# Patient Record
Sex: Male | Born: 1955 | Race: Black or African American | Hispanic: No | Smoking: Never smoker
Health system: Southern US, Community
[De-identification: ages and names within clinical notes are randomized; demographics above are authoritative.]

## PROBLEM LIST (undated history)

## (undated) DIAGNOSIS — E119 Type 2 diabetes mellitus without complications: Secondary | ICD-10-CM

## (undated) DIAGNOSIS — G4730 Sleep apnea, unspecified: Secondary | ICD-10-CM

## (undated) DIAGNOSIS — M109 Gout, unspecified: Secondary | ICD-10-CM

## (undated) DIAGNOSIS — I2510 Atherosclerotic heart disease of native coronary artery without angina pectoris: Secondary | ICD-10-CM

## (undated) DIAGNOSIS — I10 Essential (primary) hypertension: Secondary | ICD-10-CM

## (undated) DIAGNOSIS — E785 Hyperlipidemia, unspecified: Secondary | ICD-10-CM

## (undated) DIAGNOSIS — E079 Disorder of thyroid, unspecified: Secondary | ICD-10-CM

## (undated) DIAGNOSIS — M069 Rheumatoid arthritis, unspecified: Secondary | ICD-10-CM

## (undated) DIAGNOSIS — I639 Cerebral infarction, unspecified: Secondary | ICD-10-CM

## (undated) DIAGNOSIS — I219 Acute myocardial infarction, unspecified: Secondary | ICD-10-CM

## (undated) DIAGNOSIS — I509 Heart failure, unspecified: Secondary | ICD-10-CM

## (undated) HISTORY — PX: CORONARY ANGIOPLASTY WITH STENT PLACEMENT: SHX49

## (undated) HISTORY — DX: Sleep apnea, unspecified: G47.30

## (undated) HISTORY — DX: Hyperlipidemia, unspecified: E78.5

## (undated) HISTORY — PX: CIRCUMCISION: SUR203

## (undated) HISTORY — PX: TONSILLECTOMY: SUR1361

## (undated) HISTORY — DX: Atherosclerotic heart disease of native coronary artery without angina pectoris: I25.10

## (undated) HISTORY — DX: Heart failure, unspecified: I50.9

## (undated) HISTORY — DX: Disorder of thyroid, unspecified: E07.9

---

## 2003-12-12 ENCOUNTER — Other Ambulatory Visit: Payer: Self-pay

## 2004-07-28 ENCOUNTER — Emergency Department: Payer: Self-pay | Admitting: Emergency Medicine

## 2004-10-23 ENCOUNTER — Ambulatory Visit: Payer: Self-pay | Admitting: Family Medicine

## 2004-12-16 ENCOUNTER — Emergency Department: Payer: Self-pay | Admitting: General Practice

## 2005-03-15 ENCOUNTER — Emergency Department: Payer: Self-pay | Admitting: Emergency Medicine

## 2005-05-26 ENCOUNTER — Emergency Department: Payer: Self-pay | Admitting: General Practice

## 2005-07-05 ENCOUNTER — Emergency Department: Payer: Self-pay | Admitting: Emergency Medicine

## 2005-09-09 ENCOUNTER — Emergency Department: Payer: Self-pay | Admitting: Emergency Medicine

## 2005-09-28 ENCOUNTER — Emergency Department: Payer: Self-pay | Admitting: Emergency Medicine

## 2005-10-12 ENCOUNTER — Emergency Department: Payer: Self-pay | Admitting: General Practice

## 2005-10-14 ENCOUNTER — Emergency Department: Payer: Self-pay | Admitting: General Practice

## 2006-01-14 ENCOUNTER — Emergency Department: Payer: Self-pay | Admitting: Emergency Medicine

## 2006-01-14 ENCOUNTER — Other Ambulatory Visit: Payer: Self-pay

## 2006-02-13 ENCOUNTER — Ambulatory Visit: Payer: Self-pay | Admitting: Family Medicine

## 2006-03-05 ENCOUNTER — Emergency Department: Payer: Self-pay | Admitting: Emergency Medicine

## 2006-03-21 ENCOUNTER — Other Ambulatory Visit: Payer: Self-pay

## 2006-03-21 ENCOUNTER — Inpatient Hospital Stay: Payer: Self-pay | Admitting: Internal Medicine

## 2006-08-03 ENCOUNTER — Emergency Department: Payer: Self-pay | Admitting: Emergency Medicine

## 2007-03-01 ENCOUNTER — Inpatient Hospital Stay: Payer: Self-pay | Admitting: Unknown Physician Specialty

## 2007-03-24 ENCOUNTER — Ambulatory Visit: Payer: Self-pay | Admitting: Internal Medicine

## 2007-04-09 ENCOUNTER — Ambulatory Visit: Payer: Self-pay | Admitting: Internal Medicine

## 2007-07-20 ENCOUNTER — Emergency Department: Payer: Self-pay | Admitting: Emergency Medicine

## 2007-08-30 ENCOUNTER — Other Ambulatory Visit: Payer: Self-pay

## 2007-08-30 ENCOUNTER — Emergency Department: Payer: Self-pay | Admitting: Emergency Medicine

## 2007-09-30 ENCOUNTER — Emergency Department: Payer: Self-pay | Admitting: Emergency Medicine

## 2007-11-06 ENCOUNTER — Emergency Department: Payer: Self-pay | Admitting: Emergency Medicine

## 2008-03-16 ENCOUNTER — Other Ambulatory Visit: Payer: Self-pay

## 2008-03-16 ENCOUNTER — Emergency Department: Payer: Self-pay | Admitting: Emergency Medicine

## 2008-09-13 ENCOUNTER — Emergency Department: Payer: Self-pay | Admitting: Emergency Medicine

## 2009-08-05 ENCOUNTER — Emergency Department: Payer: Self-pay | Admitting: Emergency Medicine

## 2009-11-12 ENCOUNTER — Emergency Department: Payer: Self-pay | Admitting: Emergency Medicine

## 2010-03-28 ENCOUNTER — Emergency Department: Payer: Self-pay | Admitting: Emergency Medicine

## 2010-06-05 ENCOUNTER — Emergency Department: Payer: Self-pay | Admitting: Emergency Medicine

## 2010-07-31 ENCOUNTER — Emergency Department: Payer: Self-pay | Admitting: Emergency Medicine

## 2010-10-01 ENCOUNTER — Inpatient Hospital Stay: Payer: Self-pay | Admitting: Specialist

## 2011-01-28 ENCOUNTER — Emergency Department: Payer: Self-pay | Admitting: Emergency Medicine

## 2011-05-21 ENCOUNTER — Observation Stay: Payer: Self-pay | Admitting: Internal Medicine

## 2011-05-24 ENCOUNTER — Ambulatory Visit: Payer: Self-pay | Admitting: Internal Medicine

## 2011-07-01 ENCOUNTER — Observation Stay: Payer: Self-pay | Admitting: Internal Medicine

## 2011-08-28 ENCOUNTER — Observation Stay: Payer: Self-pay | Admitting: Specialist

## 2011-08-28 LAB — TROPONIN I
Troponin-I: <0.02 ng/mL
Troponin-I: <0.02 ng/mL

## 2011-08-28 LAB — COMPREHENSIVE METABOLIC PANEL
Albumin: 3.3 g/dL — ABNORMAL LOW (ref 3.4–5.0)
Alkaline Phosphatase: 106 U/L (ref 50–136)
Anion Gap: 7 (ref 7–16)
Co2: 28 mmol/L (ref 21–32)
Creatinine: 1.12 mg/dL (ref 0.60–1.30)
EGFR (Non-African Amer.): >60
Potassium: 4 mmol/L (ref 3.5–5.1)
Sodium: 138 mmol/L (ref 136–145)

## 2011-08-28 LAB — PROTIME-INR
INR: 0.9
Prothrombin Time: 12.6 secs (ref 11.5–14.7)

## 2011-08-28 LAB — MAGNESIUM: Magnesium: 1.6 mg/dL — ABNORMAL LOW

## 2011-08-28 LAB — CBC
HCT: 38.5 % — ABNORMAL LOW (ref 40.0–52.0)
MCV: 87 fL (ref 80–100)

## 2011-08-28 LAB — CK TOTAL AND CKMB (NOT AT ARMC): CK-MB: 2.4 ng/mL (ref 0.5–3.6)

## 2011-08-28 LAB — APTT: Activated PTT: 26.9 secs (ref 23.6–35.9)

## 2011-08-29 LAB — CBC WITH DIFFERENTIAL/PLATELET
Basophil %: 0.4 %
Eosinophil %: 3.3 %
Lymphocyte %: 25 %
MCH: 28.9 pg (ref 26.0–34.0)
Monocyte #: 0.6 10*3/uL (ref 0.0–0.7)
Neutrophil %: 61.5 %
Platelet: 213 10*3/uL (ref 150–440)

## 2011-08-29 LAB — BASIC METABOLIC PANEL
Calcium, Total: 8.4 mg/dL — ABNORMAL LOW (ref 8.5–10.1)
Co2: 28 mmol/L (ref 21–32)
EGFR (African American): >60
EGFR (Non-African Amer.): >60
Glucose: 85 mg/dL (ref 65–99)
Osmolality: 288 (ref 275–301)
Potassium: 3.7 mmol/L (ref 3.5–5.1)

## 2011-08-29 LAB — HEMOGLOBIN A1C: Hemoglobin A1C: 10 % — ABNORMAL HIGH (ref 4.2–6.3)

## 2011-08-29 LAB — CK TOTAL AND CKMB (NOT AT ARMC): CK, Total: 194 U/L (ref 35–232)

## 2011-08-29 LAB — TROPONIN I: Troponin-I: <0.02 ng/mL

## 2011-09-05 ENCOUNTER — Encounter: Payer: Self-pay | Admitting: Internal Medicine

## 2011-09-07 ENCOUNTER — Encounter: Payer: Self-pay | Admitting: Internal Medicine

## 2011-10-08 ENCOUNTER — Encounter: Payer: Self-pay | Admitting: Internal Medicine

## 2011-10-22 ENCOUNTER — Emergency Department: Payer: Self-pay | Admitting: Emergency Medicine

## 2011-10-23 ENCOUNTER — Emergency Department: Payer: Self-pay | Admitting: Emergency Medicine

## 2011-11-07 ENCOUNTER — Encounter: Payer: Self-pay | Admitting: Internal Medicine

## 2011-12-08 ENCOUNTER — Encounter: Payer: Self-pay | Admitting: Internal Medicine

## 2012-01-07 ENCOUNTER — Encounter: Payer: Self-pay | Admitting: Internal Medicine

## 2012-02-15 ENCOUNTER — Emergency Department: Payer: Self-pay | Admitting: Emergency Medicine

## 2012-06-24 ENCOUNTER — Encounter (HOSPITAL_COMMUNITY): Payer: Self-pay | Admitting: Physical Medicine and Rehabilitation

## 2012-06-24 ENCOUNTER — Emergency Department (HOSPITAL_COMMUNITY)
Admission: EM | Admit: 2012-06-24 | Discharge: 2012-06-24 | Disposition: A | Discharge status: Home / Self Care | Payer: No Typology Code available for payment source | Attending: Emergency Medicine | Admitting: Emergency Medicine

## 2012-06-24 ENCOUNTER — Emergency Department (HOSPITAL_COMMUNITY): Payer: No Typology Code available for payment source

## 2012-06-24 DIAGNOSIS — I252 Old myocardial infarction: Secondary | ICD-10-CM | POA: Insufficient documentation

## 2012-06-24 DIAGNOSIS — E119 Type 2 diabetes mellitus without complications: Secondary | ICD-10-CM | POA: Insufficient documentation

## 2012-06-24 DIAGNOSIS — Y92410 Unspecified street and highway as the place of occurrence of the external cause: Secondary | ICD-10-CM | POA: Insufficient documentation

## 2012-06-24 DIAGNOSIS — Z8639 Personal history of other endocrine, nutritional and metabolic disease: Secondary | ICD-10-CM | POA: Insufficient documentation

## 2012-06-24 DIAGNOSIS — M069 Rheumatoid arthritis, unspecified: Secondary | ICD-10-CM | POA: Insufficient documentation

## 2012-06-24 DIAGNOSIS — S39012A Strain of muscle, fascia and tendon of lower back, initial encounter: Secondary | ICD-10-CM

## 2012-06-24 DIAGNOSIS — Z862 Personal history of diseases of the blood and blood-forming organs and certain disorders involving the immune mechanism: Secondary | ICD-10-CM | POA: Insufficient documentation

## 2012-06-24 DIAGNOSIS — Z794 Long term (current) use of insulin: Secondary | ICD-10-CM | POA: Insufficient documentation

## 2012-06-24 DIAGNOSIS — Y9389 Activity, other specified: Secondary | ICD-10-CM | POA: Insufficient documentation

## 2012-06-24 DIAGNOSIS — I10 Essential (primary) hypertension: Secondary | ICD-10-CM | POA: Insufficient documentation

## 2012-06-24 DIAGNOSIS — S335XXA Sprain of ligaments of lumbar spine, initial encounter: Secondary | ICD-10-CM | POA: Insufficient documentation

## 2012-06-24 DIAGNOSIS — Z79899 Other long term (current) drug therapy: Secondary | ICD-10-CM | POA: Insufficient documentation

## 2012-06-24 HISTORY — DX: Essential (primary) hypertension: I10

## 2012-06-24 HISTORY — DX: Type 2 diabetes mellitus without complications: E11.9

## 2012-06-24 HISTORY — DX: Rheumatoid arthritis, unspecified: M06.9

## 2012-06-24 HISTORY — DX: Acute myocardial infarction, unspecified: I21.9

## 2012-06-24 HISTORY — DX: Gout, unspecified: M10.9

## 2012-06-24 MED ORDER — HYDROCODONE-ACETAMINOPHEN 5-325 MG PO TABS: 1.0000 | ORAL_TABLET | Freq: Four times a day (QID) | ORAL | Status: DC | PRN
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
## 2012-09-23 ENCOUNTER — Emergency Department: Payer: Self-pay | Admitting: Unknown Physician Specialty

## 2012-09-23 LAB — URINALYSIS, COMPLETE
Bacteria: NONE SEEN
Bilirubin,UR: NEGATIVE
Blood: NEGATIVE
Ketone: NEGATIVE
Leukocyte Esterase: NEGATIVE
RBC,UR: <1 /HPF (ref 0–5)
Specific Gravity: 1.015 (ref 1.003–1.030)
WBC UR: <1 /HPF (ref 0–5)

## 2012-09-23 LAB — COMPREHENSIVE METABOLIC PANEL
BUN: 12 mg/dL (ref 7–18)
Calcium, Total: 8.4 mg/dL — ABNORMAL LOW (ref 8.5–10.1)
Co2: 28 mmol/L (ref 21–32)
Creatinine: 1.03 mg/dL (ref 0.60–1.30)
EGFR (African American): >60
EGFR (Non-African Amer.): >60
Glucose: 235 mg/dL — ABNORMAL HIGH (ref 65–99)
Osmolality: 285 (ref 275–301)
SGOT(AST): 21 U/L (ref 15–37)
SGPT (ALT): 28 U/L (ref 12–78)
Sodium: 139 mmol/L (ref 136–145)
Total Protein: 7.1 g/dL (ref 6.4–8.2)

## 2012-09-23 LAB — CBC
HCT: 40 % (ref 40.0–52.0)
HGB: 13.2 g/dL (ref 13.0–18.0)
MCH: 27.6 pg (ref 26.0–34.0)
RBC: 4.78 10*6/uL (ref 4.40–5.90)
RDW: 14.6 % — ABNORMAL HIGH (ref 11.5–14.5)
WBC: 6.8 10*3/uL (ref 3.8–10.6)

## 2012-09-23 LAB — CK TOTAL AND CKMB (NOT AT ARMC): CK, Total: 309 U/L — ABNORMAL HIGH (ref 35–232)

## 2014-03-05 ENCOUNTER — Emergency Department: Payer: Self-pay | Admitting: Emergency Medicine

## 2014-05-03 ENCOUNTER — Emergency Department: Payer: Self-pay | Admitting: Emergency Medicine

## 2014-05-03 LAB — CBC
HCT: 41.9 % (ref 40.0–52.0)
HGB: 13.6 g/dL (ref 13.0–18.0)
MCH: 28.3 pg (ref 26.0–34.0)
MCHC: 32.5 g/dL (ref 32.0–36.0)
MCV: 87 fL (ref 80–100)
Platelet: 263 10*3/uL (ref 150–440)
RBC: 4.82 10*6/uL (ref 4.40–5.90)
RDW: 13.7 % (ref 11.5–14.5)
WBC: 7 10*3/uL (ref 3.8–10.6)

## 2014-05-03 LAB — COMPREHENSIVE METABOLIC PANEL
ALBUMIN: 3.4 g/dL (ref 3.4–5.0)
ALK PHOS: 93 U/L
Anion Gap: 6 — ABNORMAL LOW (ref 7–16)
BUN: 13 mg/dL (ref 7–18)
Bilirubin,Total: 0.2 mg/dL (ref 0.2–1.0)
CALCIUM: 8.4 mg/dL — AB (ref 8.5–10.1)
CREATININE: 1.15 mg/dL (ref 0.60–1.30)
Chloride: 107 mmol/L (ref 98–107)
Co2: 27 mmol/L (ref 21–32)
EGFR (African American): >60
EGFR (Non-African Amer.): >60
GLUCOSE: 155 mg/dL — AB (ref 65–99)
Osmolality: 283 (ref 275–301)
POTASSIUM: 4.2 mmol/L (ref 3.5–5.1)
SGOT(AST): 27 U/L (ref 15–37)
SGPT (ALT): 33 U/L
SODIUM: 140 mmol/L (ref 136–145)
Total Protein: 7.3 g/dL (ref 6.4–8.2)

## 2014-05-03 LAB — SALICYLATE LEVEL

## 2014-05-03 LAB — ETHANOL: Ethanol: <3 mg/dL

## 2014-05-03 LAB — ACETAMINOPHEN LEVEL

## 2014-10-30 NOTE — Consult Note (Signed)
PATIENT NAME:  Derrick Barajas, Derrick Barajas MR#:  [id number] DATE OF BIRTH:  1956/03/07  DATE OF CONSULTATION:  05/03/2014  CONSULTING PHYSICIAN:  Audery Amel, MD   IDENTIFYING INFORMATION AND REASON FOR CONSULT:  A 59 year old man came to the Emergency Room with a complaint of being frustrated and depressed.   HISTORY OF PRESENT ILLNESS: Information obtained from the patient and the chart. The patient reports that his mood feels down, nervous and anxious much of the time. He has been feeling "unappreciated" by everyone around him, especially for the last week and the last couple of days in particular.  Two major stresses he reports are that his extended family keeps pestering him to give them free cab rides and that his girlfriend and he have a frustrating relationship around finances in the house. He feels like she does not appreciate him either. He denies that he has had any suicidal ideation. Denies that he has had any sleep problems. Denies that he has had any appetite problems. He is not reporting any specific physical symptoms. He has still been able to do his work regularly. No new health problems. Not currently taking any medicine for depression.   PAST PSYCHIATRIC HISTORY: Many years ago, by his estimate more than 10, he had a suicide attempt by [place] the railroad tracks.  He was picked up and taken to [facility] and prescribed antidepressants, but cannot remember what they were. Has not seen an outpatient psychiatrist in years. No further suicide attempts or hospitalizations since then.   SUBSTANCE ABUSE HISTORY: Denies that he drinks or abuses any drugs or has had a past history of drinking or abusing any drugs.   FAMILY HISTORY: His mother had depression which was bad enough to be hospitalized.   PAST MEDICAL HISTORY: Several medical problems including diabetes requiring insulin, high blood pressure, gout, congestive heart failure. He has had a heart attack in the past as well.    SOCIAL HISTORY: The patient lives with his girlfriend. They have been together for about 10 years.  They still do not share all their finances and he complains that she takes his groceries but will not let him touch any of hers. He does not have any children of his own.  He works as a Scientist, physiological and feels like his family do not understand that he cannot be driving them around for free when he is at work.   LABORATORY RESULTS: Glucose elevated at 155, calcium low at 8.4. Chemistry is otherwise unremarkable. CBC all normal. Acetaminophen and salicylates negative. We do not have a urinalysis or drug screen back.    VITAL SIGNS: Blood pressure 156/100, respirations 18, pulse 88, temperature 97.5.   REVIEW OF SYSTEMS:  Depressed mood. Frustration. No suicidal ideation. Occasional thoughts of hitting someone without any plan or intent. Physically, no complaints. Full 9-point review of systems negative.   MEDICATIONS: Aspirin 81 mg twice a day, Accupril 20 mg once a day, nitroglycerin sublingual tablets as needed for chest pain, Humalog on a sliding scale 3 times a day, Lantus insulin 110 units at night, metformin 1000 mg twice a day, loratadine 10 mg once a day, Effient 10 mg once a day, metoprolol 50 mg twice a day, amlodipine 10 mg once a day, Lasix 20 mg a day, docusate 100 mg once a day, Patanol eye drops as needed.   MENTAL STATUS EXAMINATION:  Reasonably well-groomed gentleman who looks his stated age, cooperative with the interview. Eye contact good. Psychomotor  activity normal. Speech normal rate, tone and volume perhaps a little bit quiet and nervous.  Affect, a little bit tearful, but not near full.  He shows appropriate reactivity.  Thoughts are lucid.  No evidence of loosening of associations or delusions. Denies auditory or visual hallucinations. Denies suicidal or homicidal ideation. Can recall 3/3 objects immediately and 2/3 at 3 minutes. Judgment and insight intact. Normal intelligence.    ASSESSMENT: A 59 year old man who has depressive symptoms, possibly major depression versus adjustment disorder. The fact that he is bothered enough to come into the hospital and that he had a past history of significant depression makes me prefer the diagnosis of major depression. The patient at this point is not acutely dangerous, does not require hospital level treatment.   TREATMENT PLAN: I proposed to him starting Celexa 20 mg once a day. Side effects and purposes of medicine discussed.   He is agreeable to the plan.  He will be given a referral to go to [facility] to follow up with them.   DIAGNOSIS, PRINCIPAL AND PRIMARY:  AXIS I: Major depression, moderate, recurrent.   SECONDARY DIAGNOSES:  AXIS I: No further.  AXIS II: No diagnosis.  AXIS III: High blood pressure, diabetes, gout, congestive heart failure, status post myocardial infarction, coronary artery disease.  AXIS V: Global Assessment of Functioning at time of evaluation 55.      ____________________________ Audery Amel, MD jtc:DT D: 05/03/2014 13:16:43 ET T: 05/03/2014 14:13:29 ET JOB#: [id number]  cc: Audery Amel, MD, <Dictator> Audery Amel MD ELECTRONICALLY SIGNED 05/15/2014 14:51

## 2014-10-31 NOTE — Consult Note (Signed)
PATIENT NAME:  Derrick Barajas, Derrick Barajas MR#:  [id number] DATE OF BIRTH:  10-Apr-1956  DATE OF CONSULTATION:  08/29/2011  REFERRING PHYSICIAN:  [facility] / Malachy Moan, MD CONSULTING PHYSICIAN:  Jakyia Gaccione D. Iisha Soyars, MD  REASON FOR CONSULTATION: Symptoms of left-sided chest pain, unstable angina, and shortness of breath.   HISTORY OF PRESENT ILLNESS: The patient is a 59 year old black male with history of coronary artery disease status post angioplasty and stent to the LAD in November 2012. He has hypertension, hyperlipidemia, diabetes, and obesity who complained of left-sided chest pain which started earlier. He reports symptoms lasted about 10 minutes and has had several nitroglycerins which seemed like they did not help. He had radiation of pain to the left arm with numbness. Symptoms have improved. He describes pressure sensation with throbbing. He reports that since his cardiac procedure, he has had intermittent symptoms. He states while at dinner he started having chest pain and may have had a syncopal episode and was brought to the Emergency Room for evaluation.  REVIEW OF SYSTEMS: Possible blackout spells. Possible syncope. No nausea or vomiting. No fever, no chills, and no sweats. No weight loss and no weight gain. No hemoptysis or hematemesis. No bright red blood per rectum.   PAST MEDICAL HISTORY:  1. Hypertension. 2. Hyperlipidemia. 3. Coronary artery disease.  4. Diabetes.   FAMILY HISTORY: Diabetes and coronary artery disease.   SOCIAL HISTORY: Denies smoking or alcohol consumption. Works as a Media planner.   MEDICATIONS:  1. Accupril 20 mg daily.  2. Aspirin 81 mg a day.  3. Plavix 75 mg a day.  4. Dulcolax 100 mg daily.  5. Lantus 110 units subcutaneous at bedtime.  6. Metoprolol tartrate 50 mg twice a day.  7. Nitroglycerin sublingual p.r.n.  8. NovoLog 18 units three times daily.  ALLERGIES: Solu-Medrol and sulfa.   PHYSICAL EXAMINATION:   VITAL SIGNS: Blood pressure  120/60, pulse 75, respiratory rate 18, afebrile.   HEENT: Normocephalic, atraumatic. Pupils are equal and reactive to light.   NECK: Supple. No significant jugular venous distention, bruits or adenopathy.   LUNGS: Clear to auscultation and percussion. No significant wheeze, rhonchi, or rales.   HEART: Regular rate and rhythm. Positive S4. Systolic ejection murmur at the apex.   ABDOMEN: Benign.   EXTREMITIES: Within normal limits.   NEUROLOGIC: Intact.   SKIN: Normal.   LABS/STUDIES: Glucose 267, BUN 12, creatinine 1.12, sodium 138, potassium 4.0. CK 55, MB 2.4, troponin 0.02. White count 5.9, hemoglobin 12.7, platelet count 226. INR negative.   Chest x-ray negative.   EKG: Normal sinus rhythm, nonspecific ST-T changes.   ASSESSMENT:  1. Unstable angina.  2. Angina.  3. Coronary artery disease.  4. Obesity.  5. Hypertension.  6. Hyperlipidemia.  7. Status post angioplasty and stent.   PLAN: Agree with admit. Rule out for myocardial infarction. Place on telemetry. Consider functional study. Continue blood pressure control. Continue ACE inhibitor. Continue statin therapy. Recommend weight loss and exercise. Continue diabetes management. Base further evaluation on results of functional study. ____________________________ Bobbie Stack Juliann Pares, MD ddc:slb D: 08/30/2011 09:29:00 ET T: 08/30/2011 10:06:14 ET JOB#: [id number]  cc: Derrick Lonigro D. Juliann Pares, MD, <Dictator> Alwyn Pea MD ELECTRONICALLY SIGNED 09/25/2011 10:04

## 2014-10-31 NOTE — Discharge Summary (Signed)
PATIENT NAME:  Derrick Barajas, Derrick Barajas MR#:  [id number] DATE OF BIRTH:  23-Mar-1956  DATE OF ADMISSION:  08/28/2011 DATE OF DISCHARGE:  08/29/2011  For a detailed note, please take a look at the history and physical done on admission by Dr. Auburn Bilberry.    DIAGNOSES AT DISCHARGE:  1. Chest pain, likely related to anxiety/panic attack. 2. History of coronary artery disease, status post stent placement. 3. Hypertension. 4. Diabetes. 5. Obesity.   DIET: Patient was discharged on a low sodium, American Diabetic Association diet.   ACTIVITY: As tolerated.   FOLLOW UP: Follow up with Dr. Juliann Pares next 1 to 2 weeks.   DISCHARGE MEDICATIONS:  1. Metoprolol tartrate 50 mg b.i.d.  2. Accupril 20 mg daily.  3. Aspirin 81 mg daily. 4. NovoLog 18 units t.i.d. with meals.  5. Lantus 110 units at bedtime.  6. Sublingual nitroglycerin as needed.  7. Plavix 75 mg daily.  8. Colace 100 mg daily.   LABORATORY, DIAGNOSTIC AND RADIOLOGICAL DATA: Pertinent studies done during the hospital course are as follows: Chest x-ray done on admission showing no acute cardiopulmonary disease. A stress test on the day after admission showing no evidence of any acute myocardial ischemia with normal LV function.   HOSPITAL COURSE: This is a 59 year old male with medical problems as mentioned above presented to the hospital secondary to chest pain.  1. Chest pain. Given the patient's history of diabetes, obesity, coronary disease with recent stent placement he was observed overnight on telemetry, had three sets of cardiac markers checked which were negative. He also underwent a stress test which did not show any evidence of acute myocardial ischemia. His chest pain was likely related to panic attack and anxiety. He was therefore discharged home on his maintenance medications.  2. Diabetes. Patient is likely noncompliant with his medications. His hemoglobin A1c was as high as 10. This likely needs to be further monitored by his  primary care physician. He is currently on NovoLog 18 units t.i.d. with meals and 110 of Lantus and he will resume that upon discharge.  3. Hypertension. He remained hemodynamically stable on his metoprolol tartrate and Accupril which he will resume upon discharge.  4. History of coronary artery disease status post stent placement. Patient is already on maintenance medications including aspirin, Plavix, beta blockers, and statin and he will resume that upon discharge.  5. CODE STATUS: Patient is a FULL CODE.   TIME SPENT: 35 minutes.  ____________________________ Rolly Pancake. Cherlynn Kaiser, MD vjs:cms D: 08/30/2011 15:18:56 ET T: 08/31/2011 08:50:02 ET JOB#: [id number]  cc: Rolly Pancake. Cherlynn Kaiser, MD, <Dictator> Dwayne D. Juliann Pares, MD Houston Siren MD ELECTRONICALLY SIGNED 08/31/2011 16:11

## 2014-10-31 NOTE — H&P (Signed)
PATIENT NAME:  Derrick Barajas, WURZER MR#:  [id number] DATE OF BIRTH:  [birth year]  DATE OF ADMISSION:  08/28/2011  PRIMARY CARE PHYSICIAN: [facility]  REFERRING PHYSICIAN: Malachy Moan, MD  CHIEF COMPLAINT: Left-sided chest pressure, shortness of breath.   HISTORY OF PRESENT ILLNESS: The patient is a 59 year old African American male with history of coronary artery disease who had a stent to the LAD in November 2012 and has hypertension, hyperlipidemia, and poorly controlled diabetes who presents to the ED with complaint of having left-sided chest pressure which he reports started earlier this morning. He reports the symptoms lasted about 10 minutes. He had to take two sublingual nitroglycerins. He also had some pain radiating to his left arm with numbness. Now his symptoms have resolved. He describes the pressure sensation as a throbbing type of sensation. He reports that since his cardiac catheterization he has had intermittent symptoms with pain but usually last a minute or so. This time it was longer. Therefore, he decided to come to the ED. In the ER, his EKG shows no acute abnormality. His cardiac enzymes are negative. He otherwise denies any orthopnea or nocturnal dyspnea, no lower extremity swelling, no calf pain, and no fevers, chills, or cough.   PAST MEDICAL HISTORY:  1. Hypertension.  2. Hyperlipidemia.  3. History of coronary artery disease with stent to the LAD, placed in November 2012.  4. Diabetes.  DRUG ALLERGIES: Solu-Medrol and sulfa.   CURRENT MEDICATIONS:  1. Accupril 20 mg daily.  2. Aspirin 81 mg one tab p.o. daily.  3. Plavix 75 mg p.o. daily.  4. Docusate 100 mg one tab p.o. daily.  5. Lantus 110 units subcutaneous at bedtime.  6. Metoprolol tartrate 50 mg one tab p.o. twice a day.  7. Nitroglycerin 0.4 mg sublingual p.r.n. chest pain. 8. NovoLog 18 units three times daily before a meal.   SOCIAL HISTORY: The patient does not smoke and does not drink; no drugs.    FAMILY HISTORY: Both mother and father had diabetes and died from a heart problem.  REVIEW OF SYSTEMS: CONSTITUTIONAL: Denies any fevers, fatigue, or weakness. No pain. No weight loss or weight gain. EYES: No blurred or double vision. No pain. No redness. No inflammation. No glaucoma. No cataracts. ENT: No tinnitus. No ear pain or hearing loss. No seasonal or year-round allergies. No epistaxis. No nasal discharge. No snoring. No postnasal drip. No difficulty swallowing. RESPIRATORY: No cough. No wheezing. No hemoptysis. No chronic obstructive pulmonary disease. No tuberculosis. No pneumonia. CARDIOVASCULAR: Chest pressure as above. No orthopnea. No edema. No arrhythmia. No palpitations. No syncope. GASTROINTESTINAL: No nausea, vomiting, or diarrhea. No abdominal pain. No hematemesis. No melena. No ulcer. No gastroesophageal reflux disease. No changes in bowel habits. GENITOURINARY: Denies any dysuria, hematuria, renal calculus, or frequency. ENDOCRINE: Denies any polyuria, nocturia, or thyroid problems. Denies any increased sweating, heat or cold intolerance. HEME/LYMPH: Denies any anemia, easy bruisability, or bleeding. SKIN: No acne. No rash. No changes in mole, hair, or skin. MUSCULOSKELETAL: Denies any pain in the neck, back, or shoulder. NEURO: No numbness. No cerebrovascular accident. No transient ischemic attack. PSYCHIATRIC: No anxiety. No insomnia. No ADD.   PHYSICAL EXAMINATION:   VITAL SIGNS: Temperature 98, pulse 76, respirations 20, blood pressure 118/58, O2 saturation 97%.   GENERAL: The patient appears comfortable, in no acute distress.   HEENT: Pupils are equally round and reactive to light and accommodation. Extraocular movements intact. Oropharynx is clear.  No conjunctival pallor. No scleral icterus.  NECK: No thyromegaly. No carotid bruits. No JVD.  HEART: Regular rate and rhythm. No murmurs, rubs, clicks, or gallops. PMI is not displaced.   LUNGS: Clear to auscultation  bilaterally without any rales, rhonchi, or wheezing.   ABDOMEN: Soft, nontender, and nondistended. Positive bowel sounds x4.   EXTREMITIES: No clubbing, cyanosis, or edema.   NEUROLOGIC: Awake, alert, and oriented x3. No focal deficits.   SKIN: No rash.   LYMPHATICS: No lymph nodes are palpable.   MUSCULOSKELETAL: No erythema or swelling.   VASCULAR: Good DP and PT pulses.   PERTINENT LABS AND EVALUATIONS: Glucose 267, BUN 12, creatinine 1.12, sodium 138, potassium 4.0, chloride 103, CO2 28, calcium 8.4. Magnesium slightly low at 1.6. LFTs showed albumin of 3.3. CPK was elevated to 55. CK-MB 2.4. Troponin less than 0.02. WBC 5.9, hemoglobin 12.7, and platelet count was 229. INR 0.9.  Chest x-ray shows no acute abnormality.   EKG shows normal sinus rhythm without any acute ST-T wave changes.   ASSESSMENT AND PLAN: The patient is a 59 year old African American male with history of coronary artery disease with stent in November 2012. The patient presents with recurrent chest pain. He was admitted in December with similar symptoms.  1. Chest pain, possible cardiac at this time: The patient does have a history of coronary artery disease. EKG and enzymes are unrevealing. We will consult Dr. Juliann Pares for any further recommendations. I will go ahead and schedule him for stress test. Continue his aspirin, beta blocker, and p.r.n. nitroglycerin.  2. Hypertension: We will continue Accupril and metoprolol, follow blood pressure, and adjust his regimen as needed.  3. Diabetes, type 2, which is poorly controlled: We will continue Lantus premeal, NovoLog, and sliding scale insulin.  4. Hyperlipidemia: Currently not on any treatment. We will check a fasting lipid panel for the morning.     5. Miscellaneous:  We will place him on Lovenox for deep vein thrombosis prophylaxis.  TIME SPENT: 35 minutes. ____________________________ Lacie Scotts Allena Katz, MD shp:slb D: 08/28/2011 15:00:35 ET T: 08/28/2011  15:23:25 ET JOB#: [id number]  cc: Ty Buntrock H. Allena Katz, MD, <Dictator> [facility] Charise Carwin MD ELECTRONICALLY SIGNED 09/14/2011 7:49

## 2014-11-11 ENCOUNTER — Encounter (INDEPENDENT_AMBULATORY_CARE_PROVIDER_SITE_OTHER): Payer: Self-pay

## 2015-02-09 ENCOUNTER — Emergency Department
Admission: EM | Admit: 2015-02-09 | Discharge: 2015-02-09 | Disposition: A | Discharge status: Home / Self Care | Payer: Medicaid Other | Attending: Emergency Medicine | Admitting: Emergency Medicine

## 2015-02-09 ENCOUNTER — Emergency Department: Payer: Medicaid Other

## 2015-02-09 ENCOUNTER — Other Ambulatory Visit: Payer: Self-pay

## 2015-02-09 DIAGNOSIS — Z79899 Other long term (current) drug therapy: Secondary | ICD-10-CM | POA: Diagnosis not present

## 2015-02-09 DIAGNOSIS — R609 Edema, unspecified: Secondary | ICD-10-CM | POA: Diagnosis not present

## 2015-02-09 DIAGNOSIS — R079 Chest pain, unspecified: Secondary | ICD-10-CM | POA: Diagnosis present

## 2015-02-09 DIAGNOSIS — Z9861 Coronary angioplasty status: Secondary | ICD-10-CM | POA: Diagnosis not present

## 2015-02-09 DIAGNOSIS — Z794 Long term (current) use of insulin: Secondary | ICD-10-CM | POA: Diagnosis not present

## 2015-02-09 DIAGNOSIS — I10 Essential (primary) hypertension: Secondary | ICD-10-CM | POA: Insufficient documentation

## 2015-02-09 DIAGNOSIS — I252 Old myocardial infarction: Secondary | ICD-10-CM | POA: Diagnosis not present

## 2015-02-09 DIAGNOSIS — E119 Type 2 diabetes mellitus without complications: Secondary | ICD-10-CM | POA: Diagnosis not present

## 2015-02-09 HISTORY — DX: Cerebral infarction, unspecified: I63.9

## 2015-02-09 LAB — COMPREHENSIVE METABOLIC PANEL
ALT: 47 U/L (ref 17–63)
AST: 36 U/L (ref 15–41)
Albumin: 3.7 g/dL (ref 3.5–5.0)
Alkaline Phosphatase: 91 U/L (ref 38–126)
Anion gap: 8 (ref 5–15)
BUN: 14 mg/dL (ref 6–20)
CO2: 25 mmol/L (ref 22–32)
Calcium: 8.8 mg/dL — ABNORMAL LOW (ref 8.9–10.3)
Chloride: 105 mmol/L (ref 101–111)
Creatinine, Ser: 0.96 mg/dL (ref 0.61–1.24)
GFR calc Af Amer: >60 mL/min (ref >60)
GFR calc non Af Amer: >60 mL/min (ref >60)
Glucose, Bld: 178 mg/dL — ABNORMAL HIGH (ref 65–99)
Potassium: 3.9 mmol/L (ref 3.5–5.1)
Sodium: 138 mmol/L (ref 135–145)
Total Bilirubin: 0.4 mg/dL (ref 0.3–1.2)
Total Protein: 6.8 g/dL (ref 6.5–8.1)

## 2015-02-09 LAB — CBC WITH DIFFERENTIAL/PLATELET
Basophils Absolute: 0 10*3/uL (ref 0–0.1)
Basophils Relative: 1 %
Eosinophils Absolute: 0.2 10*3/uL (ref 0–0.7)
Eosinophils Relative: 4 %
HCT: 39.9 % — ABNORMAL LOW (ref 40.0–52.0)
Hemoglobin: 13.4 g/dL (ref 13.0–18.0)
Lymphocytes Relative: 27 %
Lymphs Abs: 1.4 10*3/uL (ref 1.0–3.6)
MCH: 28.6 pg (ref 26.0–34.0)
MCHC: 33.6 g/dL (ref 32.0–36.0)
MCV: 85 fL (ref 80.0–100.0)
Monocytes Absolute: 0.5 10*3/uL (ref 0.2–1.0)
Monocytes Relative: 11 %
Neutro Abs: 2.8 10*3/uL (ref 1.4–6.5)
Neutrophils Relative %: 57 %
Platelets: 213 10*3/uL (ref 150–440)
RBC: 4.7 MIL/uL (ref 4.40–5.90)
RDW: 13.6 % (ref 11.5–14.5)
WBC: 5 10*3/uL (ref 3.8–10.6)

## 2015-02-09 LAB — TROPONIN I
Troponin I: <0.03 ng/mL (ref <0.031)
Troponin I: <0.03 ng/mL (ref <0.031)

## 2015-02-09 LAB — APTT: APTT: 25 s (ref 24–36)

## 2015-02-09 LAB — PROTIME-INR
INR: 1.02
Prothrombin Time: 13.6 seconds (ref 11.4–15.0)

## 2015-02-09 NOTE — ED Provider Notes (Signed)
-----------------------------------------   3:10 PM on 02/09/2015 -----------------------------------------  This patient was primarily seen by nurse practitioner Renford Dills. I was available throughout his stay for consultation and Ms. Hyacinth Meeker and I discussed the case including the plan.  This patient has a cardiac history. He has had chest pain starting at 8:30 this morning.  After his initial troponin level, which was negative, Ms. Hyacinth Meeker spoke with the patient's cardiologist. They advise a second troponin and if negative the patient may be discharged. They will see him tomorrow in their office.   ED ECG REPORT I, Felix Pratt W, the attending physician, personally viewed and interpreted this ECG contemporaneously at 10:10 AM.   Date: 02/09/2015  EKG Time: 9:56 AM  Rate: 85  Rhythm: Normal sinus rhythm  Axis: Normal  Intervals: Normal  ST&T Change: nonspecific T-wave abnormality-some flat T waves in lead 3 and lead V5 through V6   Darien Ramus, MD 02/09/15 1513

## 2015-02-09 NOTE — ED Provider Notes (Signed)
[facility] Emergency Department Provider Note  ____________________________________________  Time seen: Approximately 10:28 AM  I have reviewed the triage vital signs and the nursing notes.   HISTORY  Chief Complaint Chest Pain   HPI Derrick Barajas is a 59 y.o. male with previous history of MI 2 years ago as well as with x 1 cardiac stent, presents to the ER with onset of chest pain at 8:30 this morning. Patient reports that chest pain onset was while sitting still, states chest pain started in left chest as a stabbing chest pain that radiated into his left arm that felt like a tingling sensation. With some nausea and weakness. He reports pain at peak was 7 out of 10 in last for 10 minutes. Patient states that pain "eased up " after EMS administered sublingual nitroglycerin. Patient states that he now has a 2 out of 10 intermittent stabbing left chest pain. Patient reports that this is similar to his previous MI. 324 mg aspirin administered by EMS.   Patient reports that he occasionally experiences chest pain when anxious, but states this was different only as it lasted longer. States that pain normally lasts only 1-2 minutes and quickly goes away. States he has been experiencing large amounts of stress but states that is chronic.   Patient denies recent sickness. Denies fever, fall or injury.    Past Medical History  Diagnosis Date  . MI (myocardial infarction)   . Hypertension   . Diabetes mellitus without complication   . Gout   . RA (rheumatoid arthritis)   . Stroke     left facal droop  Angina   There are no active problems to display for this patient.   No past surgical history on file.  Current Outpatient Rx  Name  Route  Sig  Dispense  Refill  . amLODipine (NORVASC) 10 MG tablet   Oral   Take 10 mg by mouth daily.         . furosemide (LASIX) 20 MG tablet   Oral   Take 20 mg by mouth 2 (two) times daily.         .           .  insulin glargine (LANTUS) 100 UNIT/ML injection   Subcutaneous   Inject 110-200 Units into the skin at bedtime.         . insulin lispro (HUMALOG) 100 UNIT/ML injection   Subcutaneous   Inject 12 Units into the skin 3 (three) times daily before meals.         . metoprolol succinate (TOPROL-XL) 50 MG 24 hr tablet   Oral   Take 50 mg by mouth 2 (two) times daily. Take with or immediately following a meal.         . prasugrel (EFFIENT) 10 MG TABS   Oral   Take by mouth.         . quinapril (ACCUPRIL) 20 MG tablet   Oral   Take 20 mg by mouth at bedtime.         . rosuvastatin (CRESTOR) 40 MG tablet   Oral   Take 40 mg by mouth daily.           Allergies Iodine  No family history on file.  Social History History  Substance Use Topics  . Smoking status: Never Smoker   . Smokeless tobacco: Not on file  . Alcohol Use: No    Review of Systems Constitutional: No fever/chills Eyes: No  visual changes. ENT: No sore throat. Cardiovascular: positive for chest pain. Respiratory: Denies shortness of breath. Gastrointestinal: No abdominal pain.  No nausea, no vomiting.  No diarrhea.  No constipation. Genitourinary: Negative for dysuria. Musculoskeletal: Negative for back pain. Skin: Negative for rash. Neurological: Negative for headaches, focal weakness or numbness.  10-point ROS otherwise negative.  ____________________________________________   PHYSICAL EXAM:  VITAL SIGNS: ED Triage Vitals  Enc Vitals Group     BP 02/09/15 1000 145/91 mmHg     Pulse Rate 02/09/15 1000 83     Resp 02/09/15 1000 14     Temp 02/09/15 1002 97.6 F (36.4 C)     Temp Source 02/09/15 1002 Oral     SpO2 02/09/15 1000 98 %     Weight 02/09/15 1002 286 lb (129.729 kg)     Height 02/09/15 1002 5\' 7"  (1.702 m)     Head Cir --      Peak Flow --      Pain Score 02/09/15 1005 5     Pain Loc --      Pain Edu? --      Excl. in GC? --    Today's Vitals   02/09/15 1330 02/09/15  1345 02/09/15 1400 02/09/15 1415  BP: 135/88  136/90   Pulse: 72 70 79 79  Temp:      TempSrc:      Resp: 13 11 13 15   Height:      Weight:      SpO2: 95% 97% 97% 98%  PainSc:       Constitutional: Alert and oriented. Well appearing and in no acute distress. Eyes: Conjunctivae are normal. PERRL. EOMI. Head: Atraumatic.  Nose: No congestion/rhinnorhea.  Mouth/Throat: Mucous membranes are moist.  Oropharynx non-erythematous. Neck: No stridor.  No cervical spine tenderness to palpation. Hematological/Lymphatic/Immunilogical: No cervical lymphadenopathy. Cardiovascular: Normal rate, regular rhythm. Grossly normal heart sounds.  Good peripheral circulation. Respiratory: Normal respiratory effort.  No retractions. Lungs CTAB. Gastrointestinal: Soft and nontender. Obese abdomen. Normal Bowel sounds.  No abdominal bruits. No CVA tenderness. Musculoskeletal: No lower or upper extremity tenderness.  No joint effusions. Bilateral pedal pulses equal and easily palpated with Mild bilateral lower extremity edema, nonpitting.  Neurologic:  Normal speech and language. No gross focal neurologic deficits are appreciated. No gait instability. Skin:  Skin is warm, dry and intact. No rash noted. Psychiatric: Mood and affect are normal. Speech and behavior are normal.  ____________________________________________   LABS (all labs ordered are listed, but only abnormal results are displayed)  Labs Reviewed  COMPREHENSIVE METABOLIC PANEL - Abnormal; Notable for the following:    Glucose, Bld 178 (*)    Calcium 8.8 (*)    All other components within normal limits  CBC WITH DIFFERENTIAL/PLATELET - Abnormal; Notable for the following:    HCT 39.9 (*)    All other components within normal limits  TROPONIN I  PROTIME-INR  APTT  TROPONIN I   ____________________________________________  EKG  See Dr 04/11/15 dictation ____________________________________________  RADIOLOGY  EXAM: CHEST 2  VIEW  COMPARISON: 09/23/2012  FINDINGS: Cardiomediastinal silhouette is stable. Mild elevation of the right hemidiaphragm again noted. No acute infiltrate or pleural effusion. No pulmonary edema. Minimal degenerative changes mid thoracic spine.  IMPRESSION: No active cardiopulmonary disease.   Electronically Signed By: Leata Mouse M.D. On: 02/09/2015 10:46   INITIAL IMPRESSION / ASSESSMENT AND PLAN / ED COURSE  Pertinent labs & imaging results that were available during my care of the patient were  reviewed by me and considered in my medical decision making (see chart for details).  Well-appearing patient. Presents to the ER for the onset of chest pain at 8:30 this a.m. Patient with previous MI and reports current chest pain feels similar to previous. Patient reports chest pain has eased up post sublingual nitroglycerin given by EMS. Patient states that current pain is 2-3 out of 10 as intermittent stabbing. Will evaluate labs as well as chest x-ray and discuss with patients cardiologist Dr. Juliann Pares. Reports history of chest pain with stress and anxiety.   1100: Patient currently reports that he is pain-free. Patient denies chest pain at this time. Denies other complaints. Resting calmly in bed. Labs reviewed. Initial troponin negative. Discussed patient and plan of care with Dr. Juliann Pares cardiology. Dr. Juliann Pares recommends repeat troponin in 4 hours. Dr. Juliann Pares recommends as long as second troponin is negative and patient continues to remain pain fee,  patient to be discharged and he will follow-up with patient in office tomorrow. Dr. Juliann Pares recommends follow-up tomorrow for further evaluation and will likely do a stress test this week outpatient. awaiting repeat troponin.   1215: Patient resting calmly in bed. Denies current complaints. Patient denies chest pain at this time. Awaiting repeat troponin.  1330: sleeping in bed. Quickly awakes to verbal stimulation. No acute  distress. Denies chest pain or discomfort. Denies shortness of breath or other complaints.   1450: Repeat troponin negative. As previously discussed with patient as well as Dr. Juliann Pares cardiology, patient to follow up outpatient with him tomorrow. Awaiting appointment time.  1510: [place] nurse called back and Dr Juliann Pares will see patient in office at 1130am tomorrow.Patient reports feels well and denies complaints. Patient has remained chest pain free in ER and denies other complaints. Discussed with patient to follow up tomorrow as discussed. Patient reports and verbalized that he will follow-up. Discussed strict follow-up and return parameters. Patient verbalized understanding and agreed to plan. ____________________________________________   FINAL CLINICAL IMPRESSION(S) / ED DIAGNOSES  Final diagnoses:  Chest pain, unspecified chest pain type       Renford Dills, NP 02/09/15 1546  Renford Dills, NP 02/09/15 1547  Darien Ramus, MD 02/09/15 1623

## 2015-02-09 NOTE — Discharge Instructions (Signed)
Take home medications as prescribed. Rest. Avoid strenuous activity or stressful events.  As discussed, you are to follow-up tomorrow with Dr. Juliann Pares in his office. You have an appointment at 11:30 AM. Please keep this appointment. See above. Return to the ER immediately for chest pain, shortness of breath, new or worsening concerns.  Chest Pain (Nonspecific) It is often hard to give a specific diagnosis for the cause of chest pain. There is always a chance that your pain could be related to something serious, such as a heart attack or a blood clot in the lungs. You need to follow up with your health care provider for further evaluation. CAUSES   Heartburn.  Pneumonia or bronchitis.  Anxiety or stress.  Inflammation around your heart (pericarditis) or lung (pleuritis or pleurisy).  A blood clot in the lung.  A collapsed lung (pneumothorax). It can develop suddenly on its own (spontaneous pneumothorax) or from trauma to the chest.  Shingles infection (herpes zoster virus). The chest wall is composed of bones, muscles, and cartilage. Any of these can be the source of the pain.  The bones can be bruised by injury.  The muscles or cartilage can be strained by coughing or overwork.  The cartilage can be affected by inflammation and become sore (costochondritis). DIAGNOSIS  Lab tests or other studies may be needed to find the cause of your pain. Your health care provider may have you take a test called an ambulatory electrocardiogram (ECG). An ECG records your heartbeat patterns over a 24-hour period. You may also have other tests, such as:  Transthoracic echocardiogram (TTE). During echocardiography, sound waves are used to evaluate how blood flows through your heart.  Transesophageal echocardiogram (TEE).  Cardiac monitoring. This allows your health care provider to monitor your heart rate and rhythm in real time.  Holter monitor. This is a portable device that records your  heartbeat and can help diagnose heart arrhythmias. It allows your health care provider to track your heart activity for several days, if needed.  Stress tests by exercise or by giving medicine that makes the heart beat faster. TREATMENT   Treatment depends on what may be causing your chest pain. Treatment may include:  Acid blockers for heartburn.  Anti-inflammatory medicine.  Pain medicine for inflammatory conditions.  Antibiotics if an infection is present.  You may be advised to change lifestyle habits. This includes stopping smoking and avoiding alcohol, caffeine, and chocolate.  You may be advised to keep your head raised (elevated) when sleeping. This reduces the chance of acid going backward from your stomach into your esophagus. Most of the time, nonspecific chest pain will improve within 2-3 days with rest and mild pain medicine.  HOME CARE INSTRUCTIONS   If antibiotics were prescribed, take them as directed. Finish them even if you start to feel better.  For the next few days, avoid physical activities that bring on chest pain. Continue physical activities as directed.  Do not use any tobacco products, including cigarettes, chewing tobacco, or electronic cigarettes.  Avoid drinking alcohol.  Only take medicine as directed by your health care provider.  Follow your health care provider's suggestions for further testing if your chest pain does not go away.  Keep any follow-up appointments you made. If you do not go to an appointment, you could develop lasting (chronic) problems with pain. If there is any problem keeping an appointment, call to reschedule. SEEK MEDICAL CARE IF:   Your chest pain does not go away, even after  treatment.  You have a rash with blisters on your chest.  You have a fever. SEEK IMMEDIATE MEDICAL CARE IF:   You have increased chest pain or pain that spreads to your arm, neck, jaw, back, or abdomen.  You have shortness of breath.  You have  an increasing cough, or you cough up blood.  You have severe back or abdominal pain.  You feel nauseous or vomit.  You have severe weakness.  You faint.  You have chills. This is an emergency. Do not wait to see if the pain will go away. Get medical help at once. Call your local emergency services (911 in [place] Do not drive yourself to the hospital. MAKE SURE YOU:   Understand these instructions.  Will watch your condition.  Will get help right away if you are not doing well or get worse. Document Released: 04/04/2005 Document Revised: 06/30/2013 Document Reviewed: 01/29/2008 [facility] Patient Information 2015 [place], [place]. This information is not intended to replace advice given to you by your health care provider. Make sure you discuss any questions you have with your health care provider.

## 2015-02-09 NOTE — ED Notes (Signed)
Patient stable and stated that he can get a ride home. Gave discharge orders and patient stated understanding.

## 2015-02-09 NOTE — ED Notes (Addendum)
Patient was brought to the ED by [facility]. Patient started experiencing chest pain this morning at 0830 this pain is on his left side of the chest and in the left shoulder. Patient states "this is how my heart attack started feeling in 2014." Patient was given nitro SL x1 and ASA 324mg  by EMS.

## 2015-06-29 ENCOUNTER — Encounter (INDEPENDENT_AMBULATORY_CARE_PROVIDER_SITE_OTHER): Payer: Self-pay

## 2015-07-15 ENCOUNTER — Encounter: Payer: Self-pay | Admitting: Sports Medicine

## 2015-07-15 ENCOUNTER — Ambulatory Visit (INDEPENDENT_AMBULATORY_CARE_PROVIDER_SITE_OTHER): Payer: Medicaid Other | Admitting: Sports Medicine

## 2015-07-15 DIAGNOSIS — M79672 Pain in left foot: Secondary | ICD-10-CM

## 2015-07-15 DIAGNOSIS — B351 Tinea unguium: Secondary | ICD-10-CM

## 2015-07-15 DIAGNOSIS — E119 Type 2 diabetes mellitus without complications: Secondary | ICD-10-CM | POA: Diagnosis not present

## 2015-07-15 DIAGNOSIS — M79671 Pain in right foot: Secondary | ICD-10-CM | POA: Diagnosis not present

## 2015-07-15 DIAGNOSIS — L84 Corns and callosities: Secondary | ICD-10-CM | POA: Diagnosis not present

## 2015-07-15 NOTE — Patient Instructions (Signed)
Diabetes and Foot Care Diabetes may cause you to have problems because of poor blood supply (circulation) to your feet and legs. This may cause the skin on your feet to become thinner, break easier, and heal more slowly. Your skin may become dry, and the skin may peel and crack. You may also have nerve damage in your legs and feet causing decreased feeling in them. You may not notice minor injuries to your feet that could lead to infections or more serious problems. Taking care of your feet is one of the most important things you can do for yourself.  HOME CARE INSTRUCTIONS  Wear shoes at all times, even in the house. Do not go barefoot. Bare feet are easily injured.  Check your feet daily for blisters, cuts, and redness. If you cannot see the bottom of your feet, use a mirror or ask someone for help.  Wash your feet with warm water (do not use hot water) and mild soap. Then pat your feet and the areas between your toes until they are completely dry. Do not soak your feet as this can dry your skin.  Apply a moisturizing lotion or petroleum jelly (that does not contain alcohol and is unscented) to the skin on your feet and to dry, brittle toenails. Do not apply lotion between your toes.  Trim your toenails straight across. Do not dig under them or around the cuticle. File the edges of your nails with an emery board or nail file.  Do not cut corns or calluses or try to remove them with medicine.  Wear clean socks or stockings every day. Make sure they are not too tight. Do not wear knee-high stockings since they may decrease blood flow to your legs.  Wear shoes that fit properly and have enough cushioning. To break in new shoes, wear them for just a few hours a day. This prevents you from injuring your feet. Always look in your shoes before you put them on to be sure there are no objects inside.  Do not cross your legs. This may decrease the blood flow to your feet.  If you find a minor scrape,  cut, or break in the skin on your feet, keep it and the skin around it clean and dry. These areas may be cleansed with mild soap and water. Do not cleanse the area with peroxide, alcohol, or iodine.  When you remove an adhesive bandage, be sure not to damage the skin around it.  If you have a wound, look at it several times a day to make sure it is healing.  Do not use heating pads or hot water bottles. They may burn your skin. If you have lost feeling in your feet or legs, you may not know it is happening until it is too late.  Make sure your health care provider performs a complete foot exam at least annually or more often if you have foot problems. Report any cuts, sores, or bruises to your health care provider immediately. SEEK MEDICAL CARE IF:   You have an injury that is not healing.  You have cuts or breaks in the skin.  You have an ingrown nail.  You notice redness on your legs or feet.  You feel burning or tingling in your legs or feet.  You have pain or cramps in your legs and feet.  Your legs or feet are numb.  Your feet always feel cold. SEEK IMMEDIATE MEDICAL CARE IF:   There is increasing redness,   swelling, or pain in or around a wound.  There is a red line that goes up your leg.  Pus is coming from a wound.  You develop a fever or as directed by your health care provider.  You notice a bad smell coming from an ulcer or wound.   This information is not intended to replace advice given to you by your health care provider. Make sure you discuss any questions you have with your health care provider.   Document Released: 06/22/2000 Document Revised: 02/25/2013 Document Reviewed: 12/02/2012 Elsevier Interactive Patient Education 2016 [employer]  

## 2015-07-15 NOTE — Progress Notes (Signed)
Patient ID: Derrick Barajas, male   DOB: 08/26/1955, 60 y.o.   MRN: [id number] Subjective: Derrick Barajas is a 60 y.o. male patient with history of type 2 diabetes who presents to office today complaining of long, painful nails  while ambulating in shoes; unable to trim. Patient states that the glucose reading this morning was 90 mg/dl. Diagnosed with DM >5 years ago.Patient denies any new changes in medication or new problems. Patient denies any new cramping, numbness, burning or tingling in the legs.  There are no active problems to display for this patient.  Current Outpatient Prescriptions on File Prior to Visit  Medication Sig Dispense Refill  . amLODipine (NORVASC) 10 MG tablet Take 10 mg by mouth daily.    . furosemide (LASIX) 20 MG tablet Take 20 mg by mouth 2 (two) times daily.    Marland Kitchen HYDROcodone-acetaminophen (NORCO/VICODIN) 5-325 MG per tablet Take 1-2 tablets by mouth every 6 (six) hours as needed for pain. 15 tablet 0  . insulin glargine (LANTUS) 100 UNIT/ML injection Inject 110-200 Units into the skin at bedtime.    . insulin lispro (HUMALOG) 100 UNIT/ML injection Inject 12 Units into the skin 3 (three) times daily before meals.    . metoprolol succinate (TOPROL-XL) 50 MG 24 hr tablet Take 50 mg by mouth 2 (two) times daily. Take with or immediately following a meal.    . prasugrel (EFFIENT) 10 MG TABS Take by mouth.    . quinapril (ACCUPRIL) 20 MG tablet Take 20 mg by mouth at bedtime.    . rosuvastatin (CRESTOR) 40 MG tablet Take 40 mg by mouth daily.     No current facility-administered medications on file prior to visit.   Allergies  Allergen Reactions  . Iodine Nausea And Vomiting   Labs: HEMOGLOBIN A1C- No recent lab  Objective: General: Patient is awake, alert, and oriented x 3 and in no acute distress.  Integument: Skin is warm, dry and supple bilateral. Nails are tender, long, thickened and  dystrophic with subungual debris, consistent with onychomycosis, 1-5  bilateral. No signs of infection. No open lesions or preulcerative lesions present bilateral. Remaining integument unremarkable.  Vasculature:  Dorsalis Pedis pulse 2/4 bilateral. Posterior Tibial pulse  2/4 bilateral.  Capillary fill time <3 sec 1-5 bilateral. Positive hair growth to the level of the digits. Temperature gradient within normal limits. No varicosities present bilateral. No edema present bilateral.   Neurology: The patient has intact sensation measured with a 5.07/10g Semmes Weinstein Monofilament at all pedal sites bilateral . Vibratory sensation intact bilateral with tuning fork. No Babinski sign present bilateral.   Musculoskeletal: No gross pedal deformities noted bilateral. Muscular strength 5/5 in all lower extremity muscular groups bilateral without pain or limitation on range of motion . No tenderness with calf compression bilateral.  Assessment and Plan: Problem List Items Addressed This Visit    None    Visit Diagnoses    Dermatophytosis, nail    -  Primary    Foot pain, bilateral        Diabetes mellitus without complication (HCC)          -Examined patient. -Discussed and educated patient on diabetic foot care, especially with  regards to the vascular, neurological and musculoskeletal systems.  -Stressed the importance of good glycemic control and the detriment of not  controlling glucose levels in relation to the foot. -Mechanically debrided all nails 1-5 bilateral using sterile nail nipper and filed with dremel without incident  -Answered all patient  questions -Patient to return in 3 months for at risk foot care -Patient advised to call the office if any problems or questions arise in the meantime.  [name], DPM

## 2015-08-06 ENCOUNTER — Encounter: Payer: Self-pay | Admitting: Emergency Medicine

## 2015-08-06 ENCOUNTER — Emergency Department
Admission: EM | Admit: 2015-08-06 | Discharge: 2015-08-06 | Disposition: A | Discharge status: Home / Self Care | Payer: Medicaid Other | Attending: Emergency Medicine | Admitting: Emergency Medicine

## 2015-08-06 DIAGNOSIS — I10 Essential (primary) hypertension: Secondary | ICD-10-CM | POA: Insufficient documentation

## 2015-08-06 DIAGNOSIS — Z794 Long term (current) use of insulin: Secondary | ICD-10-CM | POA: Insufficient documentation

## 2015-08-06 DIAGNOSIS — Z79899 Other long term (current) drug therapy: Secondary | ICD-10-CM | POA: Diagnosis not present

## 2015-08-06 DIAGNOSIS — R739 Hyperglycemia, unspecified: Secondary | ICD-10-CM

## 2015-08-06 DIAGNOSIS — R197 Diarrhea, unspecified: Secondary | ICD-10-CM | POA: Insufficient documentation

## 2015-08-06 DIAGNOSIS — E1165 Type 2 diabetes mellitus with hyperglycemia: Secondary | ICD-10-CM | POA: Diagnosis not present

## 2015-08-06 LAB — GLUCOSE, CAPILLARY
Glucose-Capillary: 347 mg/dL — ABNORMAL HIGH (ref 65–99)
Glucose-Capillary: 242 mg/dL — ABNORMAL HIGH (ref 65–99)

## 2015-08-06 LAB — COMPREHENSIVE METABOLIC PANEL
ALT: 20 U/L (ref 17–63)
AST: 23 U/L (ref 15–41)
Albumin: 3.8 g/dL (ref 3.5–5.0)
Alkaline Phosphatase: 117 U/L (ref 38–126)
Anion gap: 8 (ref 5–15)
BILIRUBIN TOTAL: 0.7 mg/dL (ref 0.3–1.2)
BUN: 12 mg/dL (ref 6–20)
CALCIUM: 9 mg/dL (ref 8.9–10.3)
CO2: 25 mmol/L (ref 22–32)
CREATININE: 1.15 mg/dL (ref 0.61–1.24)
Chloride: 103 mmol/L (ref 101–111)
Glucose, Bld: 361 mg/dL — ABNORMAL HIGH (ref 65–99)
Potassium: 4.3 mmol/L (ref 3.5–5.1)
Sodium: 136 mmol/L (ref 135–145)
TOTAL PROTEIN: 7.2 g/dL (ref 6.5–8.1)

## 2015-08-06 LAB — CBC
HEMATOCRIT: 39.3 % — AB (ref 40.0–52.0)
Hemoglobin: 13.6 g/dL (ref 13.0–18.0)
MCH: 29.2 pg (ref 26.0–34.0)
MCHC: 34.5 g/dL (ref 32.0–36.0)
MCV: 84.8 fL (ref 80.0–100.0)
Platelets: 249 10*3/uL (ref 150–440)
RBC: 4.64 MIL/uL (ref 4.40–5.90)
RDW: 13.7 % (ref 11.5–14.5)
WBC: 5.9 10*3/uL (ref 3.8–10.6)

## 2015-08-06 MED ORDER — INSULIN ASPART 100 UNIT/ML ~~LOC~~ SOLN
4.0000 [IU] | Freq: Once | SUBCUTANEOUS | Status: AC
  Administered 2015-08-06: 4 [IU] via INTRAVENOUS
  Filled 2015-08-06: qty 4

## 2015-08-06 MED ORDER — SODIUM CHLORIDE 0.9 % IV BOLUS (SEPSIS)
1000.0000 mL | Freq: Once | INTRAVENOUS | Status: AC
  Administered 2015-08-06: 1000 mL via INTRAVENOUS

## 2015-08-06 NOTE — ED Notes (Signed)
Pt states he recently got changed from 2 doses of lantus daily to once a week insulin shot and states his blood sugar has never been below 200 since the change, states his normal blood sugars are in the 100s or below

## 2015-08-06 NOTE — ED Notes (Signed)
Pt changed from insulin (unknown name), januvia and metformin to taking a weekly shot (unknown name), januvia and metformin. Pt worried about his bs running in the 300's since the change 3 weeks ago. Has not called his pcp to see if he should increase the weekly shot or use short acting for coverage.

## 2015-08-06 NOTE — ED Provider Notes (Signed)
[facility] Emergency Department Provider Note  Time seen: 11:16 AM  I have reviewed the triage vital signs and the nursing notes.   HISTORY  Chief Complaint Hyperglycemia    HPI Derrick Barajas is a 60 y.o. male with a past medical history of hypertension, diabetes, CVA, MI who presents the emergency department though bit of blood sugars. According to the patient for the past 3 weeks since beginning his once a week diabetic injection medication he has been having elevated blood glucose. He states he is eating well, drinking plenty of fluids but continues to have elevated blood sugars. He states they range anywhere from 160-400. Denies any abdominal pain, nausea, vomiting, fever. Patient does state he has been having occasional diarrhea and has been urinating more frequently but denies any dysuria.    Past Medical History  Diagnosis Date  . MI (myocardial infarction) (HCC)   . Hypertension   . Diabetes mellitus without complication (HCC)   . Gout   . RA (rheumatoid arthritis) (HCC)   . Stroke [facility])     left facal droop    There are no active problems to display for this patient.   History reviewed. No pertinent past surgical history.  Current Outpatient Rx  Name  Route  Sig  Dispense  Refill  . amLODipine (NORVASC) 10 MG tablet   Oral   Take 10 mg by mouth daily.         . furosemide (LASIX) 20 MG tablet   Oral   Take 20 mg by mouth 2 (two) times daily.         Marland Kitchen HYDROcodone-acetaminophen (NORCO/VICODIN) 5-325 MG per tablet   Oral   Take 1-2 tablets by mouth every 6 (six) hours as needed for pain.   15 tablet   0   . insulin glargine (LANTUS) 100 UNIT/ML injection   Subcutaneous   Inject 110-200 Units into the skin at bedtime.         . insulin lispro (HUMALOG) 100 UNIT/ML injection   Subcutaneous   Inject 12 Units into the skin 3 (three) times daily before meals.         . metoprolol succinate (TOPROL-XL) 50 MG 24 hr tablet    Oral   Take 50 mg by mouth 2 (two) times daily. Take with or immediately following a meal.         . prasugrel (EFFIENT) 10 MG TABS   Oral   Take by mouth.         . quinapril (ACCUPRIL) 20 MG tablet   Oral   Take 20 mg by mouth at bedtime.         . rosuvastatin (CRESTOR) 40 MG tablet   Oral   Take 40 mg by mouth daily.           Allergies Iodine  History reviewed. No pertinent family history.  Social History Social History  Substance Use Topics  . Smoking status: Never Smoker   . Smokeless tobacco: None  . Alcohol Use: No    Review of Systems Constitutional: Negative for fever Cardiovascular: Negative for chest pain. Respiratory: Negative for shortness of breath. Gastrointestinal: Negative for abdominal pain,. Negative for nausea or vomiting. Positive for diarrhea. Genitourinary: Negative for dysuria. Positive for urinary frequency  Musculoskeletal: Negative for back pain. Neurological: Negative for headache 10-point ROS otherwise negative.  ____________________________________________   PHYSICAL EXAM:  VITAL SIGNS: ED Triage Vitals  Enc Vitals Group     BP 08/06/15  1040 152/87 mmHg     Pulse Rate 08/06/15 1040 92     Resp 08/06/15 1040 20     Temp 08/06/15 1040 98 F (36.7 C)     Temp Source 08/06/15 1040 Oral     SpO2 08/06/15 1040 97 %     Weight 08/06/15 1040 285 lb (129.275 kg)     Height 08/06/15 1040 5\' 7"  (1.702 m)     Head Cir --      Peak Flow --      Pain Score 08/06/15 1040 0     Pain Loc --      Pain Edu? --      Excl. in GC? --     Constitutional: Alert and oriented. Well appearing and in no distress. Eyes: Normal exam ENT   Head: Normocephalic and atraumatic.   Mouth/Throat: Mucous membranes are moist. Cardiovascular: Normal rate, regular rhythm. No murmur Respiratory: Normal respiratory effort without tachypnea nor retractions. Breath sounds are clear and equal bilaterally. No  wheezes/rales/rhonchi. Gastrointestinal: Soft and nontender. No distention.   Musculoskeletal: Nontender with normal range of motion in all extremities.  Neurologic:  Normal speech and language. No gross focal neurologic deficits  Skin:  Skin is warm, dry and intact.  Psychiatric: Mood and affect are normal. Speech and behavior are normal.   ____________________________________________    INITIAL IMPRESSION / ASSESSMENT AND PLAN / ED COURSE  Pertinent labs & imaging results that were available during my care of the patient were reviewed by me and considered in my medical decision making (see chart for details).  Patient presents to the emergency department with an elevated blood glucose. Currently cratered and 300.08/08/15 We'll check labs, urinalysis, IV hydrate, and closely monitor in the emergency department.  Labs are largely within normal limits besides an elevated glucose. Anion gap is normal. Patient's fingerstick now down to 240. We'll discharge the patient home he'll follow-up with his primary care doctor this week to discuss his current medication regimen and possible adjustments. Patient agreeable to plan. I discussed with the patient truly monitor his diet closely, avoid sugary foods, as well as carbohydrates and drink plenty of water.  ____________________________________________   FINAL CLINICAL IMPRESSION(S) / ED DIAGNOSES  Hyperglycemia   Marland Kitchen, MD 08/06/15 1316

## 2015-08-06 NOTE — ED Notes (Signed)
Reports taking a new insulin shot omcea week x 3 wks.  States he is worried that its not working so he came to get it checked.  Denies amy new sx.

## 2015-08-06 NOTE — Discharge Instructions (Signed)
Hyperglycemia °Hyperglycemia occurs when the glucose (sugar) in your blood is too high. Hyperglycemia can happen for many reasons, but it most often happens to people who do not know they have diabetes or are not managing their diabetes properly.  °CAUSES  °Whether you have diabetes or not, there are other causes of hyperglycemia. Hyperglycemia can occur when you have diabetes, but it can also occur in other situations that you might not be as aware of, such as: °Diabetes °· If you have diabetes and are having problems controlling your blood glucose, hyperglycemia could occur because of some of the following reasons: °¨ Not following your meal plan. °¨ Not taking your diabetes medications or not taking it properly. °¨ Exercising less or doing less activity than you normally do. °¨ Being sick. °Pre-diabetes °· This cannot be ignored. Before people develop Type 2 diabetes, they almost always have "pre-diabetes." This is when your blood glucose levels are higher than normal, but not yet high enough to be diagnosed as diabetes. Research has shown that some long-term damage to the body, especially the heart and circulatory system, may already be occurring during pre-diabetes. If you take action to manage your blood glucose when you have pre-diabetes, you may delay or prevent Type 2 diabetes from developing. °Stress °· If you have diabetes, you may be "diet" controlled or on oral medications or insulin to control your diabetes. However, you may find that your blood glucose is higher than usual in the hospital whether you have diabetes or not. This is often referred to as "stress hyperglycemia." Stress can elevate your blood glucose. This happens because of hormones put out by the body during times of stress. If stress has been the cause of your high blood glucose, it can be followed regularly by your caregiver. That way he/she can make sure your hyperglycemia does not continue to get worse or progress to  diabetes. °Steroids °· Steroids are medications that act on the infection fighting system (immune system) to block inflammation or infection. One side effect can be a rise in blood glucose. Most people can produce enough extra insulin to allow for this rise, but for those who cannot, steroids make blood glucose levels go even higher. It is not unusual for steroid treatments to "uncover" diabetes that is developing. It is not always possible to determine if the hyperglycemia will go away after the steroids are stopped. A special blood test called an A1c is sometimes done to determine if your blood glucose was elevated before the steroids were started. °SYMPTOMS °· Thirsty. °· Frequent urination. °· Dry mouth. °· Blurred vision. °· Tired or fatigue. °· Weakness. °· Sleepy. °· Tingling in feet or leg. °DIAGNOSIS  °Diagnosis is made by monitoring blood glucose in one or all of the following ways: °· A1c test. This is a chemical found in your blood. °· Fingerstick blood glucose monitoring. °· Laboratory results. °TREATMENT  °First, knowing the cause of the hyperglycemia is important before the hyperglycemia can be treated. Treatment may include, but is not be limited to: °· Education. °· Change or adjustment in medications. °· Change or adjustment in meal plan. °· Treatment for an illness, infection, etc. °· More frequent blood glucose monitoring. °· Change in exercise plan. °· Decreasing or stopping steroids. °· Lifestyle changes. °HOME CARE INSTRUCTIONS  °· Test your blood glucose as directed. °· Exercise regularly. Your caregiver will give you instructions about exercise. Pre-diabetes or diabetes which comes on with stress is helped by exercising. °· Eat wholesome,   balanced meals. Eat often and at regular, fixed times. Your caregiver or nutritionist will give you a meal plan to guide your sugar intake. °· Being at an ideal weight is important. If needed, losing as little as 10 to 15 pounds may help improve blood  glucose levels. °SEEK MEDICAL CARE IF:  °· You have questions about medicine, activity, or diet. °· You continue to have symptoms (problems such as increased thirst, urination, or weight gain). °SEEK IMMEDIATE MEDICAL CARE IF:  °· You are vomiting or have diarrhea. °· Your breath smells fruity. °· You are breathing faster or slower. °· You are very sleepy or incoherent. °· You have numbness, tingling, or pain in your feet or hands. °· You have chest pain. °· Your symptoms get worse even though you have been following your caregiver's orders. °· If you have any other questions or concerns. °  °This information is not intended to replace advice given to you by your health care provider. Make sure you discuss any questions you have with your health care provider. °  °Document Released: 12/19/2000 Document Revised: 09/17/2011 Document Reviewed: 03/01/2015 °Elsevier Interactive Patient Education ©2016 [employer] ° °

## 2015-08-15 ENCOUNTER — Emergency Department
Admission: EM | Admit: 2015-08-15 | Discharge: 2015-08-15 | Disposition: A | Discharge status: Home / Self Care | Payer: Medicaid Other | Attending: Emergency Medicine | Admitting: Emergency Medicine

## 2015-08-15 ENCOUNTER — Encounter: Payer: Self-pay | Admitting: *Deleted

## 2015-08-15 DIAGNOSIS — E1165 Type 2 diabetes mellitus with hyperglycemia: Secondary | ICD-10-CM | POA: Insufficient documentation

## 2015-08-15 DIAGNOSIS — N481 Balanitis: Secondary | ICD-10-CM

## 2015-08-15 DIAGNOSIS — I10 Essential (primary) hypertension: Secondary | ICD-10-CM | POA: Diagnosis not present

## 2015-08-15 DIAGNOSIS — Z79899 Other long term (current) drug therapy: Secondary | ICD-10-CM | POA: Insufficient documentation

## 2015-08-15 DIAGNOSIS — Z794 Long term (current) use of insulin: Secondary | ICD-10-CM | POA: Insufficient documentation

## 2015-08-15 DIAGNOSIS — R197 Diarrhea, unspecified: Secondary | ICD-10-CM | POA: Insufficient documentation

## 2015-08-15 DIAGNOSIS — R739 Hyperglycemia, unspecified: Secondary | ICD-10-CM

## 2015-08-15 DIAGNOSIS — Z7902 Long term (current) use of antithrombotics/antiplatelets: Secondary | ICD-10-CM | POA: Diagnosis not present

## 2015-08-15 LAB — CBC
HCT: 40.4 % (ref 40.0–52.0)
Hemoglobin: 13.5 g/dL (ref 13.0–18.0)
MCH: 28.9 pg (ref 26.0–34.0)
MCHC: 33.4 g/dL (ref 32.0–36.0)
MCV: 86.6 fL (ref 80.0–100.0)
PLATELETS: 223 10*3/uL (ref 150–440)
RBC: 4.66 MIL/uL (ref 4.40–5.90)
RDW: 13.6 % (ref 11.5–14.5)
WBC: 7.2 10*3/uL (ref 3.8–10.6)

## 2015-08-15 LAB — BASIC METABOLIC PANEL
Anion gap: 12 (ref 5–15)
BUN: 22 mg/dL — ABNORMAL HIGH (ref 6–20)
CO2: 22 mmol/L (ref 22–32)
CREATININE: 1.26 mg/dL — AB (ref 0.61–1.24)
Calcium: 9.2 mg/dL (ref 8.9–10.3)
Chloride: 100 mmol/L — ABNORMAL LOW (ref 101–111)
Glucose, Bld: 415 mg/dL — ABNORMAL HIGH (ref 65–99)
Potassium: 4.2 mmol/L (ref 3.5–5.1)
SODIUM: 134 mmol/L — AB (ref 135–145)

## 2015-08-15 LAB — GLUCOSE, CAPILLARY
Glucose-Capillary: 347 mg/dL — ABNORMAL HIGH (ref 65–99)
Glucose-Capillary: 365 mg/dL — ABNORMAL HIGH (ref 65–99)
Glucose-Capillary: 406 mg/dL — ABNORMAL HIGH (ref 65–99)

## 2015-08-15 LAB — URINALYSIS COMPLETE WITH MICROSCOPIC (ARMC ONLY)
BILIRUBIN URINE: NEGATIVE
Bacteria, UA: NONE SEEN
Glucose, UA: >500 mg/dL — AB
HGB URINE DIPSTICK: NEGATIVE
KETONES UR: NEGATIVE mg/dL
LEUKOCYTES UA: NEGATIVE
NITRITE: NEGATIVE
PH: 5 (ref 5.0–8.0)
Protein, ur: NEGATIVE mg/dL
SPECIFIC GRAVITY, URINE: 1.028 (ref 1.005–1.030)
SQUAMOUS EPITHELIAL / LPF: NONE SEEN

## 2015-08-15 MED ORDER — SODIUM CHLORIDE 0.9 % IV BOLUS (SEPSIS)
1000.0000 mL | Freq: Once | INTRAVENOUS | Status: AC
  Administered 2015-08-15: 1000 mL via INTRAVENOUS

## 2015-08-15 MED ORDER — BACITRACIN ZINC 500 UNIT/GM EX OINT
TOPICAL_OINTMENT | Freq: Two times a day (BID) | CUTANEOUS | Status: DC
  Administered 2015-08-15: 1 via TOPICAL
  Filled 2015-08-15: qty 0.9

## 2015-08-15 MED ORDER — INSULIN ASPART 100 UNIT/ML ~~LOC~~ SOLN
8.0000 [IU] | Freq: Once | SUBCUTANEOUS | Status: AC
  Administered 2015-08-15: 8 [IU] via SUBCUTANEOUS
  Filled 2015-08-15: qty 8

## 2015-08-15 NOTE — ED Notes (Signed)
Cleaned pt. Genital area and applied bacitracin

## 2015-08-15 NOTE — Discharge Instructions (Signed)
1. Continue all medicines as directed by your doctor. 2. Keep area around the head of the penis clean and dry. Apply thin layer of antibiotic ointment twice daily 5 days. 3. Return to the ER for worsening symptoms, persistent vomiting, fever, difficulty breathing or other concerns.  Balanitis Balanitis is inflammation of the head of the penis (glans).  CAUSES  Balanitis has multiple causes, both infectious and noninfectious. Often balanitis is the result of poor personal hygiene, especially in uncircumcised males. Without adequate washing, viruses, bacteria, and yeast collect between the foreskin and the glans. This can cause an infection. Lack of air and irritation from a normal secretion called smegma contribute to the cause in uncircumcised males. Other causes include:  Chemical irritation from the use of certain soaps and shower gels (especially soaps with perfumes), condoms, personal lubricants, petroleum jelly, spermicides, and fabric conditioners.  Skin conditions, such as eczema, dermatitis, and psoriasis.  Allergies to drugs, such as tetracycline and sulfa.  Certain medical conditions, including liver cirrhosis, congestive heart failure, and kidney disease.  Morbid obesity. RISK FACTORS  Diabetes mellitus.  A tight foreskin that is difficult to pull back past the glans (phimosis).  Sex without the use of a condom. SIGNS AND SYMPTOMS  Symptoms may include:  Discharge coming from under the foreskin.  Tenderness.  Itching and inability to get an erection (because of the pain).  Redness and a rash.  Sores on the glans and on the foreskin. DIAGNOSIS Diagnosis of balanitis is confirmed through a physical exam. TREATMENT The treatment is based on the cause of the balanitis. Treatment may include:  Frequent cleansing.  Keeping the glans and foreskin dry.  Use of medicines such as creams, pain medicines, antibiotics, or medicines to treat fungal infections.  Sitz  baths. If the irritation has caused a scar on the foreskin that prevents easy retraction, a circumcision may be recommended.  HOME CARE INSTRUCTIONS  Sex should be avoided until the condition has cleared. MAKE SURE YOU:  Understand these instructions.  Will watch your condition.  Will get help right away if you are not doing well or get worse.   This information is not intended to replace advice given to you by your health care provider. Make sure you discuss any questions you have with your health care provider.   Document Released: 11/11/2008 Document Revised: 06/30/2013 Document Reviewed: 12/15/2012 Elsevier Interactive Patient Education 2016 Else[employer] Inc[employer]  Hyperglycemia Hyperglycemia occurs when the glucose (sugar) in your blood is too high. Hyperglycemia can happen for many reasons, but it most often happens to people who do not know they have diabetes or are not managing their diabetes properly.  CAUSES  Whether you have diabetes or not, there are other causes of hyperglycemia. Hyperglycemia can occur when you have diabetes, but it can also occur in other situations that you might not be as aware of, such as: Diabetes  If you have diabetes and are having problems controlling your blood glucose, hyperglycemia could occur because of some of the following reasons:  Not following your meal plan.  Not taking your diabetes medications or not taking it properly.  Exercising less or doing less activity than you normally do.  Being sick. Pre-diabetes  This cannot be ignored. Before people develop Type 2 diabetes, they almost always have "pre-diabetes." This is when your blood glucose levels are higher than normal, but not yet high enough to be diagnosed as diabetes. Research has shown that some long-term damage to the body, especially the  heart and circulatory system, may already be occurring during pre-diabetes. If you take action to manage your blood glucose when you have  pre-diabetes, you may delay or prevent Type 2 diabetes from developing. Stress  If you have diabetes, you may be "diet" controlled or on oral medications or insulin to control your diabetes. However, you may find that your blood glucose is higher than usual in the hospital whether you have diabetes or not. This is often referred to as "stress hyperglycemia." Stress can elevate your blood glucose. This happens because of hormones put out by the body during times of stress. If stress has been the cause of your high blood glucose, it can be followed regularly by your caregiver. That way he/she can make sure your hyperglycemia does not continue to get worse or progress to diabetes. Steroids  Steroids are medications that act on the infection fighting system (immune system) to block inflammation or infection. One side effect can be a rise in blood glucose. Most people can produce enough extra insulin to allow for this rise, but for those who cannot, steroids make blood glucose levels go even higher. It is not unusual for steroid treatments to "uncover" diabetes that is developing. It is not always possible to determine if the hyperglycemia will go away after the steroids are stopped. A special blood test called an A1c is sometimes done to determine if your blood glucose was elevated before the steroids were started. SYMPTOMS  Thirsty.  Frequent urination.  Dry mouth.  Blurred vision.  Tired or fatigue.  Weakness.  Sleepy.  Tingling in feet or leg. DIAGNOSIS  Diagnosis is made by monitoring blood glucose in one or all of the following ways:  A1c test. This is a chemical found in your blood.  Fingerstick blood glucose monitoring.  Laboratory results. TREATMENT  First, knowing the cause of the hyperglycemia is important before the hyperglycemia can be treated. Treatment may include, but is not be limited to:  Education.  Change or adjustment in medications.  Change or adjustment in  meal plan.  Treatment for an illness, infection, etc.  More frequent blood glucose monitoring.  Change in exercise plan.  Decreasing or stopping steroids.  Lifestyle changes. HOME CARE INSTRUCTIONS   Test your blood glucose as directed.  Exercise regularly. Your caregiver will give you instructions about exercise. Pre-diabetes or diabetes which comes on with stress is helped by exercising.  Eat wholesome, balanced meals. Eat often and at regular, fixed times. Your caregiver or nutritionist will give you a meal plan to guide your sugar intake.  Being at an ideal weight is important. If needed, losing as little as 10 to 15 pounds may help improve blood glucose levels. SEEK MEDICAL CARE IF:   You have questions about medicine, activity, or diet.  You continue to have symptoms (problems such as increased thirst, urination, or weight gain). SEEK IMMEDIATE MEDICAL CARE IF:   You are vomiting or have diarrhea.  Your breath smells fruity.  You are breathing faster or slower.  You are very sleepy or incoherent.  You have numbness, tingling, or pain in your feet or hands.  You have chest pain.  Your symptoms get worse even though you have been following your caregiver's orders.  If you have any other questions or concerns.   This information is not intended to replace advice given to you by your health care provider. Make sure you discuss any questions you have with your health care provider.   Document Released:  12/19/2000 Document Revised: 09/17/2011 Document Reviewed: 03/01/2015 Elsevier Interactive Patient Education [employer].

## 2015-08-15 NOTE — ED Notes (Signed)
Pt. States diarrhea 3 times in last 24 hours.

## 2015-08-15 NOTE — ED Notes (Addendum)
Pt presents w/ c/o hyperglycemia over 400 at home. Pt states diarrhea all day, today. Pt denies n/v, and fever. Pt also c/o rash in groin x 1 week.

## 2015-08-15 NOTE — ED Provider Notes (Signed)
[facility] Emergency Department Provider Note  ____________________________________________  Time seen: Approximately 3:25 AM  I have reviewed the triage vital signs and the nursing notes.   HISTORY  Chief Complaint Hyperglycemia    HPI Derrick Barajas is a 60 y.o. male who presents to the ED from home with a chief complaint of hyperglycemia. Patient has a past medical history of hypertension, diabetes, CVA, MI who has frequently elevated blood sugars. Patient states he was placed on a new insulin pen (Tanzeum) last week and states his sugars have been high (in the 200s) since starting the new medicine.Notes diarrhea all day yesterday, not associated with fever, nausea, vomiting or abdominal pain. Also notes rash to his penis times one week. Denies chest pain, shortness of breath, dysuria. Denies recent travel or trauma. Nothing makes his symptoms better or worse.   Past Medical History  Diagnosis Date  . MI (myocardial infarction) (HCC)   . Hypertension   . Diabetes mellitus without complication (HCC)   . Gout   . RA (rheumatoid arthritis) (HCC)   . Stroke [facility])     left facal droop    There are no active problems to display for this patient.   Past Surgical History  Procedure Laterality Date  . Coronary angioplasty with stent placement    . Circumcision      Current Outpatient Rx  Name  Route  Sig  Dispense  Refill  . amLODipine (NORVASC) 10 MG tablet   Oral   Take 10 mg by mouth daily.         . furosemide (LASIX) 20 MG tablet   Oral   Take 20 mg by mouth 2 (two) times daily.         Marland Kitchen HYDROcodone-acetaminophen (NORCO/VICODIN) 5-325 MG per tablet   Oral   Take 1-2 tablets by mouth every 6 (six) hours as needed for pain.   15 tablet   0   . insulin glargine (LANTUS) 100 UNIT/ML injection   Subcutaneous   Inject 110-200 Units into the skin at bedtime.         . insulin lispro (HUMALOG) 100 UNIT/ML injection   Subcutaneous    Inject 12 Units into the skin 3 (three) times daily before meals.         . metoprolol succinate (TOPROL-XL) 50 MG 24 hr tablet   Oral   Take 50 mg by mouth 2 (two) times daily. Take with or immediately following a meal.         . prasugrel (EFFIENT) 10 MG TABS   Oral   Take by mouth.         . quinapril (ACCUPRIL) 20 MG tablet   Oral   Take 20 mg by mouth at bedtime.         . rosuvastatin (CRESTOR) 40 MG tablet   Oral   Take 40 mg by mouth daily.           Allergies Iodine  History reviewed. No pertinent family history.  Social History Social History  Substance Use Topics  . Smoking status: Never Smoker   . Smokeless tobacco: Never Used  . Alcohol Use: No    Review of Systems Constitutional: No fever/chills Eyes: No visual changes. ENT: No sore throat. Cardiovascular: Denies chest pain. Respiratory: Denies shortness of breath. Gastrointestinal: No abdominal pain.  No nausea, no vomiting.  Positive for diarrhea.  No constipation. Genitourinary: Positive for rash in groin. Negative for dysuria. Musculoskeletal: Negative for back  pain. Skin: Negative for rash. Neurological: Negative for headaches, focal weakness or numbness.  10-point ROS otherwise negative.  ____________________________________________   PHYSICAL EXAM:  VITAL SIGNS: ED Triage Vitals  Enc Vitals Group     BP 08/15/15 0044 131/71 mmHg     Pulse Rate 08/15/15 0044 85     Resp 08/15/15 0044 24     Temp 08/15/15 0044 98.1 F (36.7 C)     Temp Source 08/15/15 0044 Oral     SpO2 08/15/15 0044 98 %     Weight 08/15/15 0044 285 lb (129.275 kg)     Height 08/15/15 0044 5\' 7"  (1.702 m)     Head Cir --      Peak Flow --      Pain Score 08/15/15 0045 5     Pain Loc --      Pain Edu? --      Excl. in GC? --     Constitutional: Alert and oriented. Well appearing and in no acute distress. Eyes: Conjunctivae are normal. PERRL. EOMI. Head: Atraumatic. Nose: No  congestion/rhinnorhea. Mouth/Throat: Mucous membranes are moist.  Oropharynx non-erythematous. Neck: No stridor.   Cardiovascular: Normal rate, regular rhythm. Grossly normal heart sounds.  Good peripheral circulation. Respiratory: Normal respiratory effort.  No retractions. Lungs CTAB. Gastrointestinal: Soft and nontender. No distention. No abdominal bruits. No CVA tenderness. Genitourinary: Circumcised male. Yeast-like debris surrounding head of penis. No penile discharge. No rash, vesicles or lesions. No rash in groin. No lymphadenopathy. Bilaterally distended testicles which are nontender and nonswollen. No inguinal palpable masses. Strong cremaster reflexes bilaterally. Musculoskeletal: No lower extremity tenderness nor edema.  No joint effusions. Neurologic:  Normal speech and language. No gross focal neurologic deficits are appreciated. No gait instability. Skin:  Skin is warm, dry and intact. No rash noted. Psychiatric: Mood and affect are normal. Speech and behavior are normal.  ____________________________________________   LABS (all labs ordered are listed, but only abnormal results are displayed)  Labs Reviewed  GLUCOSE, CAPILLARY - Abnormal; Notable for the following:    Glucose-Capillary 406 (*)    All other components within normal limits  BASIC METABOLIC PANEL - Abnormal; Notable for the following:    Sodium 134 (*)    Chloride 100 (*)    Glucose, Bld 415 (*)    BUN 22 (*)    Creatinine, Ser 1.26 (*)    All other components within normal limits  URINALYSIS COMPLETEWITH MICROSCOPIC ([facility] ONLY) - Abnormal; Notable for the following:    Color, Urine STRAW (*)    APPearance CLEAR (*)    Glucose, UA >500 (*)    All other components within normal limits  GLUCOSE, CAPILLARY - Abnormal; Notable for the following:    Glucose-Capillary 365 (*)    All other components within normal limits  CBC  CBG MONITORING, ED  CBG MONITORING, ED    ____________________________________________  EKG  None ____________________________________________  RADIOLOGY  None ____________________________________________   PROCEDURES  Procedure(s) performed: None  Critical Care performed: No  ____________________________________________   INITIAL IMPRESSION / ASSESSMENT AND PLAN / ED COURSE  Pertinent labs & imaging results that were available during my care of the patient were reviewed by me and considered in my medical decision making (see chart for details).  60 year old male with a history of diabetes with frequent visits for hyperglycemia who presents with blood sugar over 400, diarrhea and balanitis. Laboratory and urinalysis results notable for elevated glucose of 415 without elevated anion gap. Will initiate IV  fluid resuscitation, cleanse area around head of penis from debris, apply antibiotic ointment and reassess.  ----------------------------------------- 5:49 AM on 08/15/2015 -----------------------------------------  Blood sugar improving. Patient resting without complaints. Discussed with patient and given strict return precautions. Patient verbalizes understanding and agrees with plan of care. ____________________________________________   FINAL CLINICAL IMPRESSION(S) / ED DIAGNOSES  Final diagnoses:  Hyperglycemia  Balanitis      Irean Hong, MD 08/15/15 [phone]

## 2015-08-15 NOTE — ED Notes (Signed)
Pt. Will be driving home and will follow up with his pcp.

## 2015-08-15 NOTE — ED Notes (Signed)
Pt. States he checked BS at home and it read 476.  Pt. States he just started on tanzeum 50mg  one week ago.

## 2015-10-02 ENCOUNTER — Encounter: Payer: Self-pay | Admitting: Emergency Medicine

## 2015-10-02 ENCOUNTER — Emergency Department
Admission: EM | Admit: 2015-10-02 | Discharge: 2015-10-02 | Disposition: A | Discharge status: Home / Self Care | Payer: Medicaid Other | Attending: Emergency Medicine | Admitting: Emergency Medicine

## 2015-10-02 DIAGNOSIS — R600 Localized edema: Secondary | ICD-10-CM | POA: Diagnosis not present

## 2015-10-02 DIAGNOSIS — Z91119 Patient's noncompliance with dietary regimen due to unspecified reason: Secondary | ICD-10-CM

## 2015-10-02 DIAGNOSIS — Z9111 Patient's noncompliance with dietary regimen: Secondary | ICD-10-CM | POA: Diagnosis not present

## 2015-10-02 DIAGNOSIS — E1165 Type 2 diabetes mellitus with hyperglycemia: Secondary | ICD-10-CM | POA: Diagnosis not present

## 2015-10-02 DIAGNOSIS — Z79899 Other long term (current) drug therapy: Secondary | ICD-10-CM | POA: Diagnosis not present

## 2015-10-02 DIAGNOSIS — B372 Candidiasis of skin and nail: Secondary | ICD-10-CM | POA: Diagnosis not present

## 2015-10-02 DIAGNOSIS — R739 Hyperglycemia, unspecified: Secondary | ICD-10-CM

## 2015-10-02 DIAGNOSIS — R21 Rash and other nonspecific skin eruption: Secondary | ICD-10-CM | POA: Diagnosis present

## 2015-10-02 DIAGNOSIS — IMO0001 Reserved for inherently not codable concepts without codable children: Secondary | ICD-10-CM

## 2015-10-02 DIAGNOSIS — Z792 Long term (current) use of antibiotics: Secondary | ICD-10-CM | POA: Insufficient documentation

## 2015-10-02 DIAGNOSIS — Z794 Long term (current) use of insulin: Secondary | ICD-10-CM | POA: Insufficient documentation

## 2015-10-02 DIAGNOSIS — I10 Essential (primary) hypertension: Secondary | ICD-10-CM | POA: Diagnosis not present

## 2015-10-02 LAB — GLUCOSE, CAPILLARY
Glucose-Capillary: 332 mg/dL — ABNORMAL HIGH (ref 65–99)
Glucose-Capillary: 357 mg/dL — ABNORMAL HIGH (ref 65–99)
Glucose-Capillary: 267 mg/dL — ABNORMAL HIGH (ref 65–99)

## 2015-10-02 MED ORDER — SODIUM CHLORIDE 0.9 % IV BOLUS (SEPSIS)
500.0000 mL | Freq: Once | INTRAVENOUS | Status: AC
  Administered 2015-10-02: 500 mL via INTRAVENOUS

## 2015-10-02 MED ORDER — INSULIN ASPART 100 UNIT/ML ~~LOC~~ SOLN
5.0000 [IU] | Freq: Once | SUBCUTANEOUS | Status: AC
  Administered 2015-10-02: 5 [IU] via INTRAVENOUS
  Filled 2015-10-02: qty 5

## 2015-10-02 MED ORDER — FLUCONAZOLE 150 MG PO TABS: 150.0000 mg | ORAL_TABLET | Freq: Every day | ORAL | Status: AC

## 2015-10-02 NOTE — Discharge Instructions (Signed)
Diabetes Mellitus and Food It is important for you to manage your blood sugar (glucose) level. Your blood glucose level can be greatly affected by what you eat. Eating healthier foods in the appropriate amounts throughout the day at about the same time each day will help you control your blood glucose level. It can also help slow or prevent worsening of your diabetes mellitus. Healthy eating may even help you improve the level of your blood pressure and reach or maintain a healthy weight.  General recommendations for healthful eating and cooking habits include:  Eating meals and snacks regularly. Avoid going long periods of time without eating to lose weight.  Eating a diet that consists mainly of plant-based foods, such as fruits, vegetables, nuts, legumes, and whole grains.  Using low-heat cooking methods, such as baking, instead of high-heat cooking methods, such as deep frying. Work with your dietitian to make sure you understand how to use the Nutrition Facts information on food labels. HOW CAN FOOD AFFECT ME? Carbohydrates Carbohydrates affect your blood glucose level more than any other type of food. Your dietitian will help you determine how many carbohydrates to eat at each meal and teach you how to count carbohydrates. Counting carbohydrates is important to keep your blood glucose at a healthy level, especially if you are using insulin or taking certain medicines for diabetes mellitus. Alcohol Alcohol can cause sudden decreases in blood glucose (hypoglycemia), especially if you use insulin or take certain medicines for diabetes mellitus. Hypoglycemia can be a life-threatening condition. Symptoms of hypoglycemia (sleepiness, dizziness, and disorientation) are similar to symptoms of having too much alcohol.  If your health care provider has given you approval to drink alcohol, do so in moderation and use the following guidelines:  Women should not have more than one drink per day, and men  should not have more than two drinks per day. One drink is equal to:  12 oz of beer.  5 oz of wine.  1 oz of hard liquor.  Do not drink on an empty stomach.  Keep yourself hydrated. Have water, diet soda, or unsweetened iced tea.  Regular soda, juice, and other mixers might contain a lot of carbohydrates and should be counted. WHAT FOODS ARE NOT RECOMMENDED? As you make food choices, it is important to remember that all foods are not the same. Some foods have fewer nutrients per serving than other foods, even though they might have the same number of calories or carbohydrates. It is difficult to get your body what it needs when you eat foods with fewer nutrients. Examples of foods that you should avoid that are high in calories and carbohydrates but low in nutrients include:  Trans fats (most processed foods list trans fats on the Nutrition Facts label).  Regular soda.  Juice.  Candy.  Sweets, such as cake, pie, doughnuts, and cookies.  Fried foods. WHAT FOODS CAN I EAT? Eat nutrient-rich foods, which will nourish your body and keep you healthy. The food you should eat also will depend on several factors, including:  The calories you need.  The medicines you take.  Your weight.  Your blood glucose level.  Your blood pressure level.  Your cholesterol level. You should eat a variety of foods, including:  Protein.  Lean cuts of meat.  Proteins low in saturated fats, such as fish, egg whites, and beans. Avoid processed meats.  Fruits and vegetables.  Fruits and vegetables that may help control blood glucose levels, such as apples, mangoes, and  yams.  Dairy products.  Choose fat-free or low-fat dairy products, such as milk, yogurt, and cheese.  Grains, bread, pasta, and rice.  Choose whole grain products, such as multigrain bread, whole oats, and brown rice. These foods may help control blood pressure.  Fats.  Foods containing healthful fats, such as nuts,  avocado, olive oil, canola oil, and fish. DOES EVERYONE WITH DIABETES MELLITUS HAVE THE SAME MEAL PLAN? Because every person with diabetes mellitus is different, there is not one meal plan that works for everyone. It is very important that you meet with a dietitian who will help you create a meal plan that is just right for you.   This information is not intended to replace advice given to you by your health care provider. Make sure you discuss any questions you have with your health care provider.   Document Released: 03/22/2005 Document Revised: 07/16/2014 Document Reviewed: 05/22/2013 Elsevier Interactive Patient Education 2016 Else[employer] Inc[employer]  Cutaneous Candidiasis Cutaneous candidiasis is a condition in which there is an overgrowth of yeast (candida) on the skin. Yeast normally live on the skin, but in small enough numbers not to cause any symptoms. In certain cases, increased growth of the yeast may cause an actual yeast infection. This kind of infection usually occurs in areas of the skin that are constantly warm and moist, such as the armpits or the groin. Yeast is the most common cause of diaper rash in babies and in people who cannot control their bowel movements (incontinence). CAUSES  The fungus that most often causes cutaneous candidiasis is Candida albicans. Conditions that can increase the risk of getting a yeast infection of the skin include:  Obesity.  Pregnancy.  Diabetes.  Taking antibiotic medicine.  Taking birth control pills.  Taking steroid medicines.  Thyroid disease.  An iron or zinc deficiency.  Problems with the immune system. SYMPTOMS   Red, swollen area of the skin.  Bumps on the skin.  Itchiness. DIAGNOSIS  The diagnosis of cutaneous candidiasis is usually based on its appearance. Light scrapings of the skin may also be taken and viewed under a microscope to identify the presence of yeast. TREATMENT  Antifungal creams may be applied to the  infected skin. In severe cases, oral medicines may be needed.  HOME CARE INSTRUCTIONS   Keep your skin clean and dry.  Maintain a healthy weight.  If you have diabetes, keep your blood sugar under control. SEEK IMMEDIATE MEDICAL CARE IF:  Your rash continues to spread despite treatment.  You have a fever, chills, or abdominal pain.   This information is not intended to replace advice given to you by your health care provider. Make sure you discuss any questions you have with your health care provider.   Document Released: 03/13/2011 Document Revised: 09/17/2011 Document Reviewed: 12/27/2014 Elsevier Interactive Patient Education [employer]

## 2015-10-02 NOTE — ED Notes (Signed)
Patient now states he did take his insulin before eating breakfast this morning.

## 2015-10-02 NOTE — ED Notes (Signed)
Patient presents to the ED with groin redness, itching, and pain since January.  Patient states, "I thought it was a yeast infection but it's not getting better."  Patient is in no obvious distress at this time.

## 2015-10-02 NOTE — ED Notes (Signed)
Pt verbalized understanding of discharge instructions. NAD at this time. 

## 2015-10-02 NOTE — ED Provider Notes (Signed)
[facility] Emergency Department Provider Note  ____________________________________________  Time seen: Approximately 10:29 AM  I have reviewed the triage vital signs and the nursing notes.   HISTORY  Chief Complaint Rash    HPI LUCKY Barajas is a 60 y.o. male , NAD, presents to the emergency department with a two-month history of groin redness, itching. Was seen in this emergency department in early February and was noted to have balanitis. Patient has been using an over-the-counter anti-yeast cream that has not alleviated his symptoms. He has also been seen in this emergency department numerous times for hyperglycemia. He continues to be noncompliant with dietary restrictions. Does note that he took 60 units NovoLog this morning prior to going to [employer] for breakfast. He denies any chest pain, shortness of breath, fatigue, visual changes, headaches, abdominal pain, nausea, vomiting, diarrhea. Has not had any urethral discharge or changes in urinary patterns. Denies dysuria or hematuria.   Past Medical History  Diagnosis Date  . MI (myocardial infarction) (HCC)   . Hypertension   . Diabetes mellitus without complication (HCC)   . Gout   . RA (rheumatoid arthritis) (HCC)   . Stroke [facility])     left facal droop    There are no active problems to display for this patient.   Past Surgical History  Procedure Laterality Date  . Coronary angioplasty with stent placement    . Circumcision      Current Outpatient Rx  Name  Route  Sig  Dispense  Refill  . amLODipine (NORVASC) 10 MG tablet   Oral   Take 10 mg by mouth daily.         . fluconazole (DIFLUCAN) 150 MG tablet   Oral   Take 1 tablet (150 mg total) by mouth daily.   7 tablet   0   . furosemide (LASIX) 20 MG tablet   Oral   Take 20 mg by mouth 2 (two) times daily.         Marland Kitchen HYDROcodone-acetaminophen (NORCO/VICODIN) 5-325 MG per tablet   Oral   Take 1-2 tablets by mouth every 6  (six) hours as needed for pain.   15 tablet   0   . insulin glargine (LANTUS) 100 UNIT/ML injection   Subcutaneous   Inject 110-200 Units into the skin at bedtime.         . insulin lispro (HUMALOG) 100 UNIT/ML injection   Subcutaneous   Inject 12 Units into the skin 3 (three) times daily before meals.         . metoprolol succinate (TOPROL-XL) 50 MG 24 hr tablet   Oral   Take 50 mg by mouth 2 (two) times daily. Take with or immediately following a meal.         . prasugrel (EFFIENT) 10 MG TABS   Oral   Take by mouth.         . quinapril (ACCUPRIL) 20 MG tablet   Oral   Take 20 mg by mouth at bedtime.         . rosuvastatin (CRESTOR) 40 MG tablet   Oral   Take 40 mg by mouth daily.           Allergies Iodine  No family history on file.  Social History Social History  Substance Use Topics  . Smoking status: Never Smoker   . Smokeless tobacco: Never Used  . Alcohol Use: No     Review of Systems  Constitutional: No fever/chills, fatigue  Eyes: No visual changes. Cardiovascular: No chest pain, palpitations, heart racing. Respiratory: No shortness of breath, wheezing.  Gastrointestinal: No abdominal pain.  No nausea, vomiting.  No diarrhea.   Genitourinary: Negative for dysuria, hematuria. No urinary hesitancy, urgency or increased frequency. No urethral discharge Musculoskeletal: Negative for back pain.  Skin: Itching, irritated skin about penis and scrotum. Negative for skin sores, open wounds. Neurological: Negative for headaches, focal weakness or numbness. 10-point ROS otherwise negative.  ____________________________________________   PHYSICAL EXAM:  VITAL SIGNS: ED Triage Vitals  Enc Vitals Group     BP 10/02/15 0950 174/98 mmHg     Pulse Rate 10/02/15 0950 96     Resp 10/02/15 0950 20     Temp 10/02/15 0950 98.2 F (36.8 C)     Temp Source 10/02/15 0950 Oral     SpO2 10/02/15 0950 98 %     Weight 10/02/15 0950 275 lb (124.739 kg)      Height 10/02/15 0950 5\' 7"  (1.702 m)     Head Cir --      Peak Flow --      Pain Score 10/02/15 0951 6     Pain Loc --      Pain Edu? --      Excl. in GC? --    Physical exam completed in the presence of 10/04/15, RN.  Constitutional: Alert and oriented. Well appearing and in no acute distress. Eyes: Conjunctivae are normal. PERRL.  Head: Atraumatic. Neck: Supple with FROM.  Hematological/Lymphatic/Immunilogical: No cervical nor inguinal lymphadenopathy. Cardiovascular: Normal rate, regular rhythm. Normal S1 and S2.  Good peripheral circulation bilaterally. Respiratory: Normal respiratory effort without tachypnea or retractions. Lungs CTAB with breath sounds noted throughout all lung fields. Gastrointestinal: Soft and nontender in all quadrants. No distention, guarding.  Musculoskeletal: Chronic lower extremity edema bilaterally with 1+ pitting.   No joint effusions. Neurologic:  Normal speech and language. No gross focal neurologic deficits are appreciated.  Skin:  Skin about the penis and scrotum with clear/white film causing skin to be shiny. No overt rash or skin lesions. Psychiatric: Mood and affect are normal. Speech and behavior are normal. Patient exhibits appropriate insight and judgement.   ____________________________________________   LABS (all labs ordered are listed, but only abnormal results are displayed)  Labs Reviewed  GLUCOSE, CAPILLARY - Abnormal; Notable for the following:    Glucose-Capillary 332 (*)    All other components within normal limits  GLUCOSE, CAPILLARY - Abnormal; Notable for the following:    Glucose-Capillary 357 (*)    All other components within normal limits  GLUCOSE, CAPILLARY - Abnormal; Notable for the following:    Glucose-Capillary 267 (*)    All other components within normal limits  CBG MONITORING, ED  CBG MONITORING, ED    ____________________________________________  EKG  None ____________________________________________  RADIOLOGY  None ____________________________________________    PROCEDURES  Procedure(s) performed: None    Medications  sodium chloride 0.9 % bolus 500 mL (500 mLs Intravenous New Bag/Given 10/02/15 1217)  insulin aspart (novoLOG) injection 5 Units (5 Units Intravenous Given 10/02/15 1217)   ----------------------------------------- 1:17 PM on 10/02/2015 -----------------------------------------  Patient has done well without any adverse events throughout his ED course today. I spoke with him prior to discharge and he denies any chest pain, shortness breath, dizziness, changes in vision, abdominal pain. He feels well and is ready for discharge.  ____________________________________________   INITIAL IMPRESSION / ASSESSMENT AND PLAN / ED COURSE  Pertinent labs & imaging results that  were available during my care of the patient were reviewed by me and considered in my medical decision making (see chart for details). No evidence of DKA per the patient's vital signs and overall benign physical examination.  Patient's diagnosis is consistent with genitourinary yeast infection due to uncontrolled diabetes and hyperglycemia. Patient is significantly noncompliant with dietary regimens that cause his sugars to continue to be uncontrolled. Patient will be discharged home with prescriptions for fluconazole to take 1 tablet daily 7 days. Patient is to follow with his primary care provider within 48 hours for repeat blood sugar evaluation as well as to ensure patient is tolerant of current medications. Patient verbalizes understanding of these instructions and reports that he will follow-up with his primary care provider this week. Patient was also advised that he take the rest of the day off and should not drive and he verbalizes understanding of such. Reiterated to the patient the  importance of dietary compliance with regulating his blood sugars and keeping his diabetes under control. Patient is given ED precautions to return to the ED for any worsening or new symptoms.    ____________________________________________  FINAL CLINICAL IMPRESSION(S) / ED DIAGNOSES  Final diagnoses:  Skin yeast infection  Hyperglycemia  Uncontrolled diabetes mellitus type 2 without complications, unspecified long term insulin use status (HCC)  Noncompliance of patient with dietary regimen      NEW MEDICATIONS STARTED DURING THIS VISIT:  New Prescriptions   FLUCONAZOLE (DIFLUCAN) 150 MG TABLET    Take 1 tablet (150 mg total) by mouth daily.        Hope Pigeon, PA-C 10/02/15 1320  Rockne Menghini, MD 10/02/15 1450

## 2015-10-02 NOTE — ED Notes (Signed)
Patient states he did not take his insulin this morning and ate breakfast at [employer] right before coming to the ED which consisted of pancakes, syrup, eggs and bacon.  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

## 2015-10-05 ENCOUNTER — Encounter: Payer: Medicaid Other | Attending: Internal Medicine | Admitting: *Deleted

## 2015-10-05 ENCOUNTER — Encounter: Payer: Self-pay | Admitting: *Deleted

## 2015-10-05 VITALS — BP 126/80 | Ht 67.0 in | Wt 274.5 lb

## 2015-10-05 DIAGNOSIS — E119 Type 2 diabetes mellitus without complications: Secondary | ICD-10-CM | POA: Insufficient documentation

## 2015-10-05 DIAGNOSIS — E1165 Type 2 diabetes mellitus with hyperglycemia: Secondary | ICD-10-CM

## 2015-10-05 DIAGNOSIS — Z794 Long term (current) use of insulin: Secondary | ICD-10-CM

## 2015-10-05 NOTE — Patient Instructions (Addendum)
Check blood sugars 2 x day before breakfast and 2 hrs after supper every day Exercise: Continue biking for 60 minutes 2-3 days a week Eat 3 meals day,  1-2  snacks a day Space meals 4-6 hours apart Don't skip meals Limit foods high in fat Complete 3 Day Food Record and bring to next appt Bring blood sugar records to the next appointment Carry fast acting glucose and a snack at all times Rotate injection sites Hold pen in place for 5-10 seconds after injection Return for appointment on: Wednesday October 26, 2015 at 10:30 am with [facility] (dietitian)

## 2015-10-05 NOTE — Progress Notes (Signed)
Diabetes Self-Management Education  Visit Type: First/Initial  Appt. Start Time: 1335 Appt. End Time: 1510  10/05/2015  [name], identified by name and date of birth, is a 60 y.o. male with a diagnosis of Diabetes: Type 2.   ASSESSMENT  Blood pressure 126/80, height 5\' 7"  (1.702 m), weight 274 lb 8 oz (124.512 kg). Body mass index is 42.98 kg/(m^2).      Diabetes Self-Management Education - 10/05/15 1533    Visit Information   Visit Type First/Initial   Initial Visit   Diabetes Type Type 2   Are you currently following a meal plan? Yes   What type of meal plan do you follow? "healthy breakfast, lunch , dinner, snacks   Are you taking your medications as prescribed? Yes   Date Diagnosed pt unsure   Health Coping   How would you rate your overall health? Fair   Psychosocial Assessment   Patient Belief/Attitude about Diabetes Motivated to manage diabetes  "I don't like it"   Self-care barriers None   Self-management support Doctor's office   Patient Concerns Nutrition/Meal planning;Glycemic Control;Healthy Lifestyle;Weight Control   Special Needs None   Preferred Learning Style Auditory;Visual;Hands on   Learning Readiness Ready   How often do you need to have someone help you when you read instructions, pamphlets, or other written materials from your doctor or pharmacy? 1 - Never   What is the last grade level you completed in school? 10th    Complications   Last HgB A1C per patient/outside source 11.2 %  09/20/15   How often do you check your blood sugar? 3-4 times / week  Pt checked FBG today but hasn't checked BG in last 3 days. FBG today - 446 mg/dL.    Fasting Blood glucose range (mg/dL) 09/22/15   Have you had a dilated eye exam in the past 12 months? Yes   Have you had a dental exam in the past 12 months? No  has few teeth   Are you checking your feet? Yes   How many days per week are you checking your feet? 4   Dietary Intake   Breakfast skips or eats out -  >191 bacon, egg whites, 2 slices wheat toast   Snack (morning) chips, popcorn, fruit   Lunch eats out - [place], [employer], [employer]   M and vegetables   Snack (evening) sherbert   Beverage(s) water, coffee, unsweetened tea   Exercise   Exercise Type Light (walking / raking leaves)  walking/riding bike   How many days per week to you exercise? 3   How many minutes per day do you exercise? 60   Total minutes per week of exercise 180   Patient Education   Previous Diabetes Education Yes (please comment)  Here at [facility] - doesn't remember when   Disease state  Explored patient's options for treatment of their diabetes   Nutrition management  Role of diet in the treatment of diabetes and the relationship between the three main macronutrients and blood glucose level;Food label reading, portion sizes and measuring food.;Information on hints to eating out and maintain blood glucose control.   Physical activity and exercise  Role of exercise on diabetes management, blood pressure control and cardiac health.   Medications Taught/reviewed insulin injection, site rotation, insulin storage and needle disposal.;Reviewed patients medication for diabetes, action, purpose, timing of dose and side effects.   Monitoring Purpose and frequency of SMBG.;Identified appropriate SMBG and/or A1C goals.  Acute complications Taught treatment of hypoglycemia - the 15 rule.   Chronic complications Relationship between chronic complications and blood glucose control   Psychosocial adjustment Identified and addressed patients feelings and concerns about diabetes   Individualized Goals (developed by patient)   Reducing Risk Improve blood sugars Lose weight Lead a healthier lifestyle Become more fit   Outcomes   Expected Outcomes Demonstrated interest in learning. Expect positive outcomes   Future DMSE 2 wks      Individualized Plan for Diabetes Self-Management Training:   Learning Objective:   Patient will have a greater understanding of diabetes self-management. Patient education plan is to attend individual and/or group sessions per assessed needs and concerns.   Plan:   Patient Instructions  Check blood sugars 2 x day before breakfast and 2 hrs after supper every day Exercise: Continue biking for 60 minutes 2-3 days a week Eat 3 meals day,  1-2  snacks a day Space meals 4-6 hours apart Don't skip meals Limit foods high in fat Complete 3 Day Food Record and bring to next appt Bring blood sugar records to the next appointment Carry fast acting glucose and a snack at all times Rotate injection sites Hold pen in place for 5-10 seconds after injection Return for appointment on: Wednesday October 26, 2015 at 10:30 am with [facility] (dietitian)   Expected Outcomes:  Demonstrated interest in learning. Expect positive outcomes  Education material provided:  General Meal Planning Guidelines Simple Meal Plan 3 Day Food Record Glucose tablets Symptoms, causes and treatments of Hypoglycemia   If problems or questions, patient to contact team via:   Sharion Settler, RN, CCM, CDE [phone]  Future DSME appointment:  October 26, 2015 with dietitian

## 2015-10-11 ENCOUNTER — Ambulatory Visit (INDEPENDENT_AMBULATORY_CARE_PROVIDER_SITE_OTHER): Payer: Medicaid Other | Admitting: Sports Medicine

## 2015-10-11 DIAGNOSIS — E119 Type 2 diabetes mellitus without complications: Secondary | ICD-10-CM | POA: Diagnosis not present

## 2015-10-11 DIAGNOSIS — B351 Tinea unguium: Secondary | ICD-10-CM | POA: Diagnosis not present

## 2015-10-11 DIAGNOSIS — M79672 Pain in left foot: Secondary | ICD-10-CM

## 2015-10-11 DIAGNOSIS — M79671 Pain in right foot: Secondary | ICD-10-CM

## 2015-10-11 NOTE — Progress Notes (Signed)
Patient ID: Derrick Barajas, male   DOB: Nov 16, 1955, 60 y.o.   MRN: [id number]   Subjective: Derrick Barajas is a 60 y.o. male patient with history of type 2 diabetes who presents to office today complaining of long, painful nails  while ambulating in shoes; unable to trim. Patient states that the glucose reading has been and high and low. Patient denies any new changes in medication or new problems. Patient denies any new cramping, numbness, burning or tingling in the legs.  There are no active problems to display for this patient.  Current Outpatient Prescriptions on File Prior to Visit  Medication Sig Dispense Refill  . amLODipine (NORVASC) 10 MG tablet Take 10 mg by mouth daily.    Marland Kitchen aspirin 81 MG tablet Take 81 mg by mouth daily.    . empagliflozin (JARDIANCE) 25 MG TABS tablet Take 25 mg by mouth daily.    . furosemide (LASIX) 20 MG tablet Take 20 mg by mouth daily.    . isosorbide mononitrate (IMDUR) 30 MG 24 hr tablet Take 30 mg by mouth 2 (two) times daily.  5  . JANUVIA 100 MG tablet Take 100 mg by mouth daily.  2  . LEVEMIR FLEXTOUCH 100 UNIT/ML Pen Inject 60 Units into the skin 2 (two) times daily.  2  . levothyroxine (SYNTHROID, LEVOTHROID) 125 MCG tablet Take 125 mcg by mouth daily.  2  . metFORMIN (GLUCOPHAGE) 1000 MG tablet Take 1,000 mg by mouth 2 (two) times daily.  2  . metoprolol succinate (TOPROL-XL) 100 MG 24 hr tablet Take 100 mg by mouth daily.  5  . nitroGLYCERIN (NITROSTAT) 0.4 MG SL tablet Place 0.4 mg under the tongue every 5 (five) minutes as needed. Reported on 10/05/2015    . prasugrel (EFFIENT) 10 MG TABS Take 10 mg by mouth daily.     . ramipril (ALTACE) 10 MG capsule Take 10 mg by mouth daily.  2  . RANEXA 500 MG 12 hr tablet Take 500 mg by mouth 2 (two) times daily.  5  . rosuvastatin (CRESTOR) 20 MG tablet Take 20 mg by mouth daily.  5  . TANZEUM 50 MG PEN Inject 50 mg into the skin once a week.  2   No current facility-administered medications on file prior to  visit.   Allergies  Allergen Reactions  . Iodine Nausea And Vomiting  . Shrimp [Shellfish Allergy] Nausea And Vomiting   Labs: HEMOGLOBIN A1C- No recent lab  Objective: General: Patient is awake, alert, and oriented x 3 and in no acute distress.  Integument: Skin is warm, dry and supple bilateral. Nails are tender, long, thickened and  dystrophic with subungual debris, consistent with onychomycosis, 1-5 bilateral. No signs of infection. No open lesions or preulcerative lesions present bilateral. Remaining integument unremarkable.  Vasculature:  Dorsalis Pedis pulse 2/4 bilateral. Posterior Tibial pulse  2/4 bilateral.  Capillary fill time <3 sec 1-5 bilateral. Positive hair growth to the level of the digits. Temperature gradient within normal limits. No varicosities present bilateral. No edema present bilateral.   Neurology: The patient has intact sensation measured with a 5.07/10g Semmes Weinstein Monofilament at all pedal sites bilateral . Vibratory sensation intact bilateral with tuning fork. No Babinski sign present bilateral.   Musculoskeletal: No gross pedal deformities noted bilateral. Muscular strength 5/5 in all lower extremity muscular groups bilateral without pain or limitation on range of motion . No tenderness with calf compression bilateral.  Assessment and Plan: Problem List Items Addressed This  Visit    None    Visit Diagnoses    Dermatophytosis, nail    -  Primary    Foot pain, bilateral        Diabetes mellitus without complication (HCC)          -Examined patient. -Discussed and educated patient on diabetic foot care, especially with  regards to the vascular, neurological and musculoskeletal systems.  -Stressed the importance of good glycemic control and the detriment of not  controlling glucose levels in relation to the foot. -Mechanically debrided all nails 1-5 bilateral using sterile nail nipper and filed with dremel without incident  -Cont with daily  skin emollients -Answered all patient questions -Patient to return in 3 months for at risk foot care -Patient advised to call the office if any problems or questions arise in the meantime.  [name], DPM

## 2015-10-26 ENCOUNTER — Encounter: Payer: Medicaid Other | Attending: Internal Medicine | Admitting: Dietician

## 2015-10-26 ENCOUNTER — Encounter: Payer: Self-pay | Admitting: Dietician

## 2015-10-26 VITALS — BP 130/80 | Ht 67.0 in | Wt 275.4 lb

## 2015-10-26 DIAGNOSIS — I219 Acute myocardial infarction, unspecified: Secondary | ICD-10-CM | POA: Insufficient documentation

## 2015-10-26 DIAGNOSIS — Z794 Long term (current) use of insulin: Secondary | ICD-10-CM

## 2015-10-26 DIAGNOSIS — E119 Type 2 diabetes mellitus without complications: Secondary | ICD-10-CM | POA: Diagnosis not present

## 2015-10-26 DIAGNOSIS — E1165 Type 2 diabetes mellitus with hyperglycemia: Secondary | ICD-10-CM

## 2015-10-26 DIAGNOSIS — I10 Essential (primary) hypertension: Secondary | ICD-10-CM | POA: Insufficient documentation

## 2015-10-26 NOTE — Progress Notes (Signed)
Diabetes Self-Management Education  Visit Type:  Follow-up  Appt. Start Time: 1030 Appt. End Time: 1130  10/26/2015  Derrick Barajas, identified by name and date of birth, is a 60 y.o. male with a diagnosis of Diabetes:  .   ASSESSMENT  Blood pressure 130/80, height 5\' 7"  (1.702 m), weight 275 lb 6.4 oz (124.921 kg). Body mass index is 43.12 kg/(m^2).       Diabetes Self-Management Education - 10/26/15 1040    Complications   How often do you check your blood sugar? 1-2 times/day   Fasting Blood glucose range (mg/dL) 10/28/15   Postprandial Blood glucose range (mg/dL) 02-725;366-440   Number of hypoglycemic episodes per month --  Patient reports feeling low BG symptoms when fasting BG was 79 on 10/14/15   Have you had a dilated eye exam in the past 12 months? Yes   Have you had a dental exam in the past 12 months? No   Are you checking your feet? Yes   How many days per week are you checking your feet? 7   Dietary Intake   Breakfast 3 meals and 1-2 snacks daily   Exercise   Exercise Type Light (walking / raking leaves)   How many days per week to you exercise? 3   How many minutes per day do you exercise? 60   Total minutes per week of exercise 180   Patient Education   Nutrition management  Role of diet in the treatment of diabetes and the relationship between the three main macronutrients and blood glucose level;Food label reading, portion sizes and measuring food.;Information on hints to eating out and maintain blood glucose control.;Meal options for control of blood glucose level and chronic complications.   Medications Reviewed patients medication for diabetes, action, purpose, timing of dose and side effects.  Patient reports taking extra units of Levemir insulin when blood sugar was high, then had hypoglycemia symptoms the next morning with a reading of 79.   Acute complications Taught treatment of hypoglycemia - the 15 rule.  reviewed hypoglycemia treatment   Outcomes   Program Status Completed      Learning Objective:  Patient will have a greater understanding of diabetes self-management. Patient education plan is to attend individual and/or group sessions per assessed needs and concerns.   Plan:   Patient Instructions   Keep making healthy, lowfat choices such as 12/14/15 versions of meats, egg whites, baked or grilled chicken; limit or avoid fried foods.  Eat only small portions of sweets -- try eating 1/2 of a portion and save the rest for the next day. Eat fruits in place of dessert foods.   Choose lowfat, "light", or "slow-churned" ice cream, and keep to 1 scoop. Add fresh, frozen, or low-sugar canned fruit instead of more ice cream. Try frozen fruits with small portion of ice cream or light vanilla yogurt.    Expected Outcomes:  Demonstrated interest in learning. Expect positive outcomes  Education material provided: basic meal planner page        Quick and Healthy Meal Ideas        Goals-instructions  If problems or questions, patient to contact team via:  Phone  Future DSME appointment: -  none scheduled

## 2015-10-26 NOTE — Patient Instructions (Signed)
   Keep making healthy, lowfat choices such as [place] versions of meats, egg whites, baked or grilled chicken; limit or avoid fried foods.  Eat only small portions of sweets -- try eating 1/2 of a portion and save the rest for the next day. Eat fruits in place of dessert foods.   Choose lowfat, "light", or "slow-churned" ice cream, and keep to 1 scoop. Add fresh, frozen, or low-sugar canned fruit instead of more ice cream. Try frozen fruits with small portion of ice cream or light vanilla yogurt.

## 2015-11-20 ENCOUNTER — Emergency Department: Payer: Medicaid Other

## 2015-11-20 ENCOUNTER — Emergency Department
Admission: EM | Admit: 2015-11-20 | Discharge: 2015-11-20 | Disposition: A | Discharge status: Home / Self Care | Payer: Medicaid Other | Attending: Emergency Medicine | Admitting: Emergency Medicine

## 2015-11-20 ENCOUNTER — Encounter: Payer: Self-pay | Admitting: Urgent Care

## 2015-11-20 DIAGNOSIS — Z7982 Long term (current) use of aspirin: Secondary | ICD-10-CM | POA: Diagnosis not present

## 2015-11-20 DIAGNOSIS — Z79899 Other long term (current) drug therapy: Secondary | ICD-10-CM | POA: Insufficient documentation

## 2015-11-20 DIAGNOSIS — M109 Gout, unspecified: Secondary | ICD-10-CM | POA: Diagnosis not present

## 2015-11-20 DIAGNOSIS — Z7984 Long term (current) use of oral hypoglycemic drugs: Secondary | ICD-10-CM | POA: Diagnosis not present

## 2015-11-20 DIAGNOSIS — M25571 Pain in right ankle and joints of right foot: Secondary | ICD-10-CM | POA: Diagnosis present

## 2015-11-20 DIAGNOSIS — I2510 Atherosclerotic heart disease of native coronary artery without angina pectoris: Secondary | ICD-10-CM | POA: Insufficient documentation

## 2015-11-20 DIAGNOSIS — I110 Hypertensive heart disease with heart failure: Secondary | ICD-10-CM | POA: Diagnosis not present

## 2015-11-20 DIAGNOSIS — I509 Heart failure, unspecified: Secondary | ICD-10-CM | POA: Insufficient documentation

## 2015-11-20 DIAGNOSIS — Z8673 Personal history of transient ischemic attack (TIA), and cerebral infarction without residual deficits: Secondary | ICD-10-CM | POA: Diagnosis not present

## 2015-11-20 DIAGNOSIS — I252 Old myocardial infarction: Secondary | ICD-10-CM | POA: Insufficient documentation

## 2015-11-20 DIAGNOSIS — E785 Hyperlipidemia, unspecified: Secondary | ICD-10-CM | POA: Insufficient documentation

## 2015-11-20 DIAGNOSIS — E1159 Type 2 diabetes mellitus with other circulatory complications: Secondary | ICD-10-CM | POA: Diagnosis not present

## 2015-11-20 MED ORDER — COLCHICINE 0.6 MG PO TABS: 0.6000 mg | ORAL_TABLET | Freq: Every day | ORAL | Status: DC

## 2015-11-20 MED ORDER — COLCHICINE 0.6 MG PO TABS
0.6000 mg | ORAL_TABLET | Freq: Once | ORAL | Status: AC
  Administered 2015-11-20: 0.6 mg via ORAL
  Filled 2015-11-20: qty 1

## 2015-11-20 MED ORDER — OXYCODONE-ACETAMINOPHEN 5-325 MG PO TABS
1.0000 | ORAL_TABLET | Freq: Once | ORAL | Status: AC
  Administered 2015-11-20: 1 via ORAL
  Filled 2015-11-20: qty 1

## 2015-11-20 MED ORDER — OXYCODONE-ACETAMINOPHEN 5-325 MG PO TABS: 1.0000 | ORAL_TABLET | Freq: Four times a day (QID) | ORAL | Status: DC | PRN

## 2015-11-20 NOTE — ED Provider Notes (Addendum)
[facility] Emergency Department Provider Note  ____________________________________________   I have reviewed the triage vital signs and the nursing notes.   HISTORY  Chief Complaint Leg Pain    HPI Derrick Barajas is a 60 y.o. male resents today complaining of pain to his right ankle. Patient states he had this multiple times before. Does have a history of gout he states. He states always in that ankle. This pain started last night. Same pain as he always has with his gout. He denies any trauma or fever, he states that usually gets better with "whenever meds they give me for gout". Pain radiates "everywhere" but really is isolated to the right ankle. Has not had any systemic symptoms. Does state that he is not compliant with a gout diet. Has chronic mild lower extremity edema which is unchanged. Denies any chest pain or shortness of breath. Pain is not in his calf it is in his ankle.     Past Medical History  Diagnosis Date  . MI (myocardial infarction) (HCC)   . Hypertension   . Diabetes mellitus without complication (HCC)   . Gout   . RA (rheumatoid arthritis) (HCC)   . Stroke [facility])     left facal droop  . Hyperlipidemia   . CHF (congestive heart failure) (HCC)   . Sleep apnea   . Thyroid disease   . CAD (coronary artery disease)     Patient Active Problem List   Diagnosis Date Noted  . Controlled type 2 diabetes mellitus without complication (HCC) 10/26/2015  . BP (high blood pressure) 10/26/2015  . Myocardial infarction [facility]) 10/26/2015    Past Surgical History  Procedure Laterality Date  . Coronary angioplasty with stent placement    . Circumcision    . Tonsillectomy      Current Outpatient Rx  Name  Route  Sig  Dispense  Refill  . ACCU-CHEK AVIVA PLUS test strip      USE UTD TID      11     Dispense as written.   Marland Kitchen amLODipine (NORVASC) 10 MG tablet   Oral   Take 10 mg by mouth daily.         Marland Kitchen aspirin 81 MG tablet    Oral   Take 81 mg by mouth daily.         . B-D ULTRAFINE III SHORT PEN 31G X 8 MM MISC      USE UTD TO BID      11     Dispense as written.   . furosemide (LASIX) 20 MG tablet   Oral   Take 20 mg by mouth daily.         . INVOKANA 300 MG TABS tablet   Oral   Take 1 tablet by mouth daily.      0     Dispense as written.   . isosorbide mononitrate (IMDUR) 30 MG 24 hr tablet   Oral   Take 30 mg by mouth 2 (two) times daily.      5   . JANUVIA 100 MG tablet   Oral   Take 100 mg by mouth daily.      2     Dispense as written.   Marland Kitchen LEVEMIR FLEXTOUCH 100 UNIT/ML Pen   Subcutaneous   Inject 60 Units into the skin 2 (two) times daily.      2     Dispense as written.   Marland Kitchen levothyroxine (SYNTHROID, LEVOTHROID) 125  MCG tablet   Oral   Take 125 mcg by mouth daily.      2   . metFORMIN (GLUCOPHAGE) 1000 MG tablet   Oral   Take 1,000 mg by mouth 2 (two) times daily.      2   . metoprolol succinate (TOPROL-XL) 100 MG 24 hr tablet   Oral   Take 100 mg by mouth daily.      5   . nitroGLYCERIN (NITROSTAT) 0.4 MG SL tablet   Sublingual   Place 0.4 mg under the tongue every 5 (five) minutes as needed. Reported on 10/05/2015         . prasugrel (EFFIENT) 10 MG TABS   Oral   Take 10 mg by mouth daily.          . ramipril (ALTACE) 10 MG capsule   Oral   Take 10 mg by mouth daily.      2   . RANEXA 500 MG 12 hr tablet   Oral   Take 500 mg by mouth 2 (two) times daily.      5     Dispense as written.   . rosuvastatin (CRESTOR) 20 MG tablet   Oral   Take 20 mg by mouth daily.      5   . TANZEUM 50 MG PEN   Subcutaneous   Inject 50 mg into the skin once a week.      2     Dispense as written.     Allergies Iodine and Shrimp  Family History  Problem Relation Age of Onset  . Diabetes Mother   . Diabetes Brother     Social History Social History  Substance Use Topics  . Smoking status: Never Smoker   . Smokeless tobacco: Never  Used  . Alcohol Use: 0.0 oz/week    0 Standard drinks or equivalent per week     Comment: rarely    Review of Systems Constitutional: No fever/chills Eyes: No visual changes. ENT: No sore throat. No stiff neck no neck pain Cardiovascular: Denies chest pain. Respiratory: Denies shortness of breath. Gastrointestinal:   no vomiting.  No diarrhea.  No constipation. Genitourinary: Negative for dysuria. Musculoskeletal: Negative lower extremity swellingOver baseline Skin: Negative for rash. Neurological: Negative for headaches, focal weakness or numbness. 10-point ROS otherwise negative.  ____________________________________________   PHYSICAL EXAM:  VITAL SIGNS: ED Triage Vitals  Enc Vitals Group     BP 11/20/15 0636 145/84 mmHg     Pulse Rate 11/20/15 0636 88     Resp 11/20/15 0636 18     Temp 11/20/15 0636 98.6 F (37 C)     Temp Source 11/20/15 0636 Oral     SpO2 11/20/15 0636 98 %     Weight 11/20/15 0636 275 lb (124.739 kg)     Height 11/20/15 0636 5\' 7"  (1.702 m)     Head Cir --      Peak Flow --      Pain Score 11/20/15 0636 10     Pain Loc --      Pain Edu? --      Excl. in GC? --     Constitutional: Alert and oriented. Well appearing and in no acute distress. Eyes: Conjunctivae are normal. PERRL. EOMI. Mouth/Throat: Mucous membranes are moist.  Oropharynx non-erythematous. Neck: No stridor.   Nontender with no meningismus Cardiovascular: Normal rate, regular rhythm. Grossly normal heart sounds.  Good peripheral circulation. Respiratory: Normal respiratory effort.  No retractions. Lungs  CTAB. Abdominal: Soft and nontender. No distention. No guarding no rebound Back:  There is no focal tenderness or step off there is no midline tenderness there are no lesions noted. there is no CVA tenderness Musculoskeletal: There is tenderness palpation to the right ankle, pain with ranging of the right ankle, there is very slight erythema it is not hot to touch, there is no  calf pain or swelling nothing to suggest DVT negative Homans sign and no knee pain or swelling or range of motion of the knee, he can range the ankle with some discomfort.. No joint effusions, no DVT signs strong distal pulses mild symmetric chronic bilateral pitting edema all compartments are soft,  Neurologic:  Baseline speech deficit from prior stroke noted. Skin:  Skin is warm, dry and intact. No rash noted. Psychiatric: Mood and affect are normal. Speech and behavior are normal.  ____________________________________________   LABS (all labs ordered are listed, but only abnormal results are displayed)  Labs Reviewed - No data to display ____________________________________________  EKG  I personally interpreted any EKGs ordered by me or triage  ____________________________________________  RADIOLOGY  I reviewed any imaging ordered by me or triage that were performed during my shift and, if possible, patient and/or family made aware of any abnormal findings. ____________________________________________   PROCEDURES  Procedure(s) performed: None  Critical Care performed: None  ____________________________________________   INITIAL IMPRESSION / ASSESSMENT AND PLAN / ED COURSE  Pertinent labs & imaging results that were available during my care of the patient were reviewed by me and considered in my medical decision making (see chart for details).  Patient with a history of gout presents with what he describes as a gout flare. He has tenderness and discomfort to the right ankle which is where he states his gout always attacks him. Obviously in no case is it ever possible to rule out infection and these flares without a arthrocentesis, and there is no blood testing can give a definitive answer as to the difference between an infection and a gout flare. Patient would prefer not to have a arthrocentesis and I think this is not unreasonable request given the history of gout in this  joint, the exact similar to symptoms, the pain without any inciting event, he is not compliant with a gouty diet, and the clinical correlation between this and his prior gouty flares and gouty flares in general. He does understand the limitations A workup for this particular pathology without arthrocentesis presents. Patient does understand that there is a slight chance of it could be an infection there and he is made to understand that if he feels in any way worse including fever or other complaints including pain going up the leg difficulty walking etc. he needs to return to the emergency room. He is also made aware of any not to drive on narcotic pain medication. I have asked him to follow up with his doctor tomorrow for recheck without fail and we'll treat him as a gouty flare which she believes strongly this is an which I believe also is most likely. Nothing to suggest DVT compartment syndrome cellulitis or acute vascular insufficiency at this time.  ----------------------------------------- 9:04 AM on 11/20/2015 -----------------------------------------  Patient has much less pain after colchicine and Percocet. X-ray findings are noted. There is bilateral chronic edema which appears symmetric. Patient requesting discharge  FINAL CLINICAL IMPRESSION(S) / ED DIAGNOSES  Final diagnoses:  None      This chart was dictated using voice recognition software.  Despite best efforts to proofread,  errors can occur which can change meaning.     Jeanmarie Plant, MD 11/20/15 5625  Jeanmarie Plant, MD 11/20/15 [phone]

## 2015-11-20 NOTE — ED Notes (Signed)
Patient presents to the ED with c/o RLE pain since yesterday. Pain extends from knee down into ankle area. Patient states, "I think I have the gout".

## 2015-11-20 NOTE — Discharge Instructions (Signed)
At this time it appears that you have gout. As we discussed without sticking a needle into the joint is impossible to know for sure that that is the most reasonable expiration for your pain. If you have increased pain, fever, swelling the changes over time or you feel worse in any way please return immediately to the emergency department. Do not fail to follow-up with your doctor tomorrow for recheck. Take the medications as prescribed and do not drive while taking them.  Gout Gout is an inflammatory arthritis caused by a buildup of uric acid crystals in the joints. Uric acid is a chemical that is normally present in the blood. When the level of uric acid in the blood is too high it can form crystals that deposit in your joints and tissues. This causes joint redness, soreness, and swelling (inflammation). Repeat attacks are common. Over time, uric acid crystals can form into masses (tophi) near a joint, destroying bone and causing disfigurement. Gout is treatable and often preventable. CAUSES  The disease begins with elevated levels of uric acid in the blood. Uric acid is produced by your body when it breaks down a naturally found substance called purines. Certain foods you eat, such as meats and fish, contain high amounts of purines. Causes of an elevated uric acid level include:  Being passed down from parent to child (heredity).  Diseases that cause increased uric acid production (such as obesity, psoriasis, and certain cancers).  Excessive alcohol use.  Diet, especially diets rich in meat and seafood.  Medicines, including certain cancer-fighting medicines (chemotherapy), water pills (diuretics), and aspirin.  Chronic kidney disease. The kidneys are no longer able to remove uric acid well.  Problems with metabolism. Conditions strongly associated with gout include:  Obesity.  High blood pressure.  High cholesterol.  Diabetes. Not everyone with elevated uric acid levels gets gout. It  is not understood why some people get gout and others do not. Surgery, joint injury, and eating too much of certain foods are some of the factors that can lead to gout attacks. SYMPTOMS   An attack of gout comes on quickly. It causes intense pain with redness, swelling, and warmth in a joint.  Fever can occur.  Often, only one joint is involved. Certain joints are more commonly involved:  Base of the big toe.  Knee.  Ankle.  Wrist.  Finger. Without treatment, an attack usually goes away in a few days to weeks. Between attacks, you usually will not have symptoms, which is different from many other forms of arthritis. DIAGNOSIS  Your caregiver will suspect gout based on your symptoms and exam. In some cases, tests may be recommended. The tests may include:  Blood tests.  Urine tests.  X-rays.  Joint fluid exam. This exam requires a needle to remove fluid from the joint (arthrocentesis). Using a microscope, gout is confirmed when uric acid crystals are seen in the joint fluid. TREATMENT  There are two phases to gout treatment: treating the sudden onset (acute) attack and preventing attacks (prophylaxis).  Treatment of an Acute Attack.  Medicines are used. These include anti-inflammatory medicines or steroid medicines.  An injection of steroid medicine into the affected joint is sometimes necessary.  The painful joint is rested. Movement can worsen the arthritis.  You may use warm or cold treatments on painful joints, depending which works best for you.  Treatment to Prevent Attacks.  If you suffer from frequent gout attacks, your caregiver may advise preventive medicine. These medicines are  started after the acute attack subsides. These medicines either help your kidneys eliminate uric acid from your body or decrease your uric acid production. You may need to stay on these medicines for a very long time.  The early phase of treatment with preventive medicine can be  associated with an increase in acute gout attacks. For this reason, during the first few months of treatment, your caregiver may also advise you to take medicines usually used for acute gout treatment. Be sure you understand your caregiver's directions. Your caregiver may make several adjustments to your medicine dose before these medicines are effective.  Discuss dietary treatment with your caregiver or dietitian. Alcohol and drinks high in sugar and fructose and foods such as meat, poultry, and seafood can increase uric acid levels. Your caregiver or dietitian can advise you on drinks and foods that should be limited. HOME CARE INSTRUCTIONS   Do not take aspirin to relieve pain. This raises uric acid levels.  Only take over-the-counter or prescription medicines for pain, discomfort, or fever as directed by your caregiver.  Rest the joint as much as possible. When in bed, keep sheets and blankets off painful areas.  Keep the affected joint raised (elevated).  Apply warm or cold treatments to painful joints. Use of warm or cold treatments depends on which works best for you.  Use crutches if the painful joint is in your leg.  Drink enough fluids to keep your urine clear or pale yellow. This helps your body get rid of uric acid. Limit alcohol, sugary drinks, and fructose drinks.  Follow your dietary instructions. Pay careful attention to the amount of protein you eat. Your daily diet should emphasize fruits, vegetables, whole grains, and fat-free or low-fat milk products. Discuss the use of coffee, vitamin C, and cherries with your caregiver or dietitian. These may be helpful in lowering uric acid levels.  Maintain a healthy body weight. SEEK MEDICAL CARE IF:   You develop diarrhea, vomiting, or any side effects from medicines.  You do not feel better in 24 hours, or you are getting worse. SEEK IMMEDIATE MEDICAL CARE IF:   Your joint becomes suddenly more tender, and you have chills or a  fever. MAKE SURE YOU:   Understand these instructions.  Will watch your condition.  Will get help right away if you are not doing well or get worse.   This information is not intended to replace advice given to you by your health care provider. Make sure you discuss any questions you have with your health care provider.   Document Released: 06/22/2000 Document Revised: 07/16/2014 Document Reviewed: 02/06/2012 Elsevier Interactive Patient Education 2016 [employer]  Low-Purine Diet Purines are compounds that affect the level of uric acid in your body. A low-purine diet is a diet that is low in purines. Eating a low-purine diet can prevent the level of uric acid in your body from getting too high and causing gout or kidney stones or both. WHAT DO I NEED TO KNOW ABOUT THIS DIET?  Choose low-purine foods. Examples of low-purine foods are listed in the next section.  Drink plenty of fluids, especially water. Fluids can help remove uric acid from your body. Try to drink 8-16 cups (1.9-3.8 L) a day.  Limit foods high in fat, especially saturated fat, as fat makes it harder for the body to get rid of uric acid. Foods high in saturated fat include pizza, cheese, ice cream, whole milk, fried foods, and gravies. Choose foods that are  lower in fat and lean sources of protein. Use olive oil when cooking as it contains healthy fats that are not high in saturated fat.  Limit alcohol. Alcohol interferes with the elimination of uric acid from your body. If you are having a gout attack, avoid all alcohol.  Keep in mind that different people's bodies react differently to different foods. You will probably learn over time which foods do or do not affect you. If you discover that a food tends to cause your gout to flare up, avoid eating that food. You can more freely enjoy foods that do not cause problems. If you have any questions about a food item, talk to your dietitian or health care provider. WHICH  FOODS ARE LOW, MODERATE, AND HIGH IN PURINES? The following is a list of foods that are low, moderate, and high in purines. You can eat any amount of the foods that are low in purines. You may be able to have small amounts of foods that are moderate in purines. Ask your health care provider how much of a food moderate in purines you can have. Avoid foods high in purines. Grains  Foods low in purines: Enriched white bread, pasta, rice, cake, cornbread, popcorn.  Foods moderate in purines: Whole-grain breads and cereals, wheat germ, bran, oatmeal. Uncooked oatmeal. Dry wheat bran or wheat germ.  Foods high in purines: Pancakes, [place] toast, biscuits, muffins. Vegetables  Foods low in purines: All vegetables, except those that are moderate in purines.  Foods moderate in purines: Asparagus, cauliflower, spinach, mushrooms, green peas. Fruits  All fruits are low in purines. Meats and other Protein Foods  Foods low in purines: Eggs, nuts, peanut butter.  Foods moderate in purines: 80-90% lean beef, lamb, veal, pork, poultry, fish, eggs, peanut butter, nuts. Crab, lobster, oysters, and shrimp. Cooked dried beans, peas, and lentils.  Foods high in purines: Anchovies, sardines, herring, mussels, tuna, codfish, scallops, trout, and haddock. Derrick Barajas. Organ meats (such as liver or kidney). Tripe. Game meat. Goose. Sweetbreads. Dairy  All dairy foods are low in purines. Low-fat and fat-free dairy products are best because they are low in saturated fat. Beverages  Drinks low in purines: Water, carbonated beverages, tea, coffee, cocoa.  Drinks moderate in purines: Soft drinks and other drinks sweetened with high-fructose corn syrup. Juices. To find whether a food or drink is sweetened with high-fructose corn syrup, look at the ingredients list.  Drinks high in purines: Alcoholic beverages (such as beer). Condiments  Foods low in purines: Salt, herbs, olives, pickles, relishes, vinegar.  Foods  moderate in purines: Butter, margarine, oils, mayonnaise. Fats and Oils  Foods low in purines: All types, except gravies and sauces made with meat.  Foods high in purines: Gravies and sauces made with meat. Other Foods  Foods low in purines: Sugars, sweets, gelatin. Cake. Soups made without meat.  Foods moderate in purines: Meat-based or fish-based soups, broths, or bouillons. Foods and drinks sweetened with high-fructose corn syrup.  Foods high in purines: High-fat desserts (such as ice cream, cookies, cakes, pies, doughnuts, and chocolate). Contact your dietitian for more information on foods that are not listed here.   This information is not intended to replace advice given to you by your health care provider. Make sure you discuss any questions you have with your health care provider.   Document Released: 10/20/2010 Document Revised: 06/30/2013 Document Reviewed: 06/01/2013 Elsevier Interactive Patient Education [employer]

## 2015-11-20 NOTE — ED Notes (Signed)
Discussed discharge instructions, prescriptions, and follow-up care with patient. No questions or concerns at this time. Pt stable at discharge. Pt leaving by [employer] service.

## 2015-12-01 ENCOUNTER — Emergency Department: Payer: Medicaid Other

## 2015-12-01 ENCOUNTER — Emergency Department
Admission: EM | Admit: 2015-12-01 | Discharge: 2015-12-01 | Disposition: A | Discharge status: Home / Self Care | Payer: Medicaid Other | Attending: Student | Admitting: Student

## 2015-12-01 ENCOUNTER — Encounter: Payer: Self-pay | Admitting: Medical Oncology

## 2015-12-01 DIAGNOSIS — E119 Type 2 diabetes mellitus without complications: Secondary | ICD-10-CM | POA: Insufficient documentation

## 2015-12-01 DIAGNOSIS — Z7982 Long term (current) use of aspirin: Secondary | ICD-10-CM | POA: Insufficient documentation

## 2015-12-01 DIAGNOSIS — I252 Old myocardial infarction: Secondary | ICD-10-CM | POA: Diagnosis not present

## 2015-12-01 DIAGNOSIS — Y999 Unspecified external cause status: Secondary | ICD-10-CM | POA: Diagnosis not present

## 2015-12-01 DIAGNOSIS — M069 Rheumatoid arthritis, unspecified: Secondary | ICD-10-CM | POA: Insufficient documentation

## 2015-12-01 DIAGNOSIS — E785 Hyperlipidemia, unspecified: Secondary | ICD-10-CM | POA: Insufficient documentation

## 2015-12-01 DIAGNOSIS — S161XXA Strain of muscle, fascia and tendon at neck level, initial encounter: Secondary | ICD-10-CM | POA: Insufficient documentation

## 2015-12-01 DIAGNOSIS — Y9389 Activity, other specified: Secondary | ICD-10-CM | POA: Diagnosis not present

## 2015-12-01 DIAGNOSIS — S199XXA Unspecified injury of neck, initial encounter: Secondary | ICD-10-CM | POA: Diagnosis present

## 2015-12-01 DIAGNOSIS — I2510 Atherosclerotic heart disease of native coronary artery without angina pectoris: Secondary | ICD-10-CM | POA: Insufficient documentation

## 2015-12-01 DIAGNOSIS — Z7984 Long term (current) use of oral hypoglycemic drugs: Secondary | ICD-10-CM | POA: Diagnosis not present

## 2015-12-01 DIAGNOSIS — Y92410 Unspecified street and highway as the place of occurrence of the external cause: Secondary | ICD-10-CM | POA: Insufficient documentation

## 2015-12-01 DIAGNOSIS — Z79899 Other long term (current) drug therapy: Secondary | ICD-10-CM | POA: Insufficient documentation

## 2015-12-01 DIAGNOSIS — S46911A Strain of unspecified muscle, fascia and tendon at shoulder and upper arm level, right arm, initial encounter: Secondary | ICD-10-CM | POA: Diagnosis not present

## 2015-12-01 DIAGNOSIS — I110 Hypertensive heart disease with heart failure: Secondary | ICD-10-CM | POA: Insufficient documentation

## 2015-12-01 DIAGNOSIS — Z8673 Personal history of transient ischemic attack (TIA), and cerebral infarction without residual deficits: Secondary | ICD-10-CM | POA: Diagnosis not present

## 2015-12-01 DIAGNOSIS — I509 Heart failure, unspecified: Secondary | ICD-10-CM | POA: Diagnosis not present

## 2015-12-01 MED ORDER — OXYCODONE HCL 5 MG PO TABS
5.0000 mg | ORAL_TABLET | Freq: Once | ORAL | Status: AC
  Administered 2015-12-01: 5 mg via ORAL
  Filled 2015-12-01: qty 1

## 2015-12-01 MED ORDER — TRAMADOL HCL 50 MG PO TABS: 50.0000 mg | ORAL_TABLET | Freq: Three times a day (TID) | ORAL | Status: DC | PRN
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
## 2015-12-01 NOTE — ED Notes (Signed)
Pt ambulated by himself to the bed and then walked down the hall to the bathroom with no assistance
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
## 2015-12-01 NOTE — ED Notes (Signed)
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

## 2015-12-29 ENCOUNTER — Encounter: Payer: Medicaid Other | Admitting: Pharmacist

## 2016-01-03 ENCOUNTER — Ambulatory Visit (INDEPENDENT_AMBULATORY_CARE_PROVIDER_SITE_OTHER): Payer: Medicaid Other | Admitting: Sports Medicine

## 2016-01-03 ENCOUNTER — Encounter: Payer: Self-pay | Admitting: Sports Medicine

## 2016-01-03 DIAGNOSIS — B351 Tinea unguium: Secondary | ICD-10-CM

## 2016-01-03 DIAGNOSIS — M79671 Pain in right foot: Secondary | ICD-10-CM

## 2016-01-03 DIAGNOSIS — M79676 Pain in unspecified toe(s): Secondary | ICD-10-CM

## 2016-01-03 DIAGNOSIS — E119 Type 2 diabetes mellitus without complications: Secondary | ICD-10-CM | POA: Diagnosis not present

## 2016-01-03 DIAGNOSIS — M79672 Pain in left foot: Secondary | ICD-10-CM

## 2016-01-03 NOTE — Progress Notes (Signed)
Patient ID: Derrick Barajas, male   DOB: 25-Jan-1956, 60 y.o.   MRN: [id number]  Subjective: Derrick Barajas is a 60 y.o. male patient with history of type 2 diabetes who presents to office today complaining of long, painful nails  while ambulating in shoes; unable to trim. Patient states that the glucose reading has been and high and low. Patient denies any new changes in medication or new problems. Patient denies any new cramping, numbness, burning or tingling in the legs.  Patient Active Problem List   Diagnosis Date Noted  . Controlled type 2 diabetes mellitus without complication (HCC) 10/26/2015  . BP (high blood pressure) 10/26/2015  . Myocardial infarction (HCC) 10/26/2015   Current Outpatient Prescriptions on File Prior to Visit  Medication Sig Dispense Refill  . ACCU-CHEK AVIVA PLUS test strip USE UTD TID  11  . amLODipine (NORVASC) 10 MG tablet Take 10 mg by mouth daily.    Marland Kitchen aspirin 81 MG tablet Take 81 mg by mouth daily.    . B-D ULTRAFINE III SHORT PEN 31G X 8 MM MISC USE UTD TO BID  11  . colchicine 0.6 MG tablet Take 1 tablet (0.6 mg total) by mouth daily. 10 tablet 2  . furosemide (LASIX) 20 MG tablet Take 20 mg by mouth daily.    . INVOKANA 300 MG TABS tablet Take 1 tablet by mouth daily.  0  . isosorbide mononitrate (IMDUR) 30 MG 24 hr tablet Take 30 mg by mouth 2 (two) times daily.  5  . JANUVIA 100 MG tablet Take 100 mg by mouth daily.  2  . LEVEMIR FLEXTOUCH 100 UNIT/ML Pen Inject 60 Units into the skin 2 (two) times daily.  2  . levothyroxine (SYNTHROID, LEVOTHROID) 125 MCG tablet Take 125 mcg by mouth daily.  2  . metFORMIN (GLUCOPHAGE) 1000 MG tablet Take 1,000 mg by mouth 2 (two) times daily.  2  . metoprolol succinate (TOPROL-XL) 100 MG 24 hr tablet Take 100 mg by mouth daily.  5  . nitroGLYCERIN (NITROSTAT) 0.4 MG SL tablet Place 0.4 mg under the tongue every 5 (five) minutes as needed. Reported on 10/05/2015    . oxyCODONE-acetaminophen (ROXICET) 5-325 MG tablet Take  1 tablet by mouth every 6 (six) hours as needed. 12 tablet 0  . prasugrel (EFFIENT) 10 MG TABS Take 10 mg by mouth daily.     . ramipril (ALTACE) 10 MG capsule Take 10 mg by mouth daily.  2  . RANEXA 500 MG 12 hr tablet Take 500 mg by mouth 2 (two) times daily.  5  . rosuvastatin (CRESTOR) 20 MG tablet Take 20 mg by mouth daily.  5  . TANZEUM 50 MG PEN Inject 50 mg into the skin once a week.  2  . traMADol (ULTRAM) 50 MG tablet Take 1 tablet (50 mg total) by mouth every 8 (eight) hours as needed. Do not drive while taking this medication. 12 tablet 0   No current facility-administered medications on file prior to visit.   Allergies  Allergen Reactions  . Iodine Nausea And Vomiting  . Shrimp [Shellfish Allergy] Nausea And Vomiting   Labs: HEMOGLOBIN A1C- No recent lab  Objective: General: Patient is awake, alert, and oriented x 3 and in no acute distress.  Integument: Skin is warm, dry and supple bilateral. Nails are tender, long, thickened and  dystrophic with subungual debris, consistent with onychomycosis, 1-5 bilateral. No signs of infection. No open lesions or preulcerative lesions present bilateral. Remaining  integument unremarkable.  Vasculature:  Dorsalis Pedis pulse 2/4 bilateral. Posterior Tibial pulse  2/4 bilateral.  Capillary fill time <3 sec 1-5 bilateral. Positive hair growth to the level of the digits. Temperature gradient within normal limits. No varicosities present bilateral. Trace edema present bilateral.   Neurology: The patient has intact sensation measured with a 5.07/10g Semmes Weinstein Monofilament at all pedal sites bilateral . Vibratory sensation intact bilateral with tuning fork. No Babinski sign present bilateral.   Musculoskeletal: No gross pedal deformities noted bilateral. Muscular strength 5/5 in all lower extremity muscular groups bilateral without pain or limitation on range of motion . No tenderness with calf compression bilateral.  Assessment and  Plan: Problem List Items Addressed This Visit    None    Visit Diagnoses    Dermatophytosis, nail    -  Primary    Foot pain, bilateral        Diabetes mellitus without complication (HCC)          -Examined patient. -Discussed and educated patient on diabetic foot care, especially with  regards to the vascular, neurological and musculoskeletal systems.  -Stressed the importance of good glycemic control and the detriment of not  controlling glucose levels in relation to the foot. -Mechanically debrided all nails 1-5 bilateral using sterile nail nipper and filed with dremel without incident  -Encouraged elevation of legs to assist with edema control -Cont with daily skin emollients -Answered all patient questions -Patient to return in 3 months for at risk foot care -Patient advised to call the office if any problems or questions arise in the meantime.  Derrick Barajas, DPM

## 2016-01-21 ENCOUNTER — Emergency Department: Payer: Medicaid Other

## 2016-01-21 DIAGNOSIS — I509 Heart failure, unspecified: Secondary | ICD-10-CM | POA: Diagnosis not present

## 2016-01-21 DIAGNOSIS — E785 Hyperlipidemia, unspecified: Secondary | ICD-10-CM | POA: Insufficient documentation

## 2016-01-21 DIAGNOSIS — Z7984 Long term (current) use of oral hypoglycemic drugs: Secondary | ICD-10-CM | POA: Diagnosis not present

## 2016-01-21 DIAGNOSIS — I110 Hypertensive heart disease with heart failure: Secondary | ICD-10-CM | POA: Diagnosis not present

## 2016-01-21 DIAGNOSIS — Z79899 Other long term (current) drug therapy: Secondary | ICD-10-CM | POA: Diagnosis not present

## 2016-01-21 DIAGNOSIS — M25511 Pain in right shoulder: Secondary | ICD-10-CM | POA: Diagnosis present

## 2016-01-21 DIAGNOSIS — Y999 Unspecified external cause status: Secondary | ICD-10-CM | POA: Diagnosis not present

## 2016-01-21 DIAGNOSIS — Y9355 Activity, bike riding: Secondary | ICD-10-CM | POA: Insufficient documentation

## 2016-01-21 DIAGNOSIS — I2510 Atherosclerotic heart disease of native coronary artery without angina pectoris: Secondary | ICD-10-CM | POA: Diagnosis not present

## 2016-01-21 DIAGNOSIS — S4991XA Unspecified injury of right shoulder and upper arm, initial encounter: Secondary | ICD-10-CM | POA: Insufficient documentation

## 2016-01-21 DIAGNOSIS — Z8673 Personal history of transient ischemic attack (TIA), and cerebral infarction without residual deficits: Secondary | ICD-10-CM | POA: Insufficient documentation

## 2016-01-21 DIAGNOSIS — Z7982 Long term (current) use of aspirin: Secondary | ICD-10-CM | POA: Insufficient documentation

## 2016-01-21 DIAGNOSIS — E119 Type 2 diabetes mellitus without complications: Secondary | ICD-10-CM | POA: Diagnosis not present

## 2016-01-21 DIAGNOSIS — Y929 Unspecified place or not applicable: Secondary | ICD-10-CM | POA: Insufficient documentation

## 2016-01-21 DIAGNOSIS — I252 Old myocardial infarction: Secondary | ICD-10-CM | POA: Insufficient documentation
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
## 2016-01-22 ENCOUNTER — Encounter: Payer: Self-pay | Admitting: Emergency Medicine

## 2016-01-22 ENCOUNTER — Emergency Department: Payer: Medicaid Other

## 2016-01-22 ENCOUNTER — Emergency Department
Admission: EM | Admit: 2016-01-22 | Discharge: 2016-01-22 | Disposition: A | Discharge status: Home / Self Care | Payer: Medicaid Other | Attending: Emergency Medicine | Admitting: Emergency Medicine

## 2016-01-22 DIAGNOSIS — S4991XA Unspecified injury of right shoulder and upper arm, initial encounter: Secondary | ICD-10-CM

## 2016-01-22 MED ORDER — KETOROLAC TROMETHAMINE 10 MG PO TABS
10.0000 mg | ORAL_TABLET | Freq: Once | ORAL | Status: AC
  Administered 2016-01-22: 10 mg via ORAL
  Filled 2016-01-22: qty 1

## 2016-01-22 MED ORDER — OXYCODONE-ACETAMINOPHEN 5-325 MG PO TABS
1.0000 | ORAL_TABLET | Freq: Once | ORAL | Status: AC
  Administered 2016-01-22: 1 via ORAL
  Filled 2016-01-22: qty 1

## 2016-01-22 MED ORDER — OXYCODONE-ACETAMINOPHEN 5-325 MG PO TABS: 1.0000 | ORAL_TABLET | ORAL | Status: DC | PRN
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
## 2016-02-14 ENCOUNTER — Other Ambulatory Visit: Payer: Self-pay | Admitting: Student

## 2016-02-14 DIAGNOSIS — M7581 Other shoulder lesions, right shoulder: Secondary | ICD-10-CM

## 2016-02-28 ENCOUNTER — Ambulatory Visit
Admission: RE | Admit: 2016-02-28 | Discharge: 2016-02-28 | Disposition: A | Discharge status: Home / Self Care | Payer: Medicaid Other | Source: Ambulatory Visit | Attending: Student | Admitting: Student

## 2016-02-28 DIAGNOSIS — M7581 Other shoulder lesions, right shoulder: Secondary | ICD-10-CM

## 2016-02-28 DIAGNOSIS — M75101 Unspecified rotator cuff tear or rupture of right shoulder, not specified as traumatic: Secondary | ICD-10-CM | POA: Diagnosis not present

## 2016-03-27 DIAGNOSIS — M75121 Complete rotator cuff tear or rupture of right shoulder, not specified as traumatic: Secondary | ICD-10-CM | POA: Insufficient documentation

## 2016-05-10 ENCOUNTER — Emergency Department (HOSPITAL_COMMUNITY)
Admission: EM | Admit: 2016-05-10 | Discharge: 2016-05-10 | Disposition: A | Discharge status: Home / Self Care | Payer: Medicaid Other | Attending: Emergency Medicine | Admitting: Emergency Medicine

## 2016-05-10 ENCOUNTER — Encounter (HOSPITAL_COMMUNITY): Payer: Self-pay

## 2016-05-10 DIAGNOSIS — R531 Weakness: Secondary | ICD-10-CM

## 2016-05-10 DIAGNOSIS — Z8673 Personal history of transient ischemic attack (TIA), and cerebral infarction without residual deficits: Secondary | ICD-10-CM | POA: Diagnosis not present

## 2016-05-10 DIAGNOSIS — I110 Hypertensive heart disease with heart failure: Secondary | ICD-10-CM | POA: Diagnosis not present

## 2016-05-10 DIAGNOSIS — I252 Old myocardial infarction: Secondary | ICD-10-CM | POA: Diagnosis not present

## 2016-05-10 DIAGNOSIS — I509 Heart failure, unspecified: Secondary | ICD-10-CM | POA: Insufficient documentation

## 2016-05-10 DIAGNOSIS — Z7982 Long term (current) use of aspirin: Secondary | ICD-10-CM | POA: Insufficient documentation

## 2016-05-10 DIAGNOSIS — Z79899 Other long term (current) drug therapy: Secondary | ICD-10-CM | POA: Insufficient documentation

## 2016-05-10 DIAGNOSIS — I2510 Atherosclerotic heart disease of native coronary artery without angina pectoris: Secondary | ICD-10-CM | POA: Diagnosis not present

## 2016-05-10 DIAGNOSIS — E119 Type 2 diabetes mellitus without complications: Secondary | ICD-10-CM | POA: Diagnosis not present

## 2016-05-10 DIAGNOSIS — R5383 Other fatigue: Secondary | ICD-10-CM | POA: Diagnosis present

## 2016-05-10 DIAGNOSIS — Z7984 Long term (current) use of oral hypoglycemic drugs: Secondary | ICD-10-CM | POA: Diagnosis not present

## 2016-05-10 LAB — CBG MONITORING, ED: GLUCOSE-CAPILLARY: 158 mg/dL — AB (ref 65–99)

## 2016-05-10 MED ORDER — SODIUM CHLORIDE 0.9 % IV BOLUS (SEPSIS): 1000.0000 mL | Freq: Once | INTRAVENOUS | Status: DC

## 2016-05-10 NOTE — ED Triage Notes (Addendum)
Pt presents to the ED via EMS c/o weakness and dizziness x 3 hours. Per EMS, pt has been walking around all day after missing the bus and reports he has not eaten since 0830 this AM. Pt has hx of DM type 2 and HTN. CBG 150. BP 140/100, 110 bpm. Facial symmetry noted, sensation and mobility intact bilaterally. PERRLA. Pt eating a sandwich on arrival with EMS. NAD distress noted. Pt denies dizziness or weakness at this time. Pt states "he's just tired and fatigued."

## 2016-05-10 NOTE — Discharge Instructions (Signed)
Follow-up with your primary Dr. if you experience any new or additional symptoms.

## 2016-05-10 NOTE — ED Provider Notes (Signed)
[facility]-EMERGENCY DEPT Provider Note   CSN: [id number] Arrival date & time: 05/10/16  1801     History   Chief Complaint Chief Complaint  Patient presents with  . Fatigue    HPI Derrick Barajas is a 60 y.o. male.  Patient is a 60 year old male with past medical history of coronary artery disease, CHF, diabetes, hypertension. He was brought by EMS for evaluation of weakness and dizziness. He states he is here from [place], [place] to fill out paperwork at [employer]. He reports missing the bus back home. He was unable to have anything to eat all day and became lightheaded. EMS was called and he was transported here. He was given a sandwich and a bottle of water and he is now feeling better. He has no complaints other than that he has no ride home.   The history is provided by the patient.    Past Medical History:  Diagnosis Date  . CAD (coronary artery disease)   . CHF (congestive heart failure) (HCC)   . Diabetes mellitus without complication (HCC)   . Gout   . Hyperlipidemia   . Hypertension   . MI (myocardial infarction)   . RA (rheumatoid arthritis) (HCC)   . Sleep apnea   . Stroke [facility])    left facal droop  . Thyroid disease     Patient Active Problem List   Diagnosis Date Noted  . Controlled type 2 diabetes mellitus without complication (HCC) 10/26/2015  . BP (high blood pressure) 10/26/2015  . Myocardial infarction 10/26/2015    Past Surgical History:  Procedure Laterality Date  . CIRCUMCISION    . CORONARY ANGIOPLASTY WITH STENT PLACEMENT    . TONSILLECTOMY         Home Medications    Prior to Admission medications   Medication Sig Start Date End Date Taking? Authorizing Provider  ACCU-CHEK AVIVA PLUS test strip USE UTD TID 09/12/15   Historical Provider, MD  amLODipine (NORVASC) 10 MG tablet Take 10 mg by mouth daily.    Historical Provider, MD  aspirin 81 MG tablet Take 81 mg by mouth daily.    Historical Provider, MD  B-D ULTRAFINE  III SHORT PEN 31G X 8 MM MISC USE UTD TO BID 09/12/15   Historical Provider, MD  colchicine 0.6 MG tablet Take 1 tablet (0.6 mg total) by mouth daily. 11/20/15 11/19/16  Jeanmarie Plant, MD  furosemide (LASIX) 20 MG tablet Take 20 mg by mouth daily.    Historical Provider, MD  INVOKANA 300 MG TABS tablet Take 1 tablet by mouth daily. 10/07/15   Historical Provider, MD  isosorbide mononitrate (IMDUR) 30 MG 24 hr tablet Take 30 mg by mouth 2 (two) times daily. 08/28/15   Historical Provider, MD  JANUVIA 100 MG tablet Take 100 mg by mouth daily. 09/26/15   Historical Provider, MD  LEVEMIR FLEXTOUCH 100 UNIT/ML Pen Inject 60 Units into the skin 2 (two) times daily. 09/26/15   Historical Provider, MD  levothyroxine (SYNTHROID, LEVOTHROID) 125 MCG tablet Take 125 mcg by mouth daily. 09/26/15   Historical Provider, MD  metFORMIN (GLUCOPHAGE) 1000 MG tablet Take 1,000 mg by mouth 2 (two) times daily. 09/14/15   Historical Provider, MD  metoprolol succinate (TOPROL-XL) 100 MG 24 hr tablet Take 100 mg by mouth daily. 09/14/15   Historical Provider, MD  nitroGLYCERIN (NITROSTAT) 0.4 MG SL tablet Place 0.4 mg under the tongue every 5 (five) minutes as needed. Reported on 10/05/2015  Historical Provider, MD  oxyCODONE-acetaminophen (ROXICET) 5-325 MG tablet Take 1 tablet by mouth every 6 (six) hours as needed. 11/20/15   Jeanmarie Plant, MD  oxyCODONE-acetaminophen (ROXICET) 5-325 MG tablet Take 1 tablet by mouth every 4 (four) hours as needed for severe pain. 01/22/16   Darci Current, MD  prasugrel (EFFIENT) 10 MG TABS Take 10 mg by mouth daily.     Historical Provider, MD  ramipril (ALTACE) 10 MG capsule Take 10 mg by mouth daily. 09/14/15   Historical Provider, MD  RANEXA 500 MG 12 hr tablet Take 500 mg by mouth 2 (two) times daily. 07/19/15   Historical Provider, MD  rosuvastatin (CRESTOR) 20 MG tablet Take 20 mg by mouth daily. 09/01/15   Historical Provider, MD  TANZEUM 50 MG PEN Inject 50 mg into the skin once a week.  09/30/15   Historical Provider, MD  traMADol (ULTRAM) 50 MG tablet Take 1 tablet (50 mg total) by mouth every 8 (eight) hours as needed. Do not drive while taking this medication. 12/01/15 11/30/16  Gayla Doss, MD    Family History Family History  Problem Relation Age of Onset  . Diabetes Mother   . Diabetes Brother     Social History Social History  Substance Use Topics  . Smoking status: Never Smoker  . Smokeless tobacco: Never Used  . Alcohol use 0.0 oz/week     Comment: rarely     Allergies   Iodine and Shrimp [shellfish allergy]   Review of Systems Review of Systems  All other systems reviewed and are negative.    Physical Exam Updated Vital Signs BP 141/76   Pulse 94   Temp 97.8 F (36.6 C) (Oral)   Resp 16   Ht 5\' 7"  (1.702 m)   Wt 260 lb (117.9 kg)   SpO2 98%   BMI 40.72 kg/m   Physical Exam  Constitutional: He is oriented to person, place, and time. He appears well-developed and well-nourished. No distress.  HENT:  Head: Normocephalic and atraumatic.  Mouth/Throat: Oropharynx is clear and moist.  Neck: Normal range of motion. Neck supple.  Cardiovascular: Normal rate and regular rhythm.  Exam reveals no friction rub.   No murmur heard. Pulmonary/Chest: Effort normal and breath sounds normal. No respiratory distress. He has no wheezes. He has no rales.  Abdominal: Soft. Bowel sounds are normal. He exhibits no distension. There is no tenderness.  Musculoskeletal: Normal range of motion. He exhibits no edema.  Neurological: He is alert and oriented to person, place, and time. Coordination normal.  Skin: Skin is warm and dry. He is not diaphoretic.  Nursing note and vitals reviewed.    ED Treatments / Results  Labs (all labs ordered are listed, but only abnormal results are displayed) Labs Reviewed  CBG MONITORING, ED - Abnormal; Notable for the following:       Result Value   Glucose-Capillary 158 (*)    All other components within normal  limits    EKG  EKG Interpretation None       Radiology No results found.  Procedures Procedures (including critical care time)  Medications Ordered in ED Medications - No data to display   Initial Impression / Assessment and Plan / ED Course  I have reviewed the triage vital signs and the nursing notes.  Pertinent labs & imaging results that were available during my care of the patient were reviewed by me and considered in my medical decision making (see chart for details).  Clinical Course    Patient seems to be here mainly because he couldn't find a ride back to the town where he resides. He has since found a ride and is now feeling better. His blood sugar is okay and I believe is appropriate for discharge.  Final Clinical Impressions(s) / ED Diagnoses   Final diagnoses:  None    New Prescriptions New Prescriptions   No medications on file     Geoffery Lyons, MD 05/10/16 2009

## 2016-05-10 NOTE — ED Notes (Signed)
EDP at bedside  

## 2016-06-04 ENCOUNTER — Encounter: Payer: Self-pay | Admitting: Podiatry

## 2016-06-04 ENCOUNTER — Ambulatory Visit (INDEPENDENT_AMBULATORY_CARE_PROVIDER_SITE_OTHER): Payer: Medicaid Other | Admitting: Podiatry

## 2016-06-04 DIAGNOSIS — M79676 Pain in unspecified toe(s): Secondary | ICD-10-CM | POA: Diagnosis not present

## 2016-06-04 DIAGNOSIS — B351 Tinea unguium: Secondary | ICD-10-CM | POA: Diagnosis not present

## 2016-06-04 NOTE — Progress Notes (Signed)
Complaint:  Visit Type: Patient returns to my office for continued preventative foot care services. Complaint: Patient states" my nails have grown long and thick and become painful to walk and wear shoes" Patient has been diagnosed with DM with no foot complications. The patient presents for preventative foot care services. No changes to ROS  Podiatric Exam: Vascular: dorsalis pedis and posterior tibial pulses are palpable bilateral. Capillary return is immediate. Temperature gradient is WNL. Skin turgor WNL  Sensorium: Normal Semmes Weinstein monofilament test. Normal tactile sensation bilaterally. Nail Exam: Pt has thick disfigured discolored nails with subungual debris noted bilateral entire nail hallux through fifth toenails Ulcer Exam: There is no evidence of ulcer or pre-ulcerative changes or infection. Orthopedic Exam: Muscle tone and strength are WNL. No limitations in general ROM. No crepitus or effusions noted. Foot type and digits show no abnormalities. Bony prominences are unremarkable. Skin: No Porokeratosis. No infection or ulcers  Diagnosis:  Onychomycosis, , Pain in right toe, pain in left toes  Treatment & Plan Procedures and Treatment: Consent by patient was obtained for treatment procedures. The patient understood the discussion of treatment and procedures well. All questions were answered thoroughly reviewed. Debridement of mycotic and hypertrophic toenails, 1 through 5 bilateral and clearing of subungual debris. No ulceration, no infection noted.  Return Visit-Office Procedure: Patient instructed to return to the office for a follow up visit 3 months for continued evaluation and treatment.    Obadiah Dennard DPM 

## 2016-06-14 ENCOUNTER — Encounter: Payer: Self-pay | Admitting: Emergency Medicine

## 2016-06-14 ENCOUNTER — Emergency Department
Admission: EM | Admit: 2016-06-14 | Discharge: 2016-06-14 | Disposition: A | Discharge status: Home / Self Care | Payer: Medicaid Other | Attending: Student in an Organized Health Care Education/Training Program | Admitting: Student in an Organized Health Care Education/Training Program

## 2016-06-14 DIAGNOSIS — Z79899 Other long term (current) drug therapy: Secondary | ICD-10-CM | POA: Diagnosis not present

## 2016-06-14 DIAGNOSIS — Z7984 Long term (current) use of oral hypoglycemic drugs: Secondary | ICD-10-CM | POA: Insufficient documentation

## 2016-06-14 DIAGNOSIS — I509 Heart failure, unspecified: Secondary | ICD-10-CM | POA: Insufficient documentation

## 2016-06-14 DIAGNOSIS — R234 Changes in skin texture: Secondary | ICD-10-CM | POA: Insufficient documentation

## 2016-06-14 DIAGNOSIS — I2510 Atherosclerotic heart disease of native coronary artery without angina pectoris: Secondary | ICD-10-CM | POA: Diagnosis not present

## 2016-06-14 DIAGNOSIS — E119 Type 2 diabetes mellitus without complications: Secondary | ICD-10-CM | POA: Insufficient documentation

## 2016-06-14 DIAGNOSIS — M79672 Pain in left foot: Secondary | ICD-10-CM | POA: Diagnosis present

## 2016-06-14 DIAGNOSIS — I110 Hypertensive heart disease with heart failure: Secondary | ICD-10-CM | POA: Insufficient documentation

## 2016-06-14 MED ORDER — TRIAMCINOLONE ACETONIDE 0.5 % EX OINT: 1.0000 "application " | TOPICAL_OINTMENT | Freq: Two times a day (BID) | CUTANEOUS | 0 refills | Status: DC

## 2016-06-14 NOTE — ED Provider Notes (Signed)
[facility] Emergency Department Provider Note  ____________________________________________  Time seen: Approximately 8:42 AM  I have reviewed the triage vital signs and the nursing notes.   HISTORY  Chief Complaint Foot Pain   HPI Derrick Barajas is a 60 y.o. male who presents to the emergency department for evaluation of left heel pain. He states that the skin on the left heel has "cracked." He has had an increase in pain over the last 4 days. He has a history of diabetes and is typically well controlled. He has not taken anything or applied any lotions or creams to the cracked skin. He does see podiatry, but states that they only trim his nails.  Past Medical History:  Diagnosis Date  . CAD (coronary artery disease)   . CHF (congestive heart failure) (HCC)   . Diabetes mellitus without complication (HCC)   . Gout   . Hyperlipidemia   . Hypertension   . MI (myocardial infarction)   . RA (rheumatoid arthritis) (HCC)   . Sleep apnea   . 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 Thyroid disease     Patient Active Problem List   Diagnosis Date Noted  . Controlled type 2 diabetes mellitus without complication (HCC) 10/26/2015  . BP (high blood pressure) 10/26/2015  . Myocardial infarction 10/26/2015    Past Surgical History:  Procedure Laterality Date  . CIRCUMCISION    . CORONARY ANGIOPLASTY WITH STENT PLACEMENT    . TONSILLECTOMY      Prior to Admission medications   Medication Sig Start Date End Date Taking? Authorizing Provider  ACCU-CHEK AVIVA PLUS test strip USE UTD TID 09/12/15   Historical Provider, MD  amLODipine (NORVASC) 10 MG tablet Take 10 mg by mouth daily.    Historical Provider, MD  aspirin 81 MG tablet Take 81 mg by mouth daily.    Historical Provider, MD  B-D ULTRAFINE III SHORT PEN 31G X 8 MM MISC USE UTD TO BID 09/12/15   Historical Provider, MD  colchicine 0.6 MG tablet Take 1 tablet (0.6 mg total) by mouth daily. 11/20/15 11/19/16   Jeanmarie Plant, MD  furosemide (LASIX) 20 MG tablet Take 20 mg by mouth daily.    Historical Provider, MD  INVOKANA 300 MG TABS tablet Take 1 tablet by mouth daily. 10/07/15   Historical Provider, MD  isosorbide mononitrate (IMDUR) 30 MG 24 hr tablet Take 30 mg by mouth 2 (two) times daily. 08/28/15   Historical Provider, MD  JANUVIA 100 MG tablet Take 100 mg by mouth daily. 09/26/15   Historical Provider, MD  LEVEMIR FLEXTOUCH 100 UNIT/ML Pen Inject 60 Units into the skin 2 (two) times daily. 09/26/15   Historical Provider, MD  levothyroxine (SYNTHROID, LEVOTHROID) 125 MCG tablet Take 125 mcg by mouth daily. 09/26/15   Historical Provider, MD  metFORMIN (GLUCOPHAGE) 1000 MG tablet Take 1,000 mg by mouth 2 (two) times daily. 09/14/15   Historical Provider, MD  metoprolol succinate (TOPROL-XL) 100 MG 24 hr tablet Take 100 mg by mouth daily. 09/14/15   Historical Provider, MD  nitroGLYCERIN (NITROSTAT) 0.4 MG SL tablet Place 0.4 mg under the tongue every 5 (five) minutes as needed. Reported on 10/05/2015    Historical Provider, MD  oxyCODONE-acetaminophen (ROXICET) 5-325 MG tablet Take 1 tablet by mouth every 6 (six) hours as needed. 11/20/15   Jeanmarie Plant, MD  oxyCODONE-acetaminophen (ROXICET) 5-325 MG tablet Take 1 tablet by mouth every 4 (four) hours as needed for  severe pain. 01/22/16   Darci Current, MD  prasugrel (EFFIENT) 10 MG TABS Take 10 mg by mouth daily.     Historical Provider, MD  ramipril (ALTACE) 10 MG capsule Take 10 mg by mouth daily. 09/14/15   Historical Provider, MD  RANEXA 500 MG 12 hr tablet Take 500 mg by mouth 2 (two) times daily. 07/19/15   Historical Provider, MD  rosuvastatin (CRESTOR) 20 MG tablet Take 20 mg by mouth daily. 09/01/15   Historical Provider, MD  TANZEUM 50 MG PEN Inject 50 mg into the skin once a week. 09/30/15   Historical Provider, MD  traMADol (ULTRAM) 50 MG tablet Take 1 tablet (50 mg total) by mouth every 8 (eight) hours as needed. Do not drive while taking this  medication. 12/01/15 11/30/16  Gayla Doss, MD  triamcinolone ointment (KENALOG) 0.5 % Apply 1 application topically 2 (two) times daily. 06/14/16   Chinita Pester, FNP    Allergies Iodine and Shrimp [shellfish allergy]  Family History  Problem Relation Age of Onset  . Diabetes Mother   . Diabetes Brother     Social History Social History  Substance Use Topics  . Smoking status: Never Smoker  . Smokeless tobacco: Never Used  . Alcohol use 0.0 oz/week     Comment: rarely    Review of Systems  Constitutional: Negative for fever/chills Respiratory: Negative for shortness of breath. Musculoskeletal: Negative for pain. Skin: Positive for skin fissure and hyperkeratotic areas on bilateral feet and heels Neurological: Negative for headaches, focal weakness or numbness. ____________________________________________   PHYSICAL EXAM:  VITAL SIGNS: ED Triage Vitals  Enc Vitals Group     BP 06/14/16 0840 127/82     Pulse Rate 06/14/16 0840 75     Resp 06/14/16 0840 18     Temp 06/14/16 0840 98.1 F (36.7 C)     Temp Source 06/14/16 0840 Oral     SpO2 06/14/16 0840 95 %     Weight 06/14/16 0835 265 lb (120.2 kg)     Height 06/14/16 0835 5\' 7"  (1.702 m)     Head Circumference --      Peak Flow --      Pain Score 06/14/16 0835 6     Pain Loc --      Pain Edu? --      Excl. in GC? --      Constitutional: Alert and oriented. Well appearing and in no acute distress. Eyes: Conjunctivae are normal. EOMI. Nose: No congestion/rhinnorhea. Mouth/Throat: Mucous membranes are moist.   Neck: No stridor. Cardiovascular: Good peripheral circulation. Respiratory: Normal respiratory effort.  No retractions. Musculoskeletal: FROM throughout. Neurologic:  Normal speech and language. No gross focal neurologic deficits are appreciated. Skin:  Left heel with a very minor skin fissure through the callus of the left heel. No erythema or induration. No drainage from the site. Otherwise, the  soles of the feet are hyperkeratotic without evidence of fungal infection or cellulitis.  ____________________________________________   LABS (all labs ordered are listed, but only abnormal results are displayed)  Labs Reviewed - No data to display ____________________________________________  EKG   ____________________________________________  RADIOLOGY   ____________________________________________   PROCEDURES  Procedure(s) performed: None ____________________________________________   INITIAL IMPRESSION / ASSESSMENT AND PLAN / ED COURSE  Clinical Course     Pertinent labs & imaging results that were available during my care of the patient were reviewed by me and considered in my medical decision making (see chart for details).  Patient will be given a prescription for triamcinolone and advised to call and schedule a follow-up appointment with podiatry specifically for his current complaint. He was instructed to return to the emergency department for symptoms that change or worsen if he is unable schedule an appointment with his primary care provider or the podiatrist. ____________________________________________   FINAL CLINICAL IMPRESSION(S) / ED DIAGNOSES  Final diagnoses:  Fissure in skin of foot    Discharge Medication List as of 06/14/2016  8:54 AM    START taking these medications   Details  triamcinolone ointment (KENALOG) 0.5 % Apply 1 application topically 2 (two) times daily., Starting Thu 06/14/2016, Print        Note:  This document was prepared using Dragon voice recognition software and may include unintentional dictation errors.    Chinita Pester, FNP 06/15/16 1532    Willy Eddy, MD 06/15/16 [phone]

## 2016-06-14 NOTE — ED Notes (Signed)
Calluses and dry, flaky skin noted to heel of both feet. Small superficial crack noted to left heel with no heat, redness or drainage. Patient reports pain with ambulation.

## 2016-06-14 NOTE — Discharge Instructions (Signed)
Follow up with the podiatrist.

## 2016-06-14 NOTE — ED Triage Notes (Signed)
Patient presents to the ED stating, "My feet are cracked and one of them is hurting."  Patient reports that he first noticed any foot problems on Sunday.  Patient has history of diabetes.  Patient is in no obvious distress at this time.

## 2016-06-25 ENCOUNTER — Encounter: Payer: Self-pay | Admitting: Pharmacist

## 2016-06-25 ENCOUNTER — Encounter (INDEPENDENT_AMBULATORY_CARE_PROVIDER_SITE_OTHER): Payer: Self-pay

## 2016-06-25 ENCOUNTER — Ambulatory Visit: Payer: Medicaid Other | Admitting: Pharmacist

## 2016-06-25 VITALS — BP 122/70 | Ht 67.0 in | Wt 262.0 lb

## 2016-06-25 DIAGNOSIS — Z79899 Other long term (current) drug therapy: Secondary | ICD-10-CM

## 2016-06-25 NOTE — Progress Notes (Signed)
Medication Management Clinic Visit Note  Patient: Derrick Barajas MRN: [id number] Date of Birth: [birth year] PCP: Margaretann Loveless, MD   Derrick Barajas 60 y.o. male presents for an annual medication review visit today with the pharmacist. He is in good spirits as he is now retired from driving a taxi cab. He continues to have pain in his right shoulder due to a torn rotator cuff.   BP 122/70 (BP Location: Right Arm, Patient Position: Sitting, Cuff Size: Large)   Ht 5\' 7"  (1.702 m)   Wt 262 lb (118.8 kg)   BMI 41.04 kg/m   Patient Information   Past Medical History:  Diagnosis Date  . CAD (coronary artery disease)   . CHF (congestive heart failure) (HCC)   . Diabetes mellitus without complication (HCC)   . Gout   . Hyperlipidemia   . Hypertension   . MI (myocardial infarction)   . RA (rheumatoid arthritis) (HCC)   . Sleep apnea   . 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 Thyroid disease       Past Surgical History:  Procedure Laterality Date  . CIRCUMCISION    . CORONARY ANGIOPLASTY WITH STENT PLACEMENT    . TONSILLECTOMY       Family History  Problem Relation Age of Onset  . Diabetes Mother   . Diabetes Brother     New Diagnoses (since last visit):   Family Support: Poor  Lifestyle Diet: Breakfast: Oatmeal or grits or bacon eggs Lunch: Varies Dinner: Meat and Vegetable and tossed salad Drinks: Decaf coffee, water, diet soda, Kool-Aid (sugar free)   Current Exercise Habits: Home exercise routine, Type of exercise: Other - see comments (rides bike for transportation), Frequency (Times/Week): 7       History  Alcohol Use No      History  Smoking Status  . Never Smoker  Smokeless Tobacco  . Never Used      Health Maintenance  Topic Date Due  . Hepatitis C Screening  1955/12/14  . PNEUMOCOCCAL POLYSACCHARIDE VACCINE (1) 01/12/1958  . FOOT EXAM  01/12/1966  . OPHTHALMOLOGY EXAM  01/12/1966  . HIV Screening  01/13/1971  . TETANUS/TDAP  01/13/1975  .  COLONOSCOPY  01/12/2006  . HEMOGLOBIN A1C  02/26/2012  . ZOSTAVAX  01/13/2016  . INFLUENZA VACCINE  02/07/2016     Assessment: Compliance: Takes medications as prescribed. Uses a pill box to organize medications. Has prescription coverage, but struggling to afford co-pays at certain times of the month. Diabetes: Currently taking metformin, Invokana, Januvia, Victoza and Levemir.Checks blood glucose up to 3 times a day. Fasting BG usually less than 100 mg/dl, today's reading was 90 mg/dl. History of low BG in the am around 50 mg/dl, about 2 weeks ago. Education provided on how to treat low blood sugar. Patient could not recall A1c value. Patient is also on aspirin, ACE inhibitor and a statin. Patient sees Dr. 04/08/2016, DPM. Cardiovascular: currently on ramipril, metoprolol, isosorbide, furosemide and amlodipine. Past medical history includes CVA, MI and CHF. Blood pressure within goal of less than 140/90 mmHg. Today's blood pressure was 122/70 mmHg. Right Shoulder Pain: Currently on celecoxib for a torn rotator cuff. Cortisone shot in right shoulder at [facility]. Cholesterol: Currently taking rosuvastatin. No labs available at this visit. Gout: Using colchicine as needed for gout flares. No recent flares. Thyroid: Currently on levothyroxine. No issues noted today. Mood: Currently on duloxetine and being followed by [facility]. Patient has been set up with a  peer support team member.   Plan: Follow up:  [facility] January 2018. Medication Management Clinic in 3 months for medication review. Encouraged patient to follow up with [facility] or peer support team member, regarding refills on his duloxetine. Encouraged patient to follow up with his primary care provider in January regarding his right rotator cuff pain.  Pharmacist to contact primary care provider about using Victoza and Januvia. Patient is struggling to afford co-pays.   Derrick Barajas, PharmD Medication Management  Clinic Clinic-Pharmacy Operations Coordinator [phone]

## 2016-06-25 NOTE — Patient Instructions (Signed)
Follow up with peer support services to make an appointment with [facility].  Ask provider at [facility] if you need to see the [facility] provider again for your shoulder.

## 2016-07-05 ENCOUNTER — Encounter: Payer: Medicaid Other | Admitting: Pharmacist

## 2016-09-03 ENCOUNTER — Ambulatory Visit (INDEPENDENT_AMBULATORY_CARE_PROVIDER_SITE_OTHER): Payer: Medicaid Other | Admitting: Podiatry

## 2016-09-03 ENCOUNTER — Encounter: Payer: Self-pay | Admitting: Podiatry

## 2016-09-03 DIAGNOSIS — B351 Tinea unguium: Secondary | ICD-10-CM

## 2016-09-03 DIAGNOSIS — M79676 Pain in unspecified toe(s): Secondary | ICD-10-CM | POA: Diagnosis not present

## 2016-09-03 DIAGNOSIS — E119 Type 2 diabetes mellitus without complications: Secondary | ICD-10-CM

## 2016-09-03 NOTE — Progress Notes (Signed)
Complaint:  Visit Type: Patient returns to my office for continued preventative foot care services. Complaint: Patient states" my nails have grown long and thick and become painful to walk and wear shoes" Patient has been diagnosed with DM with no foot complications. The patient presents for preventative foot care services. No changes to ROS  Podiatric Exam: Vascular: dorsalis pedis and posterior tibial pulses are palpable bilateral. Capillary return is immediate. Temperature gradient is WNL. Skin turgor WNL  Sensorium: Normal Semmes Weinstein monofilament test. Normal tactile sensation bilaterally. Nail Exam: Pt has thick disfigured discolored nails with subungual debris noted bilateral entire nail hallux through fifth toenails Ulcer Exam: There is no evidence of ulcer or pre-ulcerative changes or infection. Orthopedic Exam: Muscle tone and strength are WNL. No limitations in general ROM. No crepitus or effusions noted. Foot type and digits show no abnormalities. Bony prominences are unremarkable. Skin: No Porokeratosis. No infection or ulcers  Diagnosis:  Onychomycosis, , Pain in right toe, pain in left toes  Treatment & Plan Procedures and Treatment: Consent by patient was obtained for treatment procedures. The patient understood the discussion of treatment and procedures well. All questions were answered thoroughly reviewed. Debridement of mycotic and hypertrophic toenails, 1 through 5 bilateral and clearing of subungual debris. No ulceration, no infection noted.  Return Visit-Office Procedure: Patient instructed to return to the office for a follow up visit 3 months for continued evaluation and treatment.    Algernon Mundie DPM 

## 2016-09-24 ENCOUNTER — Ambulatory Visit: Payer: Medicaid Other | Admitting: Pharmacist

## 2016-09-24 ENCOUNTER — Encounter: Payer: Self-pay | Admitting: Pharmacist

## 2016-09-24 VITALS — BP 138/84 | Ht 67.0 in | Wt 269.0 lb

## 2016-09-24 DIAGNOSIS — Z79899 Other long term (current) drug therapy: Secondary | ICD-10-CM

## 2016-09-24 NOTE — Patient Instructions (Addendum)
Ask Dr. Welton Flakes to check your calcium level: do I need to take a supplement? Pharmacist to check with Dr. Welton Flakes about taking Januvia with Victoza   Bone Health Bones protect organs, store calcium, and anchor muscles. Good health habits, such as eating nutritious foods and exercising regularly, are important for maintaining healthy bones. They can also help to prevent a condition that causes bones to lose density and become weak and brittle (osteoporosis). Why is bone mass important? Bone mass refers to the amount of bone tissue that you have. The higher your bone mass, the stronger your bones. An important step toward having healthy bones throughout life is to have strong and dense bones during childhood. A young adult who has a high bone mass is more likely to have a high bone mass later in life. Bone mass at its greatest it is called peak bone mass. A large decline in bone mass occurs in older adults. In women, it occurs about the time of menopause. During this time, it is important to practice good health habits, because if more bone is lost than what is replaced, the bones will become less healthy and more likely to break (fracture). If you find that you have a low bone mass, you may be able to prevent osteoporosis or further bone loss by changing your diet and lifestyle. How can I find out if my bone mass is low? Bone mass can be measured with an X-ray test that is called a bone mineral density (BMD) test. This test is recommended for all women who are age 59 or older. It may also be recommended for men who are age 20 or older, or for people who are more likely to develop osteoporosis due to:  Having bones that break easily.  Having a long-term disease that weakens bones, such as kidney disease or rheumatoid arthritis.  Having menopause earlier than normal.  Taking medicine that weakens bones, such as steroids, thyroid hormones, or hormone treatment for breast cancer or prostate  cancer.  Smoking.  Drinking three or more alcoholic drinks each day. What are the nutritional recommendations for healthy bones? To have healthy bones, you need to get enough of the right minerals and vitamins. Most nutrition experts recommend getting these nutrients from the foods that you eat. Nutritional recommendations vary from person to person. Ask your health care provider what is healthy for you. Here are some general guidelines. Calcium Recommendations  Calcium is the most important (essential) mineral for bone health. Most people can get enough calcium from their diet, but supplements may be recommended for people who are at risk for osteoporosis. Good sources of calcium include:  Dairy products, such as low-fat or nonfat milk, cheese, and yogurt.  Dark green leafy vegetables, such as bok choy and broccoli.  Calcium-fortified foods, such as orange juice, cereal, bread, soy beverages, and tofu products.  Nuts, such as almonds. Follow these recommended amounts for daily calcium intake:  Children, age 257?3: 700 mg.  Children, age 25?8: 1,000 mg.  Children, age 26?13: 1,300 mg.  Teens, age 61?18: 1,300 mg.  Adults, age 61?50: 1,000 mg.  Adults, age 83?70:  Men: 1,000 mg.  Women: 1,200 mg.  Adults, age 60 or older: 1,200 mg.  Pregnant and breastfeeding females:  Teens: 1,300 mg.  Adults: 1,000 mg. Vitamin D Recommendations  Vitamin D is the most essential vitamin for bone health. It helps the body to absorb calcium. Sunlight stimulates the skin to make vitamin D, so be sure  to get enough sunlight. If you live in a cold climate or you do not get outside often, your health care provider may recommend that you take vitamin D supplements. Good sources of vitamin D in your diet include:  Egg yolks.  Saltwater fish.  Milk and cereal fortified with vitamin D. Follow these recommended amounts for daily vitamin D intake:  Children and teens, age 33?18: 600 international  units.  Adults, age 21 or younger: 400-800 international units.  Adults, age 7 or older: 800-1,000 international units. Other Nutrients  Other nutrients for bone health include:  Phosphorus. This mineral is found in meat, poultry, dairy foods, nuts, and legumes. The recommended daily intake for adult men and adult women is 700 mg.  Magnesium. This mineral is found in seeds, nuts, dark green vegetables, and legumes. The recommended daily intake for adult men is 400?420 mg. For adult women, it is 310?320 mg.  Vitamin K. This vitamin is found in green leafy vegetables. The recommended daily intake is 120 mg for adult men and 90 mg for adult women. What type of physical activity is best for building and maintaining healthy bones? Weight-bearing and strength-building activities are important for building and maintaining peak bone mass. Weight-bearing activities cause muscles and bones to work against gravity. Strength-building activities increases muscle strength that supports bones. Weight-bearing and muscle-building activities include:  Walking and hiking.  Jogging and running.  Dancing.  Gym exercises.  Lifting weights.  Tennis and racquetball.  Climbing stairs.  Aerobics. Adults should get at least 30 minutes of moderate physical activity on most days. Children should get at least 60 minutes of moderate physical activity on most days. Ask your health care provide what type of exercise is best for you. Where can I find more information? For more information, check out the following websites:  National Osteoporosis Foundation: http://burton-owens.org/  [employer]: http://www.niams.http://www.johnson-fowler.biz/.asp This information is not intended to replace advice given to you by your health care provider. Make sure you discuss any questions you have with your health care provider. Document Released: 09/15/2003 Document Revised:  01/13/2016 Document Reviewed: 06/30/2014 Elsevier Interactive Patient Education  2017 [employer].

## 2016-09-24 NOTE — Progress Notes (Signed)
Medication Management Clinic Visit Note  Patient: Derrick Barajas MRN: [id number] Date of Birth: 11/09/1955 PCP: Margaretann Loveless, MD   [name] 61 y.o. male presents for a 3 month follow up medication review with the pharmacist today.  BP 138/84   Ht 5\' 7"  (1.702 m)   Wt 269 lb (122 kg)   BMI 42.13 kg/m   Patient Information   Past Medical History:  Diagnosis Date  . CAD (coronary artery disease)   . CHF (congestive heart failure) (HCC)   . Diabetes mellitus without complication (HCC)   . Gout   . Hyperlipidemia   . Hypertension   . MI (myocardial infarction)   . RA (rheumatoid arthritis) (HCC)   . Sleep apnea   . 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 Thyroid disease       Past Surgical History:  Procedure Laterality Date  . CIRCUMCISION    . CORONARY ANGIOPLASTY WITH STENT PLACEMENT    . TONSILLECTOMY       Family History  Problem Relation Age of Onset  . Diabetes Mother   . Diabetes Brother     New Diagnoses (since last visit): n/a  Family Support: None  Lifestyle Diet: Breakfast: sausage or bacon and eggs, oatmeal, cereal Lunch: varies (salad) Dinner: varies Drinks: water, decaf tea, Kool-Aid, lemonade      Health Maintenance/Date Completed  Last Visit to PCP: 09/2016 Next Visit to PCP: 11/2016 Specialist Visit:  Dental Exam: none recent Eye Exam: 03/2016 Prostate Exam: ? Pelvic/PAP Exam: n/a Mammogram: n/a DEXA: no Colonoscopy: 11/2016 (due) Flu Vaccine: 2017 Pneumonia Vaccine: yes       History  Alcohol Use No      History  Smoking Status  . Never Smoker  Smokeless Tobacco  . Never Used      Health Maintenance  Topic Date Due  . Hepatitis C Screening  12/30/1955  . PNEUMOCOCCAL POLYSACCHARIDE VACCINE (1) 01/12/1958  . FOOT EXAM  01/12/1966  . OPHTHALMOLOGY EXAM  01/12/1966  . HIV Screening  01/13/1971  . TETANUS/TDAP  01/13/1975  . COLONOSCOPY  01/12/2006  . HEMOGLOBIN A1C  02/26/2012  . INFLUENZA VACCINE  02/07/2016     Prior to Admission medications   Medication Sig Start Date End Date Taking? Authorizing Provider  ACCU-CHEK AVIVA PLUS test strip USE UTD TID 09/12/15  Yes Historical Provider, MD  amLODipine (NORVASC) 10 MG tablet Take 10 mg by mouth every morning.    Yes Historical Provider, MD  aspirin 81 MG tablet Take 81 mg by mouth daily.   Yes Historical Provider, MD  B-D ULTRAFINE III SHORT PEN 31G X 8 MM MISC USE UTD TO BID 09/12/15  Yes Historical Provider, MD  celecoxib (CELEBREX) 200 MG capsule Take 200 mg by mouth daily.   Yes Historical Provider, MD  colchicine 0.6 MG tablet Take 1 tablet (0.6 mg total) by mouth daily. Patient taking differently: Take 0.6 mg by mouth daily. Taking only as needed for gout flares 11/20/15 11/19/16 Yes 11/21/16, MD  docusate calcium (SURFAK) 240 MG capsule Take 240 mg by mouth at bedtime.   Yes Historical Provider, MD  DULoxetine (CYMBALTA) 60 MG capsule Take 60 mg by mouth every morning.   Yes Historical Provider, MD  furosemide (LASIX) 20 MG tablet Take 20 mg by mouth daily.   Yes Historical Provider, MD  INVOKANA 300 MG TABS tablet Take 1 tablet by mouth at bedtime.  10/07/15  Yes Historical Provider, MD  isosorbide mononitrate (IMDUR) 30 MG 24 hr tablet Take 30 mg by mouth 2 (two) times daily. 08/28/15  Yes Historical Provider, MD  JANUVIA 100 MG tablet Take 100 mg by mouth every morning.  09/26/15  Yes Historical Provider, MD  LEVEMIR FLEXTOUCH 100 UNIT/ML Pen Inject 80 Units into the skin 2 (two) times daily.  09/26/15  Yes Historical Provider, MD  levothyroxine (SYNTHROID, LEVOTHROID) 125 MCG tablet Take 125 mcg by mouth daily before breakfast.  09/26/15  Yes Historical Provider, MD  liraglutide (VICTOZA) 18 MG/3ML SOPN Inject 1.8 mg into the skin every morning.   Yes Historical Provider, MD  metFORMIN (GLUCOPHAGE) 1000 MG tablet Take 1,000 mg by mouth 2 (two) times daily. 09/14/15  Yes Historical Provider, MD  metoprolol (TOPROL-XL) 200 MG 24 hr tablet Take 200  mg by mouth every morning.   Yes Historical Provider, MD  prasugrel (EFFIENT) 10 MG TABS Take 10 mg by mouth daily.    Yes Historical Provider, MD  ramipril (ALTACE) 10 MG capsule Take 10 mg by mouth every morning.  09/14/15  Yes Historical Provider, MD  rosuvastatin (CRESTOR) 20 MG tablet Take 20 mg by mouth daily. 09/01/15  Yes Historical Provider, MD    Assessment:  Compliance: Takes medications as prescribed. Uses a pill box to organize medications. Has prescription coverage, but struggling to afford co-pays at certain times of the month. Diabetes: Currently taking metformin, Invokana, Januvia, Victoza and Levemir. Checks blood glucose up to 3 times a day. Fasting BG today was 134 mg/dl. Patient is also on aspirin, ACE inhibitor and a statin. Patient sees Dr. Stacie Acres, DPM. Currently in need of dental care. Cardiovascular: Currently on ramipril, metoprolol, isosorbide, furosemide, amlodipine and prasugrel. Past medical history includes CVA, MI and CHF. Blood pressure within goal of less than 140/90 mmHg. Today's blood pressure was 138/84 mmHg. Right Shoulder Pain: Currently on celecoxib for a torn rotator cuff. States should pain has improved, but qualified pain as 6/10 today. Cholesterol: Currently taking rosuvastatin. No labs available at this visit. Gout: Using colchicine as needed for gout flares. No recent flares. Thyroid: Currently on levothyroxine. No issues noted today. Mood: Currently on duloxetine and being followed by [facility]. Patient has been set up with a peer support team member. States mood is stable, he admits to loneliness. Health Maintenance: Patient inquired about calcium supplementation. Provided information through [facility] and encouraged patient to discuss with his provider at his next visit. Also discussed foods rich in calcium.   Plan: Pharmacist to clarify if patient should remain on both Januvia and Victoza Pharmacist to research dental care options for his insurance Patient  had not refilled his prasugrel; explained the importance of the medication Provided information on the [facility] for activities   Loucile Posner K. Joelene Millin, PharmD Medication Management [facility] Clinic-Pharmacy Operations Coordinator [phone]
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
## 2016-12-04 ENCOUNTER — Ambulatory Visit: Admission: RE | Admit: 2016-12-04 | Payer: Medicaid Other | Source: Ambulatory Visit | Admitting: Gastroenterology

## 2016-12-04 ENCOUNTER — Encounter: Admission: RE | Payer: Self-pay | Source: Ambulatory Visit

## 2016-12-04 SURGERY — COLONOSCOPY WITH PROPOFOL
Anesthesia: General

## 2016-12-04 MED ORDER — PROPOFOL 500 MG/50ML IV EMUL
INTRAVENOUS | Status: AC
  Filled 2016-12-04: qty 50

## 2016-12-09 ENCOUNTER — Encounter: Payer: Self-pay | Admitting: Emergency Medicine

## 2016-12-09 ENCOUNTER — Emergency Department
Admission: EM | Admit: 2016-12-09 | Discharge: 2016-12-09 | Disposition: A | Discharge status: Home / Self Care | Payer: Medicaid Other | Attending: Emergency Medicine | Admitting: Emergency Medicine

## 2016-12-09 ENCOUNTER — Emergency Department: Payer: Medicaid Other

## 2016-12-09 DIAGNOSIS — I252 Old myocardial infarction: Secondary | ICD-10-CM | POA: Insufficient documentation

## 2016-12-09 DIAGNOSIS — E119 Type 2 diabetes mellitus without complications: Secondary | ICD-10-CM | POA: Insufficient documentation

## 2016-12-09 DIAGNOSIS — Z794 Long term (current) use of insulin: Secondary | ICD-10-CM | POA: Insufficient documentation

## 2016-12-09 DIAGNOSIS — Z955 Presence of coronary angioplasty implant and graft: Secondary | ICD-10-CM | POA: Diagnosis not present

## 2016-12-09 DIAGNOSIS — I509 Heart failure, unspecified: Secondary | ICD-10-CM | POA: Diagnosis not present

## 2016-12-09 DIAGNOSIS — R600 Localized edema: Secondary | ICD-10-CM | POA: Insufficient documentation

## 2016-12-09 DIAGNOSIS — Z7982 Long term (current) use of aspirin: Secondary | ICD-10-CM | POA: Diagnosis not present

## 2016-12-09 DIAGNOSIS — Z8673 Personal history of transient ischemic attack (TIA), and cerebral infarction without residual deficits: Secondary | ICD-10-CM | POA: Diagnosis not present

## 2016-12-09 DIAGNOSIS — Z79899 Other long term (current) drug therapy: Secondary | ICD-10-CM | POA: Diagnosis not present

## 2016-12-09 DIAGNOSIS — M79604 Pain in right leg: Secondary | ICD-10-CM | POA: Insufficient documentation

## 2016-12-09 DIAGNOSIS — R609 Edema, unspecified: Secondary | ICD-10-CM

## 2016-12-09 DIAGNOSIS — Z7984 Long term (current) use of oral hypoglycemic drugs: Secondary | ICD-10-CM | POA: Insufficient documentation

## 2016-12-09 DIAGNOSIS — I2510 Atherosclerotic heart disease of native coronary artery without angina pectoris: Secondary | ICD-10-CM | POA: Insufficient documentation

## 2016-12-09 DIAGNOSIS — I110 Hypertensive heart disease with heart failure: Secondary | ICD-10-CM | POA: Diagnosis not present

## 2016-12-09 LAB — URINALYSIS, COMPLETE (UACMP) WITH MICROSCOPIC
BACTERIA UA: NONE SEEN
BILIRUBIN URINE: NEGATIVE
Glucose, UA: >=500 mg/dL — AB
HGB URINE DIPSTICK: NEGATIVE
KETONES UR: NEGATIVE mg/dL
LEUKOCYTES UA: NEGATIVE
NITRITE: NEGATIVE
Protein, ur: NEGATIVE mg/dL
RBC / HPF: NONE SEEN RBC/hpf (ref 0–5)
Specific Gravity, Urine: 1.017 (ref 1.005–1.030)
Squamous Epithelial / LPF: NONE SEEN
pH: 5 (ref 5.0–8.0)

## 2016-12-09 LAB — CBC WITH DIFFERENTIAL/PLATELET
BASOS PCT: 1 %
Basophils Absolute: 0.1 10*3/uL (ref 0–0.1)
EOS ABS: 0.2 10*3/uL (ref 0–0.7)
EOS PCT: 4 %
HCT: 39.4 % — ABNORMAL LOW (ref 40.0–52.0)
Hemoglobin: 13.1 g/dL (ref 13.0–18.0)
Lymphocytes Relative: 27 %
Lymphs Abs: 1.6 10*3/uL (ref 1.0–3.6)
MCH: 29 pg (ref 26.0–34.0)
MCHC: 33.2 g/dL (ref 32.0–36.0)
MCV: 87.3 fL (ref 80.0–100.0)
Monocytes Absolute: 0.7 10*3/uL (ref 0.2–1.0)
Monocytes Relative: 11 %
Neutro Abs: 3.5 10*3/uL (ref 1.4–6.5)
Neutrophils Relative %: 57 %
PLATELETS: 224 10*3/uL (ref 150–440)
RBC: 4.51 MIL/uL (ref 4.40–5.90)
RDW: 14.4 % (ref 11.5–14.5)
WBC: 6.1 10*3/uL (ref 3.8–10.6)

## 2016-12-09 LAB — COMPREHENSIVE METABOLIC PANEL
ALT: 23 U/L (ref 17–63)
ANION GAP: 8 (ref 5–15)
AST: 29 U/L (ref 15–41)
Albumin: 4 g/dL (ref 3.5–5.0)
Alkaline Phosphatase: 68 U/L (ref 38–126)
BUN: 16 mg/dL (ref 6–20)
CHLORIDE: 106 mmol/L (ref 101–111)
CO2: 27 mmol/L (ref 22–32)
Calcium: 9 mg/dL (ref 8.9–10.3)
Creatinine, Ser: 1.13 mg/dL (ref 0.61–1.24)
GFR calc non Af Amer: >60 mL/min (ref >60)
Glucose, Bld: 146 mg/dL — ABNORMAL HIGH (ref 65–99)
Potassium: 4.6 mmol/L (ref 3.5–5.1)
Sodium: 141 mmol/L (ref 135–145)
Total Bilirubin: 0.6 mg/dL (ref 0.3–1.2)
Total Protein: 6.9 g/dL (ref 6.5–8.1)

## 2016-12-09 NOTE — ED Notes (Signed)
FIRST NURSE NOTE:  C/o right leg pain from the knee down, states he first had gout but states he fell on it. States he takes lasix also.

## 2016-12-09 NOTE — Discharge Instructions (Signed)
Your lab tests, right knee x-ray, and ultrasound of the right leg were unremarkable today.

## 2016-12-09 NOTE — ED Notes (Signed)
No change in patient condition, pt resting in bed at this time, respirations even and unlabored. VSS and WNL. Will continue to monitor for further patient needs.

## 2016-12-09 NOTE — ED Provider Notes (Signed)
[facility] Emergency Department Provider Note  ____________________________________________  Time seen: Approximately 10:42 AM  I have reviewed the triage vital signs and the nursing notes.   HISTORY  Chief Complaint Leg Pain    HPI Derrick Barajas is a 61 y.o. male who complains of right leg pain around the knee for the past week after a fall at home. He's been able to walk on it but feels like his chronic swelling as little bit worse and has a little bit more pain. No chest pain or shortness of breath. The pain is moderate in intensity, worse with walking better with rest no radiation.     Past Medical History:  Diagnosis Date  . CAD (coronary artery disease)   . CHF (congestive heart failure) (HCC)   . Diabetes mellitus without complication (HCC)   . Gout   . Hyperlipidemia   . Hypertension   . MI (myocardial infarction) (HCC)   . RA (rheumatoid arthritis) (HCC)   . Sleep apnea   . 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 Thyroid disease      Patient Active Problem List   Diagnosis Date Noted  . Controlled type 2 diabetes mellitus without complication (HCC) 10/26/2015  . BP (high blood pressure) 10/26/2015  . Myocardial infarction (HCC) 10/26/2015     Past Surgical History:  Procedure Laterality Date  . CIRCUMCISION    . CORONARY ANGIOPLASTY WITH STENT PLACEMENT    . TONSILLECTOMY       Prior to Admission medications   Medication Sig Start Date End Date Taking? Authorizing Provider  ACCU-CHEK AVIVA PLUS test strip USE UTD TID 09/12/15   [provider]  Albiglutide (TANZEUM) 50 MG PEN Inject 50 mg into the skin every 7 (seven) days.    [provider]  amLODipine (NORVASC) 10 MG tablet Take 10 mg by mouth every morning.     [provider]  aspirin 81 MG tablet Take 81 mg by mouth daily.    [provider]  B-D ULTRAFINE III SHORT PEN 31G X 8 MM MISC USE UTD TO BID 09/12/15   [provider]   celecoxib (CELEBREX) 200 MG capsule Take 200 mg by mouth daily.    [provider]  colchicine 0.6 MG tablet Take 1 tablet (0.6 mg total) by mouth daily. Patient taking differently: Take 0.6 mg by mouth daily. Taking only as needed for gout flares 11/20/15 11/19/16  Jeanmarie Plant, MD  docusate calcium (SURFAK) 240 MG capsule Take 240 mg by mouth at bedtime.    [provider]  docusate sodium (COLACE) 100 MG capsule Take 100 mg by mouth daily.    [provider]  DULoxetine (CYMBALTA) 60 MG capsule Take 60 mg by mouth every morning.    [provider]  furosemide (LASIX) 20 MG tablet Take 20 mg by mouth daily.    [provider]  INVOKANA 300 MG TABS tablet Take 1 tablet by mouth at bedtime.  10/07/15   [provider]  isosorbide mononitrate (IMDUR) 30 MG 24 hr tablet Take 30 mg by mouth 2 (two) times daily. 08/28/15   [provider]  JANUVIA 100 MG tablet Take 100 mg by mouth every morning.  09/26/15   [provider]  LEVEMIR FLEXTOUCH 100 UNIT/ML Pen Inject 80 Units into the skin 2 (two) times daily.  09/26/15   [provider]  levothyroxine (SYNTHROID, LEVOTHROID) 125 MCG tablet Take 125 mcg by  mouth daily before breakfast.  09/26/15   [provider]  liraglutide (VICTOZA) 18 MG/3ML SOPN Inject 1.8 mg into the skin every morning.    [provider]  metFORMIN (GLUCOPHAGE) 1000 MG tablet Take 1,000 mg by mouth 2 (two) times daily. 09/14/15   [provider]  metoprolol (TOPROL-XL) 200 MG 24 hr tablet Take 100 mg by mouth every morning.     [provider]  metoprolol succinate (TOPROL-XL) 100 MG 24 hr tablet Take 100 mg by mouth daily. Take with or immediately following a meal.    [provider]  nitroGLYCERIN (NITROSTAT) 0.4 MG SL tablet Place 0.4 mg under the tongue every 5 (five) minutes as needed for chest pain.    [provider]  oxyCODONE-acetaminophen  (PERCOCET/ROXICET) 5-325 MG tablet Take 1 tablet by mouth every 4 (four) hours as needed for severe pain.    [provider]  prasugrel (EFFIENT) 10 MG TABS Take 10 mg by mouth daily.     [provider]  ramipril (ALTACE) 10 MG capsule Take 10 mg by mouth every morning.  09/14/15   [provider]  ranolazine (RANEXA) 500 MG 12 hr tablet Take 500 mg by mouth 2 (two) times daily.    [provider]  rosuvastatin (CRESTOR) 20 MG tablet Take 20 mg by mouth daily. 09/01/15   [provider]     Allergies Sulfa antibiotics; Iodine; Atorvastatin; Iodinated diagnostic agents; and Shrimp [shellfish allergy]   Family History  Problem Relation Age of Onset  . Diabetes Mother   . Diabetes Brother     Social History Social History  Substance Use Topics  . Smoking status: Never Smoker  . Smokeless tobacco: Never Used  . Alcohol use No    Review of Systems  Constitutional:   No fever or chills.  ENT:   No sore throat. No rhinorrhea. Cardiovascular:   No chest pain or syncope. Respiratory:   No dyspnea or cough. Gastrointestinal:   Negative for abdominal pain, vomiting and diarrhea.  Musculoskeletal:   Bilateral lower extremity swelling, chronic. Positive right knee pain after fall All other systems reviewed and are negative except as documented above in ROS and HPI.  ____________________________________________   PHYSICAL EXAM:  VITAL SIGNS: ED Triage Vitals  Enc Vitals Group     BP 12/09/16 0853 133/82     Pulse Rate 12/09/16 0853 85     Resp 12/09/16 0853 20     Temp 12/09/16 0853 97.9 F (36.6 C)     Temp Source 12/09/16 0853 Oral     SpO2 12/09/16 0853 97 %     Weight 12/09/16 0854 263 lb (119.3 kg)     Height 12/09/16 0854 5\' 7"  (1.702 m)     Head Circumference --      Peak Flow --      Pain Score 12/09/16 0853 6     Pain Loc --      Pain Edu? --      Excl. in GC? --     Vital signs reviewed, nursing assessments  reviewed.   Constitutional:   Alert and oriented. Well appearing and in no distress. Eyes:   No scleral icterus.  EOMI. No nystagmus. No conjunctival pallor. PERRL. ENT   Head:   Normocephalic and atraumatic.   Nose:   No congestion/rhinnorhea.    Mouth/Throat:   MMM, no pharyngeal erythema. No peritonsillar mass.    Neck:   No meningismus. Full ROM Hematological/Lymphatic/Immunilogical:  No cervical lymphadenopathy. Cardiovascular:   RRR. Symmetric bilateral radial and DP pulses.  No murmurs.  Respiratory:   Normal respiratory effort without tachypnea/retractions. Breath sounds are clear and equal bilaterally. No wheezes/rales/rhonchi. Gastrointestinal:   Soft and nontender. Non distended. There is no CVA tenderness.  No rebound, rigidity, or guarding. Genitourinary:   deferred Musculoskeletal:   Normal range of motion in all extremities. No joint effusions.  Tenderness over the medial aspect of the right flank. 2+ pitting edema bilaterally. calf Circumference slightly right Neurologic:   Normal speech and language.  Motor grossly intact. No gross focal neurologic deficits are appreciated.  Skin:    Skin is warm, dry and intact. No rash noted.  No petechiae, purpura, or bullae.  ____________________________________________    LABS (pertinent positives/negatives) (all labs ordered are listed, but only abnormal results are displayed) Labs Reviewed  URINALYSIS, COMPLETE (UACMP) WITH MICROSCOPIC - Abnormal; Notable for the following:       Result Value   Color, Urine STRAW (*)    APPearance CLEAR (*)    Glucose, UA >=500 (*)    All other components within normal limits  CBC WITH DIFFERENTIAL/PLATELET - Abnormal; Notable for the following:    HCT 39.4 (*)    All other components within normal limits  COMPREHENSIVE METABOLIC PANEL - Abnormal; Notable for the following:    Glucose, Bld 146 (*)    All other components within normal limits    ____________________________________________   EKG    ____________________________________________    RADIOLOGY  US Venous Img Lower Unilateral Right  Result Date: 12/09/2016 CLINICAL DATA:  Right knee pain with bilateral lower extremity edema, right greater than left. EXAM: RIGHT LOWER EXTREMITY VENOUS DOPPLER ULTRASOUND TECHNIQUE: Gray-scale sonography with graded compression, as well as color Doppler and duplex ultrasound were performed to evaluate the lower extremity deep venous systems from the level of the common femoral vein and including the common femoral, femoral, profunda femoral, popliteal and calf veins including the posterior tibial, peroneal and gastrocnemius veins when visible. The superficial great saphenous vein was also interrogated. Spectral Doppler was utilized to evaluate flow at rest and with distal augmentation maneuvers in the common femoral, femoral and popliteal veins. COMPARISON:  08/03/2006 FINDINGS: Contralateral Common Femoral Vein: Respiratory phasicity is normal and symmetric with the symptomatic side. No evidence of thrombus. Normal compressibility. Common Femoral Vein: No evidence of thrombus. Normal compressibility, respiratory phasicity and response to augmentation. Saphenofemoral Junction: No evidence of thrombus. Normal compressibility and flow on color Doppler imaging. Profunda Femoral Vein: No evidence of thrombus. Normal compressibility and flow on color Doppler imaging. Femoral Vein: No evidence of thrombus. Normal compressibility, respiratory phasicity and response to augmentation. Popliteal Vein: No evidence of thrombus. Normal compressibility, respiratory phasicity and response to augmentation. Calf Veins: No evidence of thrombus. Normal compressibility and flow on color Doppler imaging. Superficial Great Saphenous Vein: No evidence of thrombus. Normal compressibility and flow on color Doppler imaging. Other Findings:  None. IMPRESSION: No evidence of  DVT within the left lower extremity. Electronically Signed   By: Kennith Center M.D.   On: 12/09/2016 11:46   Dg Knee Complete 4 Views Right  Result Date: 12/09/2016 CLINICAL DATA:  Right knee pain.  Fall 1 week ago. EXAM: RIGHT KNEE - COMPLETE 4+ VIEW COMPARISON:  None FINDINGS: No acute bony abnormality. Specifically, no fracture, subluxation, or dislocation. Soft tissues are intact. No joint effusion. IMPRESSION: Negative. Electronically Signed   By: Charlett Nose M.D.   On: 12/09/2016 09:59  ____________________________________________   PROCEDURES Procedures  ____________________________________________   INITIAL IMPRESSION / ASSESSMENT AND PLAN / ED COURSE  Pertinent labs & imaging results that were available during my care of the patient were reviewed by me and considered in my medical decision making (see chart for details).   Clinical Course as of Dec 10 1218  Sun Dec 09, 2016  5176 RLE swelling, right knee pain. Likely contusion. Will check xr, [place].   [PS]  1010 Awaiting [place], anticipate discharge home.   [PS]    Clinical Course User Index [PS] Sharman Cheek, MD     ----------------------------------------- 12:20 PM on 12/09/2016 -----------------------------------------  Ultrasound negative. We'll discharge home to follow up with primary care  ____________________________________________   FINAL CLINICAL IMPRESSION(S) / ED DIAGNOSES  Final diagnoses:  Right leg pain  Peripheral edema      New Prescriptions   No medications on file     Portions of this note were generated with dragon dictation software. Dictation errors may occur despite best attempts at proofreading.    Sharman Cheek, MD 12/09/16 1220

## 2016-12-09 NOTE — ED Notes (Signed)
NAD noted at time of D/C. Pt taken to the lobby via wheelchair at this time. Pt denies any comments/concerns at this time.

## 2016-12-09 NOTE — ED Triage Notes (Signed)
Pt reports right lower leg pain states he started out with gout a few weeks ago, and fell last Sunday, states leg has been numb for the past 2 days, takes lasix.  Ambulatory in lobby, but limps.  Pt states "it feels like the circulation is cut off" Pitting edema present bilaterally.

## 2016-12-09 NOTE — ED Notes (Signed)
X-ray at bedside

## 2016-12-09 NOTE — ED Notes (Signed)
This RN to bedside at this time to introduce self and Vanessa Barbara to patient. Pt is noted to be resting in bed at this time with eyes closed, respirations even and unlabored, skin warm, dry, and intact. Pt awakens with mild stimuli. Repeat VS obtained, VSS and WNL. Lights dimmed for patient comfort. Pt denies any needs. Will continue to monitor for further patient needs.

## 2016-12-10 ENCOUNTER — Ambulatory Visit: Payer: Medicaid Other | Admitting: Podiatry

## 2016-12-24 ENCOUNTER — Encounter: Payer: Self-pay | Admitting: Pharmacist

## 2016-12-24 ENCOUNTER — Ambulatory Visit: Payer: Medicaid Other | Admitting: Pharmacist

## 2016-12-24 VITALS — Ht 67.0 in | Wt 281.0 lb

## 2016-12-24 DIAGNOSIS — Z79899 Other long term (current) drug therapy: Secondary | ICD-10-CM

## 2016-12-24 NOTE — Patient Instructions (Addendum)
Ask your doctor about restarting the Tresiba 200 units/ml; this allows you to draw up doses greater than 80 units.  Ask your doctor if you should be taking the Colchicine 0.6mg  DAILY or as needed for gout.  Ask your doctor if you are supposed to be taking Ranexa 500mg  twice daily.  Ask your doctor if you can stop the Januvia since you are on Victoza. They work similarly.   Ask your doctor if you should be on BOTH metoprolol ER 100mg  and Metoprolol ER 200mg .    **Recommend taking furosemide in the morning **Call [facility] for refills

## 2016-12-24 NOTE — Progress Notes (Signed)
[facility] Visit Note  Patient: Derrick Barajas MRN: [id number] Date of Birth: 1956/03/31 PCP: Margaretann Loveless, MD   Derrick Barajas 61 y.o. male presents for a 6 month medication therapy management visit today. He has insurance but is struggling to afford the copays at the end of the month.  Ht 5\' 7"  (1.702 m)   Wt 281 lb (127.5 kg)   BMI 44.01 kg/m   Patient Information   Past Medical History:  Diagnosis Date  . CAD (coronary artery disease)   . CHF (congestive heart failure) (HCC)   . Diabetes mellitus without complication (HCC)   . Gout   . Hyperlipidemia   . Hypertension   . MI (myocardial infarction) (HCC)   . RA (rheumatoid arthritis) (HCC)   . Sleep apnea   . 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 Thyroid disease       Past Surgical History:  Procedure Laterality Date  . CIRCUMCISION    . CORONARY ANGIOPLASTY WITH STENT PLACEMENT    . TONSILLECTOMY       Family History  Problem Relation Age of Onset  . Diabetes Mother   . Diabetes Brother    Outpatient Encounter Prescriptions as of 12/24/2016  Medication Sig  . ACCU-CHEK AVIVA PLUS test strip USE UTD TID  . amLODipine (NORVASC) 10 MG tablet Take 10 mg by mouth every morning.   12/26/2016 aspirin 81 MG tablet Take 81 mg by mouth daily.  . B-D ULTRAFINE III SHORT PEN 31G X 8 MM MISC USE UTD TO BID  . celecoxib (CELEBREX) 200 MG capsule Take 200 mg by mouth daily.  . colchicine 0.6 MG tablet Take 0.6 mg by mouth as needed.  . docusate calcium (SURFAK) 240 MG capsule Take 240 mg by mouth at bedtime.  . furosemide (LASIX) 20 MG tablet Take 20 mg by mouth daily.  . INVOKANA 300 MG TABS tablet Take 1 tablet by mouth at bedtime.   . isosorbide mononitrate (IMDUR) 30 MG 24 hr tablet Take 30 mg by mouth 2 (two) times daily.  Marland Kitchen JANUVIA 100 MG tablet Take 100 mg by mouth every morning.   Marland Kitchen LEVEMIR FLEXTOUCH 100 UNIT/ML Pen Inject 80 Units into the skin 2 (two) times daily.   Marland Kitchen levothyroxine (SYNTHROID,  LEVOTHROID) 125 MCG tablet Take 125 mcg by mouth daily before breakfast.   . liraglutide (VICTOZA) 18 MG/3ML SOPN Inject 1.8 mg into the skin every morning.  . metFORMIN (GLUCOPHAGE) 1000 MG tablet Take 1,000 mg by mouth 2 (two) times daily.  . metoprolol (TOPROL-XL) 200 MG 24 hr tablet Take 200 mg by mouth daily.  . metoprolol succinate (TOPROL-XL) 100 MG 24 hr tablet Take 100 mg by mouth daily. Take with or immediately following a meal.  . prasugrel (EFFIENT) 10 MG TABS Take 10 mg by mouth daily.   . ramipril (ALTACE) 10 MG capsule Take 10 mg by mouth every morning.   . rosuvastatin (CRESTOR) 20 MG tablet Take 20 mg by mouth daily.  . ranolazine (RANEXA) 500 MG 12 hr tablet Take 500 mg by mouth 2 (two) times daily.  . [DISCONTINUED] Albiglutide (TANZEUM) 50 MG PEN Inject 50 mg into the skin every 7 (seven) days.  . [DISCONTINUED] colchicine 0.6 MG tablet Take 1 tablet (0.6 mg total) by mouth daily. (Patient taking differently: Take 0.6 mg by mouth daily. Taking only as needed for gout flares)  . [DISCONTINUED] docusate sodium (COLACE) 100 MG capsule Take 100 mg  by mouth daily.  . [DISCONTINUED] DULoxetine (CYMBALTA) 60 MG capsule Take 60 mg by mouth every morning.  . [DISCONTINUED] metoprolol (TOPROL-XL) 200 MG 24 hr tablet Take 100 mg by mouth every morning.   . [DISCONTINUED] nitroGLYCERIN (NITROSTAT) 0.4 MG SL tablet Place 0.4 mg under the tongue every 5 (five) minutes as needed for chest pain.  . [DISCONTINUED] oxyCODONE-acetaminophen (PERCOCET/ROXICET) 5-325 MG tablet Take 1 tablet by mouth every 4 (four) hours as needed for severe pain.   No facility-administered encounter medications on file as of 12/24/2016.    New Diagnoses (since last visit): no  Family Support: Good    Current Exercise Habits: Home exercise routine, Type of exercise: Other - see comments (biking), Time (Minutes): 60, Frequency (Times/Week): 7, Weekly Exercise (Minutes/Week): 420, Intensity: Moderate  Exercise  limited by: None identified    History  Alcohol Use No      History  Smoking Status  . Never Smoker  Smokeless Tobacco  . Never Used      Health Maintenance  Topic Date Due  . Hepatitis C Screening  1955/07/24  . PNEUMOCOCCAL POLYSACCHARIDE VACCINE (1) 01/12/1958  . FOOT EXAM  01/12/1966  . OPHTHALMOLOGY EXAM  01/12/1966  . HIV Screening  01/13/1971  . TETANUS/TDAP  01/13/1975  . COLONOSCOPY  01/12/2006  . HEMOGLOBIN A1C  02/26/2012  . INFLUENZA VACCINE  02/06/2017   Health Maintenance/Date Completed  Last Visit to PCP: within 3 months Next Visit to PCP:  Specialist Visit: Dr. Welton Flakes for heart 12-25-16 Dental Exam:  Eye Exam: 9/18 Prostate Exam: none Pelvic/PAP Exam: n/a Mammogram: n/a DEXA:  Colonoscopy: Rescheduled for July 2018 Flu Vaccine: 2017 Pneumonia Vaccine: yes  Assessment and Plan: Diabetes:  Checking BS 3 times daily. Today BS was in the 80's. Lowest BS 73 mg/dl. Using 80 units twice to 3 times daily depending on BS readings. BS readings have been up to 297 mg/dl. Using Levemir and Victoza. No short-acting insulin. Patient states he tried [place] and felt this worked better. Currently on Levemir, Victoza, Invokana, Metformin and Januvia. Also on an ACE inhibitor, aspirin and a statin. No labs available.  Compliance/Pill Box:  Patient filled his pill box during the appointment. He is fairly familiar with the majority of his medications and what they are used for. He was not able to fill the entire week as he is out of medications and not able to refill these until he receives his check. Patent was placing the furosemide and metformin in the bedtime section and forgetting to take these.   Arthritis Pain: Currently taking Celebrex daily. Also on prasugrel and aspirin. May need to consider alternate therapy and continue to closely monitor for s/sx of bleeding.   Outcomes: Sent letter to Dr. Welton Flakes with recommendations/clarifications as follows: Consider  discontinuing Januvia since patient is on Victoza. Please clarify if patient should be on Tresiba. Should patient be on daily colchicine for prophylaxis or just as needed? Please clarify total daily dose of metoprolol succinate Should patient be on Ranexa? Patient is on prasugrel, aspirin and Celebrex; please monitor for s/sx of bleeding  Review other possible medication reductions or combinations at next MTM visit in September. (Invokamet or Synjardy)  Derrick Barajas, PharmD [facility] Clinic-Pharmacy Operations Coordinator [phone]

## 2016-12-25 ENCOUNTER — Encounter: Payer: Self-pay | Admitting: Pharmacist

## 2016-12-31 ENCOUNTER — Encounter: Payer: Medicaid Other | Admitting: Pharmacist

## 2017-01-25 ENCOUNTER — Telehealth: Payer: Self-pay | Admitting: Pharmacist

## 2017-01-25 NOTE — Telephone Encounter (Signed)
Called Mr. Caggiano back today. He wanted to update me on his insulin regimen. Dr. Welton Flakes has changed the Levemir to Tresiba- 95 units daily. He continues to take metformin, Invokana, Januvia and Victoza. They did not discuss taking him off of the Januvia.  Patient has a follow up appointment 04/01/17 with [facility].  Jaimes Eckert K. Joelene Millin, PharmD [facility]-Pharm[facility] Operations Coordinator [phone]

## 2017-02-04 ENCOUNTER — Observation Stay
Admission: EM | Admit: 2017-02-04 | Discharge: 2017-02-05 | Disposition: A | Discharge status: Home / Self Care | Payer: Medicaid Other | Attending: Internal Medicine | Admitting: Internal Medicine

## 2017-02-04 ENCOUNTER — Encounter: Payer: Self-pay | Admitting: Emergency Medicine

## 2017-02-04 ENCOUNTER — Emergency Department: Payer: Medicaid Other

## 2017-02-04 DIAGNOSIS — G4730 Sleep apnea, unspecified: Secondary | ICD-10-CM | POA: Diagnosis not present

## 2017-02-04 DIAGNOSIS — E119 Type 2 diabetes mellitus without complications: Secondary | ICD-10-CM | POA: Insufficient documentation

## 2017-02-04 DIAGNOSIS — M6281 Muscle weakness (generalized): Secondary | ICD-10-CM | POA: Diagnosis not present

## 2017-02-04 DIAGNOSIS — M109 Gout, unspecified: Secondary | ICD-10-CM | POA: Insufficient documentation

## 2017-02-04 DIAGNOSIS — I639 Cerebral infarction, unspecified: Secondary | ICD-10-CM

## 2017-02-04 DIAGNOSIS — R079 Chest pain, unspecified: Secondary | ICD-10-CM | POA: Insufficient documentation

## 2017-02-04 DIAGNOSIS — E785 Hyperlipidemia, unspecified: Secondary | ICD-10-CM | POA: Diagnosis not present

## 2017-02-04 DIAGNOSIS — Z91013 Allergy to seafood: Secondary | ICD-10-CM | POA: Diagnosis not present

## 2017-02-04 DIAGNOSIS — Z794 Long term (current) use of insulin: Secondary | ICD-10-CM | POA: Diagnosis not present

## 2017-02-04 DIAGNOSIS — R4781 Slurred speech: Secondary | ICD-10-CM | POA: Insufficient documentation

## 2017-02-04 DIAGNOSIS — I2510 Atherosclerotic heart disease of native coronary artery without angina pectoris: Secondary | ICD-10-CM | POA: Diagnosis not present

## 2017-02-04 DIAGNOSIS — Z7982 Long term (current) use of aspirin: Secondary | ICD-10-CM | POA: Diagnosis not present

## 2017-02-04 DIAGNOSIS — Z79899 Other long term (current) drug therapy: Secondary | ICD-10-CM | POA: Insufficient documentation

## 2017-02-04 DIAGNOSIS — I110 Hypertensive heart disease with heart failure: Secondary | ICD-10-CM | POA: Diagnosis not present

## 2017-02-04 DIAGNOSIS — Z882 Allergy status to sulfonamides status: Secondary | ICD-10-CM | POA: Insufficient documentation

## 2017-02-04 DIAGNOSIS — Z9119 Patient's noncompliance with other medical treatment and regimen: Secondary | ICD-10-CM | POA: Diagnosis not present

## 2017-02-04 DIAGNOSIS — R29810 Facial weakness: Principal | ICD-10-CM | POA: Insufficient documentation

## 2017-02-04 DIAGNOSIS — Z955 Presence of coronary angioplasty implant and graft: Secondary | ICD-10-CM | POA: Insufficient documentation

## 2017-02-04 DIAGNOSIS — I5032 Chronic diastolic (congestive) heart failure: Secondary | ICD-10-CM | POA: Diagnosis not present

## 2017-02-04 DIAGNOSIS — E039 Hypothyroidism, unspecified: Secondary | ICD-10-CM | POA: Diagnosis not present

## 2017-02-04 DIAGNOSIS — Z8673 Personal history of transient ischemic attack (TIA), and cerebral infarction without residual deficits: Secondary | ICD-10-CM | POA: Diagnosis not present

## 2017-02-04 DIAGNOSIS — Z9114 Patient's other noncompliance with medication regimen: Secondary | ICD-10-CM | POA: Insufficient documentation

## 2017-02-04 DIAGNOSIS — M069 Rheumatoid arthritis, unspecified: Secondary | ICD-10-CM | POA: Insufficient documentation

## 2017-02-04 DIAGNOSIS — I252 Old myocardial infarction: Secondary | ICD-10-CM | POA: Diagnosis not present

## 2017-02-04 LAB — COMPREHENSIVE METABOLIC PANEL
ALK PHOS: 62 U/L (ref 38–126)
ALT: 16 U/L — ABNORMAL LOW (ref 17–63)
ANION GAP: 9 (ref 5–15)
AST: 25 U/L (ref 15–41)
Albumin: 3.7 g/dL (ref 3.5–5.0)
BILIRUBIN TOTAL: 0.4 mg/dL (ref 0.3–1.2)
BUN: 18 mg/dL (ref 6–20)
CALCIUM: 9.4 mg/dL (ref 8.9–10.3)
CO2: 26 mmol/L (ref 22–32)
Chloride: 105 mmol/L (ref 101–111)
Creatinine, Ser: 1.2 mg/dL (ref 0.61–1.24)
GFR calc non Af Amer: >60 mL/min (ref >60)
Glucose, Bld: 137 mg/dL — ABNORMAL HIGH (ref 65–99)
POTASSIUM: 4.1 mmol/L (ref 3.5–5.1)
SODIUM: 140 mmol/L (ref 135–145)
TOTAL PROTEIN: 6.6 g/dL (ref 6.5–8.1)

## 2017-02-04 LAB — APTT: aPTT: 27 seconds (ref 24–36)

## 2017-02-04 LAB — CBC
HCT: 42.4 % (ref 40.0–52.0)
Hemoglobin: 14 g/dL (ref 13.0–18.0)
MCH: 29 pg (ref 26.0–34.0)
MCHC: 32.9 g/dL (ref 32.0–36.0)
MCV: 87.9 fL (ref 80.0–100.0)
PLATELETS: 203 10*3/uL (ref 150–440)
RBC: 4.82 MIL/uL (ref 4.40–5.90)
RDW: 14.5 % (ref 11.5–14.5)
WBC: 5.6 10*3/uL (ref 3.8–10.6)

## 2017-02-04 LAB — DIFFERENTIAL
Basophils Absolute: 0 10*3/uL (ref 0–0.1)
Basophils Relative: 1 %
EOS ABS: 0.2 10*3/uL (ref 0–0.7)
EOS PCT: 3 %
LYMPHS PCT: 25 %
Lymphs Abs: 1.4 10*3/uL (ref 1.0–3.6)
MONO ABS: 0.5 10*3/uL (ref 0.2–1.0)
Monocytes Relative: 9 %
Neutro Abs: 3.5 10*3/uL (ref 1.4–6.5)
Neutrophils Relative %: 62 %

## 2017-02-04 LAB — PROTIME-INR
INR: 0.95
PROTHROMBIN TIME: 12.7 s (ref 11.4–15.2)

## 2017-02-04 LAB — GLUCOSE, CAPILLARY
GLUCOSE-CAPILLARY: 147 mg/dL — AB (ref 65–99)
Glucose-Capillary: 105 mg/dL — ABNORMAL HIGH (ref 65–99)

## 2017-02-04 LAB — TROPONIN I
Troponin I: <0.03 ng/mL (ref <0.03)
Troponin I: <0.03 ng/mL (ref <0.03)

## 2017-02-04 MED ORDER — RAMIPRIL 10 MG PO CAPS
10.0000 mg | ORAL_CAPSULE | ORAL | Status: DC
  Administered 2017-02-05: 09:00:00 10 mg via ORAL
  Filled 2017-02-04: qty 1

## 2017-02-04 MED ORDER — METOPROLOL SUCCINATE ER 50 MG PO TB24: 100.0000 mg | ORAL_TABLET | Freq: Every day | ORAL | Status: DC

## 2017-02-04 MED ORDER — ACETAMINOPHEN 160 MG/5ML PO SOLN
650.0000 mg | ORAL | Status: DC | PRN
  Filled 2017-02-04: qty 20.3

## 2017-02-04 MED ORDER — INSULIN DETEMIR 100 UNIT/ML ~~LOC~~ SOLN
35.0000 [IU] | Freq: Every day | SUBCUTANEOUS | Status: DC
  Administered 2017-02-04: 35 [IU] via SUBCUTANEOUS
  Filled 2017-02-04 (×2): qty 0.35

## 2017-02-04 MED ORDER — FUROSEMIDE 20 MG PO TABS
20.0000 mg | ORAL_TABLET | Freq: Every day | ORAL | Status: DC
  Administered 2017-02-05: 09:00:00 20 mg via ORAL
  Filled 2017-02-04: qty 1

## 2017-02-04 MED ORDER — LORATADINE 10 MG PO TABS
10.0000 mg | ORAL_TABLET | Freq: Every day | ORAL | Status: DC
  Administered 2017-02-05: 10 mg via ORAL
  Filled 2017-02-04: qty 1

## 2017-02-04 MED ORDER — ACETAMINOPHEN 325 MG PO TABS: 650.0000 mg | ORAL_TABLET | ORAL | Status: DC | PRN

## 2017-02-04 MED ORDER — STROKE: EARLY STAGES OF RECOVERY BOOK
Freq: Once | Status: AC
  Administered 2017-02-05: 06:00:00

## 2017-02-04 MED ORDER — RANOLAZINE ER 500 MG PO TB12
500.0000 mg | ORAL_TABLET | Freq: Two times a day (BID) | ORAL | Status: DC
  Administered 2017-02-04 – 2017-02-05 (×2): 500 mg via ORAL
  Filled 2017-02-04 (×3): qty 1

## 2017-02-04 MED ORDER — ACETAMINOPHEN 650 MG RE SUPP: 650.0000 mg | RECTAL | Status: DC | PRN

## 2017-02-04 MED ORDER — PRASUGREL HCL 10 MG PO TABS
10.0000 mg | ORAL_TABLET | Freq: Every day | ORAL | Status: DC
  Administered 2017-02-05: 09:00:00 10 mg via ORAL
  Filled 2017-02-04: qty 1

## 2017-02-04 MED ORDER — DOCUSATE SODIUM 100 MG PO CAPS
100.0000 mg | ORAL_CAPSULE | Freq: Every day | ORAL | Status: DC
  Administered 2017-02-05: 09:00:00 100 mg via ORAL
  Filled 2017-02-04: qty 1

## 2017-02-04 MED ORDER — METOPROLOL SUCCINATE ER 50 MG PO TB24
200.0000 mg | ORAL_TABLET | Freq: Every day | ORAL | Status: DC
  Administered 2017-02-05: 09:00:00 200 mg via ORAL
  Filled 2017-02-04 (×2): qty 4

## 2017-02-04 MED ORDER — INSULIN ASPART 100 UNIT/ML ~~LOC~~ SOLN
0.0000 [IU] | Freq: Three times a day (TID) | SUBCUTANEOUS | Status: DC
  Administered 2017-02-05: 12:00:00 1 [IU] via SUBCUTANEOUS
  Filled 2017-02-04: qty 1

## 2017-02-04 MED ORDER — ISOSORBIDE MONONITRATE ER 30 MG PO TB24
30.0000 mg | ORAL_TABLET | Freq: Two times a day (BID) | ORAL | Status: DC
  Administered 2017-02-04 – 2017-02-05 (×2): 30 mg via ORAL
  Filled 2017-02-04 (×2): qty 1

## 2017-02-04 MED ORDER — ASPIRIN EC 81 MG PO TBEC
81.0000 mg | DELAYED_RELEASE_TABLET | Freq: Every day | ORAL | Status: DC
  Administered 2017-02-05: 09:00:00 81 mg via ORAL
  Filled 2017-02-04: qty 1

## 2017-02-04 MED ORDER — ENOXAPARIN SODIUM 40 MG/0.4ML ~~LOC~~ SOLN
40.0000 mg | Freq: Two times a day (BID) | SUBCUTANEOUS | Status: DC
  Administered 2017-02-04 – 2017-02-05 (×2): 40 mg via SUBCUTANEOUS
  Filled 2017-02-04 (×2): qty 0.4

## 2017-02-04 MED ORDER — LEVOTHYROXINE SODIUM 25 MCG PO TABS: 125.0000 ug | ORAL_TABLET | Freq: Every day | ORAL | Status: DC

## 2017-02-04 MED ORDER — CELECOXIB 200 MG PO CAPS
200.0000 mg | ORAL_CAPSULE | Freq: Every day | ORAL | Status: DC
  Administered 2017-02-05: 200 mg via ORAL
  Filled 2017-02-04: qty 1

## 2017-02-04 MED ORDER — AMLODIPINE BESYLATE 10 MG PO TABS
10.0000 mg | ORAL_TABLET | ORAL | Status: DC
  Administered 2017-02-05: 10 mg via ORAL
  Filled 2017-02-04: qty 1

## 2017-02-04 MED ORDER — ROSUVASTATIN CALCIUM 20 MG PO TABS
20.0000 mg | ORAL_TABLET | Freq: Every day | ORAL | Status: DC
  Administered 2017-02-05: 09:00:00 20 mg via ORAL
  Filled 2017-02-04: qty 1

## 2017-02-04 NOTE — ED Provider Notes (Signed)
[facility] Emergency Department Provider Note    First MD Initiated Contact with Patient 02/04/17 1215     (approximate)  I have reviewed the triage vital signs and the nursing notes.   HISTORY  Chief Complaint Chest Pain    HPI Derrick Barajas is a 61 y.o. 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  The patient was biking to the [place] [facility] roughly 4 miles. Denies any nausea or vomiting. While in triage the patient was developing slurred speech so he was made a code stroke. Patient denies any active chest pain at this time. Does have a history of heart attack with CAD. Has been reportedly noncompliant with his medications for the past 2 weeks.  Cath report from 1990:  DIAGNOSTIC SUMMARY  Coronary Artery Disease                    RCA system:  normal      Left Main:  normal                     No significant CAD indicated      LAD system:  normal                Left Ventriculogram      LCX system:  normal                    Ejection Fraction:   66%   Past Medical History:  Diagnosis Date  . CAD (coronary artery disease)   . CHF (congestive heart failure) (HCC)   . Diabetes mellitus without complication (HCC)   . Gout   . Hyperlipidemia   . Hypertension   . MI (myocardial infarction) (HCC)   . RA (rheumatoid arthritis) (HCC)   . Sleep apnea   . 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 Thyroid disease    Family History  Problem Relation Age of Onset  . Diabetes Mother   . Diabetes Brother    Past Surgical History:  Procedure Laterality Date  . CIRCUMCISION    . CORONARY ANGIOPLASTY WITH STENT PLACEMENT    . TONSILLECTOMY     Patient Active Problem List   Diagnosis Date Noted  . Controlled type 2 diabetes mellitus without complication (HCC) 10/26/2015  . BP (high blood pressure) 10/26/2015  . Myocardial  infarction (HCC) 10/26/2015      Prior to Admission medications   Medication Sig Start Date End Date Taking? Authorizing Provider  amLODipine (NORVASC) 10 MG tablet Take 10 mg by mouth every morning.    Yes [provider]  aspirin 81 MG tablet Take 81 mg by mouth daily.   Yes [provider]  celecoxib (CELEBREX) 200 MG capsule Take 200 mg by mouth daily.   Yes [provider]  cetirizine (ZYRTEC) 10 MG tablet Take 10 mg by mouth daily.   Yes [provider]  colchicine 0.6 MG tablet Take 0.6 mg by mouth as needed.   Yes [provider]  docusate calcium (SURFAK) 240 MG capsule Take 240 mg by mouth at bedtime.   Yes [provider]  furosemide (LASIX) 20 MG tablet Take 20 mg by mouth daily.   Yes [provider]  Insulin Degludec (TRESIBA FLEXTOUCH) 200 UNIT/ML SOPN Inject 95 Units into the skin  daily.   Yes [provider]  INVOKANA 300 MG TABS tablet Take 1 tablet by mouth at bedtime.  10/07/15  Yes [provider]  isosorbide mononitrate (IMDUR) 30 MG 24 hr tablet Take 30 mg by mouth 2 (two) times daily. 08/28/15  Yes [provider]  JANUVIA 100 MG tablet Take 100 mg by mouth every morning.  09/26/15  Yes [provider]  levothyroxine (SYNTHROID, LEVOTHROID) 125 MCG tablet Take 125 mcg by mouth daily before breakfast.  09/26/15  Yes [provider]  liraglutide (VICTOZA) 18 MG/3ML SOPN Inject 1.8 mg into the skin every morning.   Yes [provider]  metFORMIN (GLUCOPHAGE) 1000 MG tablet Take 1,000 mg by mouth 2 (two) times daily. 09/14/15  Yes [provider]  metoprolol (TOPROL-XL) 200 MG 24 hr tablet Take 200 mg by mouth daily.   Yes [provider]  metoprolol succinate (TOPROL-XL) 100 MG 24 hr tablet Take 100 mg by mouth daily. Take with or immediately following a meal.   Yes [provider]  ramipril (ALTACE) 10 MG capsule Take 10 mg by mouth  every morning.  09/14/15  Yes [provider]  ranolazine (RANEXA) 500 MG 12 hr tablet Take 500 mg by mouth 2 (two) times daily.   Yes [provider]  insulin detemir (LEVEMIR) 100 UNIT/ML injection Inject 85-90 Units into the skin 2 (two) times daily. Take 90 units in the morning and 85 units in the evening before meals.    [provider]  prasugrel (EFFIENT) 10 MG TABS Take 10 mg by mouth daily.     [provider]  rosuvastatin (CRESTOR) 20 MG tablet Take 20 mg by mouth daily. 09/01/15   [provider]    Allergies Sulfa antibiotics; Iodine; Atorvastatin; Iodinated diagnostic agents; and Shrimp [shellfish allergy]    Social History Social History  Substance Use Topics  . Smoking status: Never Smoker  . Smokeless tobacco: Never Used  . Alcohol use No    Review of Systems Patient denies headaches, rhinorrhea, blurry vision, numbness, shortness of breath, chest pain, edema, cough, abdominal pain, nausea, vomiting, diarrhea, dysuria, fevers, rashes or hallucinations unless otherwise stated above in HPI. ____________________________________________   PHYSICAL EXAM:  VITAL SIGNS: Vitals:   02/04/17 1300 02/04/17 1430  BP: (!) 157/103 138/85  Pulse: 94 93  Resp: 10 (!) 24  Temp:      Constitutional: Alert and oriented. in no acute distress. Eyes: Conjunctivae are normal.  Head: Atraumatic. Nose: No congestion/rhinnorhea. Mouth/Throat: Mucous membranes are moist.   Neck: No stridor. Painless ROM.  Cardiovascular: Normal rate, regular rhythm. Grossly normal heart sounds.  Good peripheral circulation. Respiratory: Normal respiratory effort.  No retractions. Lungs CTAB. Gastrointestinal: Soft and nontender. No distention. No abdominal bruits. No CVA tenderness. Musculoskeletal: No lower extremity tenderness nor edema.  No joint effusions. Neurologic: + left facial droop.  Mild slurred speech, no significant weakness or lateralizing  deficit noted. Skin:  Skin is warm, dry and intact. No rash noted. Psychiatric: Mood and affect are normal. Speech and behavior are normal.  ____________________________________________   LABS (all labs ordered are listed, but only abnormal results are displayed)  Results for orders placed or performed during the hospital encounter of 02/04/17 (from the past 24 hour(s))  Glucose, capillary     Status: Abnormal   Collection Time: 02/04/17 12:08 PM  Result Value Ref Range   Glucose-Capillary 147 (H) 65 - 99 mg/dL  Protime-INR     Status:  None   Collection Time: 02/04/17 12:23 PM  Result Value Ref Range   Prothrombin Time 12.7 11.4 - 15.2 seconds   INR 0.95   APTT     Status: None   Collection Time: 02/04/17 12:23 PM  Result Value Ref Range   aPTT 27 24 - 36 seconds  CBC     Status: None   Collection Time: 02/04/17 12:23 PM  Result Value Ref Range   WBC 5.6 3.8 - 10.6 K/uL   RBC 4.82 4.40 - 5.90 MIL/uL   Hemoglobin 14.0 13.0 - 18.0 g/dL   HCT 16.1 09.6 - 04.5 %   MCV 87.9 80.0 - 100.0 fL   MCH 29.0 26.0 - 34.0 pg   MCHC 32.9 32.0 - 36.0 g/dL   RDW 40.9 81.1 - 91.4 %   Platelets 203 150 - 440 K/uL  Differential     Status: None   Collection Time: 02/04/17 12:23 PM  Result Value Ref Range   Neutrophils Relative % 62 %   Neutro Abs 3.5 1.4 - 6.5 K/uL   Lymphocytes Relative 25 %   Lymphs Abs 1.4 1.0 - 3.6 K/uL   Monocytes Relative 9 %   Monocytes Absolute 0.5 0.2 - 1.0 K/uL   Eosinophils Relative 3 %   Eosinophils Absolute 0.2 0 - 0.7 K/uL   Basophils Relative 1 %   Basophils Absolute 0.0 0 - 0.1 K/uL  Comprehensive metabolic panel     Status: Abnormal   Collection Time: 02/04/17 12:23 PM  Result Value Ref Range   Sodium 140 135 - 145 mmol/L   Potassium 4.1 3.5 - 5.1 mmol/L   Chloride 105 101 - 111 mmol/L   CO2 26 22 - 32 mmol/L   Glucose, Bld 137 (H) 65 - 99 mg/dL   BUN 18 6 - 20 mg/dL   Creatinine, Ser 7.82 0.61 - 1.24 mg/dL   Calcium 9.4 8.9 - 95.6 mg/dL   Total  Protein 6.6 6.5 - 8.1 g/dL   Albumin 3.7 3.5 - 5.0 g/dL   AST 25 15 - 41 U/L   ALT 16 (L) 17 - 63 U/L   Alkaline Phosphatase 62 38 - 126 U/L   Total Bilirubin 0.4 0.3 - 1.2 mg/dL   GFR calc non Af Amer >60 >60 mL/min   GFR calc Af Amer >60 >60 mL/min   Anion gap 9 5 - 15  Troponin I     Status: None   Collection Time: 02/04/17 12:23 PM  Result Value Ref Range   Troponin I <0.03 <0.03 ng/mL   ____________________________________________  EKG My review and personal interpretation at Time: 11:38   Indication: chest pain  Rate: 90  Rhythm: sinus Axis: left Other: poor r wave progression, normal intervals, ____________________________________________  RADIOLOGY  I personally reviewed all radiographic images ordered to evaluate for the above acute complaints and reviewed radiology reports and findings.  These findings were personally discussed with the patient.  Please see medical record for radiology report.  ____________________________________________   PROCEDURES  Procedure(s) performed:  Procedures    Critical Care performed: no ____________________________________________   INITIAL IMPRESSION / ASSESSMENT AND PLAN / ED COURSE  Pertinent labs & imaging results that were available during my care of the patient were reviewed by me and considered in my medical decision making (see chart for details).  DDX: cva, acs, electrolye abn, hypoglycemia, uti, pna, chf  SHMUEL GIRGIS is a 61 y.o. who presents to the ED with symptoms as described  above. Patient medically and stroke out of triage. CT imaging shows no evidence of bleed. Dr. Thad Ranger of neurology at bedside. Patient does have an ace stroke score of 5 [place] to do to weakness daily from right rotator cuff injury. Do not feel that patient's symptoms are consistent with stroke requiring TPA. MRI ordered to better characterize and evaluate for CVA. EKG shows nonspecific changes but no evidence of STEMI. Initial troponin is  negative. Patient does have history of cardiac stents been noncompliant with his pain medication. Initial troponin is negative but based on his high risk features with chest pain do feel patient will require admission for further risk stratification.  Have discussed with the patient and available family all diagnostics and treatments performed thus far and all questions were answered to the best of my ability. The patient demonstrates understanding and agreement with plan.       ____________________________________________   FINAL CLINICAL IMPRESSION(S) / ED DIAGNOSES  Final diagnoses:  Chest pain, unspecified type  Slurred speech      NEW MEDICATIONS STARTED DURING THIS VISIT:  New Prescriptions   No medications on file     Note:  This document was prepared using Dragon voice recognition software and may include unintentional dictation errors.    Willy Eddy, MD 02/04/17 [phone]

## 2017-02-04 NOTE — ED Notes (Signed)
Pt's family remains at bedside. Pt waiting to go to MRI at this time.

## 2017-02-04 NOTE — ED Notes (Signed)
Pt returned from MRI, pt placedback on monitor at this time. No change in patient condition, family remains at bedside at this time. Explained will come back and do a stroke swallow screen. Pt states understanding. Will continue to monitor for further patient needs.

## 2017-02-04 NOTE — ED Notes (Signed)
This RN to bedside, explained to patient that he had a ready bed, this RN apologized for delay due to critical patient. Pt and his family state understanding at this time.

## 2017-02-04 NOTE — ED Notes (Signed)
Dr. Thad Ranger to bedside at this time.

## 2017-02-04 NOTE — Consult Note (Signed)
Referring Physician: Roxan Hockey    Chief Complaint: Chest pain, difficulty with speech  HPI: Derrick Barajas is an 61 y.o. male with a history of multiple medical problems who reports that he awakened at baseline today.  [place]  When he got there started to feel poorly and developed chest pain.  When he arrived at the ED was noted to develop facial droop and stuttering speech.  Code stroke was called at that time.  Initial NIHSS of 5.  Patient has not been compliant with medications for the past 2 weeks due to being unable to afford them.     Date last known well: Date: 02/04/2017 Time last known well: Time: 09:00 tPA Given: No: Improving symptoms  Past Medical History:  Diagnosis Date  . CAD (coronary artery disease)   . CHF (congestive heart failure) (HCC)   . Diabetes mellitus without complication (HCC)   . Gout   . Hyperlipidemia   . Hypertension   . MI (myocardial infarction) (HCC)   . RA (rheumatoid arthritis) (HCC)   . Sleep apnea   . Stroke [facility])    left facal droop  . Thyroid disease     Past Surgical History:  Procedure Laterality Date  . CIRCUMCISION    . CORONARY ANGIOPLASTY WITH STENT PLACEMENT    . TONSILLECTOMY      Family History  Problem Relation Age of Onset  . Diabetes Mother   . Diabetes Brother    Social History:  reports that he has never smoked. He has never used smokeless tobacco. He reports that he does not drink alcohol or use drugs.  Allergies:  Allergies  Allergen Reactions  . Sulfa Antibiotics Other (See Comments)    Other reaction(s): Nausea/Vomiting/Diarrhea, Shock/Unconsciousness  . Iodine Nausea And Vomiting  . Atorvastatin Other (See Comments)  . Iodinated Diagnostic Agents   . Shrimp [Shellfish Allergy] Nausea And Vomiting    Medications: I have reviewed the patient's current medications. Prior to Admission:  Prior to Admission medications   Medication Sig Start Date End Date Taking?  Authorizing Provider  ACCU-CHEK AVIVA PLUS test strip USE UTD TID 09/12/15   [provider]  amLODipine (NORVASC) 10 MG tablet Take 10 mg by mouth every morning.     [provider]  aspirin 81 MG tablet Take 81 mg by mouth daily.    [provider]  B-D ULTRAFINE III SHORT PEN 31G X 8 MM MISC USE UTD TO BID 09/12/15   [provider]  celecoxib (CELEBREX) 200 MG capsule Take 200 mg by mouth daily.    [provider]  colchicine 0.6 MG tablet Take 0.6 mg by mouth as needed.    [provider]  docusate calcium (SURFAK) 240 MG capsule Take 240 mg by mouth at bedtime.    [provider]  furosemide (LASIX) 20 MG tablet Take 20 mg by mouth daily.    [provider]  Insulin Degludec (TRESIBA FLEXTOUCH) 200 UNIT/ML SOPN Inject 95 Units into the skin daily.    [provider]  INVOKANA 300 MG TABS tablet Take 1 tablet by mouth at bedtime.  10/07/15   [provider]  isosorbide mononitrate (IMDUR) 30 MG 24 hr tablet Take 30 mg by mouth 2 (two) times daily. 08/28/15   [provider]  JANUVIA 100 MG tablet Take 100 mg by mouth every morning.  09/26/15   [provider]  levothyroxine (SYNTHROID, LEVOTHROID) 125  MCG tablet Take 125 mcg by mouth daily before breakfast.  09/26/15   [provider]  liraglutide (VICTOZA) 18 MG/3ML SOPN Inject 1.8 mg into the skin every morning.    [provider]  metFORMIN (GLUCOPHAGE) 1000 MG tablet Take 1,000 mg by mouth 2 (two) times daily. 09/14/15   [provider]  metoprolol (TOPROL-XL) 200 MG 24 hr tablet Take 200 mg by mouth daily.    [provider]  metoprolol succinate (TOPROL-XL) 100 MG 24 hr tablet Take 100 mg by mouth daily. Take with or immediately following a meal.    [provider]  prasugrel (EFFIENT) 10 MG TABS Take 10 mg by mouth daily.     [provider]  ramipril (ALTACE) 10 MG capsule Take 10 mg  by mouth every morning.  09/14/15   [provider]  ranolazine (RANEXA) 500 MG 12 hr tablet Take 500 mg by mouth 2 (two) times daily.    [provider]  rosuvastatin (CRESTOR) 20 MG tablet Take 20 mg by mouth daily. 09/01/15   [provider]     ROS: History obtained from the patient  General ROS: negative for - chills, fatigue, fever, night sweats, weight gain or weight loss Psychological ROS: negative for - behavioral disorder, hallucinations, memory difficulties, mood swings or suicidal ideation Ophthalmic ROS: poor vision ENT ROS: negative for - epistaxis, nasal discharge, oral lesions, sore throat, tinnitus or vertigo Allergy and Immunology ROS: negative for - hives or itchy/watery eyes Hematological and Lymphatic ROS: negative for - bleeding problems, bruising or swollen lymph nodes Endocrine ROS: negative for - galactorrhea, hair pattern changes, polydipsia/polyuria or temperature intolerance Respiratory ROS: negative for - cough, hemoptysis, shortness of breath or wheezing Cardiovascular ROS: lower extremity edema Gastrointestinal ROS: negative for - abdominal pain, diarrhea, hematemesis, nausea/vomiting or stool incontinence Genito-Urinary ROS: negative for - dysuria, hematuria, incontinence or urinary frequency/urgency Musculoskeletal ROS: right shoulder pain and right upper extremity numbness Neurological ROS: as noted in HPI Dermatological ROS: negative for rash and skin lesion changes  Physical Examination: Blood pressure (!) 156/89, pulse 89, temperature 97.6 F (36.4 C), temperature source Oral, resp. rate 18, height 5\' 7"  (1.702 m), weight 127.5 kg (281 lb), SpO2 97 %.  HEENT-  Normocephalic, no lesions, without obvious abnormality.  Normal external eye and conjunctiva.  Normal TM's bilaterally.  Normal auditory canals and external ears. Normal external nose, mucus membranes and septum.  Normal pharynx. Cardiovascular- S1, S2 normal, pulses  palpable throughout   Lungs- chest clear, no wheezing, rales, normal symmetric air entry Abdomen- soft, non-tender; bowel sounds normal; no masses,  no organomegaly Extremities- LE edema Lymph-no adenopathy palpable Musculoskeletal-right shoulder pain to palpation and with movement Skin-warm and dry, no hyperpigmentation, vitiligo, or suspicious lesions  Neurological Examination   Mental Status: Alert, oriented, thought content appropriate.  Speech fluent without evidence of aphasia.  Some stuttering noted at times.  Able to follow 3 step commands without difficulty. Cranial Nerves: II: Discs flat bilaterally; Visual fields grossly normal, pupils equal, round, reactive to light and accommodation III,IV, VI: ptosis not present, extra-ocular motions intact bilaterally V,VII: left facial droop, facial light touch sensation normal bilaterally VIII: hearing normal bilaterally IX,X: gag reflex present XI: bilateral shoulder shrug XII: midline tongue extension Motor: Right : Upper extremity   3-4/5    Left:     Upper extremity   5/5  Lower extremity   5/5      Lower extremity   5/5 Tone and  bulk:normal tone throughout; no atrophy noted Sensory: Pinprick and light touch intact throughout, bilaterally Deep Tendon Reflexes: 2+ in the upper extremities and absent in the lower extremities Plantars: Right: mute   Left: mute Cerebellar: Normal finger-to-nose and normal heel-to-shin testing bilaterally Gait: needs assistance    Laboratory Studies:  Basic Metabolic Panel: No results for input(s): NA, K, CL, CO2, GLUCOSE, BUN, CREATININE, CALCIUM, MG, PHOS in the last 168 hours.  Liver Function Tests: No results for input(s): AST, ALT, ALKPHOS, BILITOT, PROT, ALBUMIN in the last 168 hours. No results for input(s): LIPASE, AMYLASE in the last 168 hours. No results for input(s): AMMONIA in the last 168 hours.  CBC: No results for input(s): WBC, NEUTROABS, HGB, HCT, MCV, PLT in the last 168  hours.  Cardiac Enzymes: No results for input(s): CKTOTAL, CKMB, CKMBINDEX, TROPONINI in the last 168 hours.  BNP: Invalid input(s): POCBNP  CBG:  Recent Labs Lab 02/04/17 1208  GLUCAP 147*    Microbiology: No results found for this or any previous visit.  Coagulation Studies: No results for input(s): LABPROT, INR in the last 72 hours.  Urinalysis: No results for input(s): COLORURINE, LABSPEC, PHURINE, GLUCOSEU, HGBUR, BILIRUBINUR, KETONESUR, PROTEINUR, UROBILINOGEN, NITRITE, LEUKOCYTESUR in the last 168 hours.  Invalid input(s): APPERANCEUR  Lipid Panel:    Component Value Date/Time   CHOL 139 08/29/2011 0351   TRIG 185 08/29/2011 0351   HDL 38 (L) 08/29/2011 0351   VLDL 37 08/29/2011 0351   LDLCALC 64 08/29/2011 0351    HgbA1C:  Lab Results  Component Value Date   HGBA1C 10.0 (H) 08/29/2011    Urine Drug Screen:  No results found for: LABOPIA, COCAINSCRNUR, LABBENZ, AMPHETMU, THCU, LABBARB  Alcohol Level: No results for input(s): ETH in the last 168 hours.  Other results: EKG: sinus rhythm at 91 bpm.  Imaging: Ct Head Code Stroke W/o Cm  Result Date: 02/04/2017 CLINICAL DATA:  Code stroke. Headache and intermittent slurred speech. EXAM: CT HEAD WITHOUT CONTRAST TECHNIQUE: Contiguous axial images were obtained from the base of the skull through the vertex without intravenous contrast. COMPARISON:  02/15/2012 FINDINGS: Brain: No evidence of acute infarction, hemorrhage, hydrocephalus, extra-axial collection or mass lesion/mass effect. Cortically based gliosis in the posterior and lateral right temporal cortex. Vascular: No hyperdense vessel or unexpected calcification. Skull: Negative Sinuses/Orbits: Negative Other: These results were called by telephone at the time of interpretation on 02/04/2017 at 12:07 pm to Dr. Willy Eddy , who verbally acknowledged these results. ASPECTS [facility] Stroke Program Early CT Score) Not scored with this history. IMPRESSION: 1.  No acute finding. 2. Right posterior temporal cortical gliosis that is likely post ischemic. Electronically Signed   By: Marnee Spring M.D.   On: 02/04/2017 12:24    Assessment: 61 y.o. male presenting with chest pain and stuttering speech.  Initial NIHSS of 5 mostly attributable to chronic complaints.  Head CT reviewed and shows no acute changes.  Prescribed ASA at home.  Unclear if patient feeling poorly due to exertion or if cerebral ischemia.  Further work up recommended.      Stroke Risk Factors - diabetes mellitus, hyperlipidemia and hypertension  Plan: 1. HgbA1c, fasting lipid panel 2. MRI of the brain without contrast. 3. Prophylactic therapy-Patient to remain on ASA and Effient.  Should receive ASA 325mg  while in the ED.   4. NPO until RN stroke swallow screen. 5. Telemetry monitoring. 6. Frequent neuro checks.  Patient still within the treatment window. 7. If MRI unremarkable, no further stroke  work up recommended.   8. Medication compliance stressed.   Case discussed with Dr. Zettie Cooley, MD Neurology [phone] 02/04/2017, 12:29 PM

## 2017-02-04 NOTE — ED Notes (Signed)
Pt noted to be resting in bed at this time. NAD noted. Pt resting in bed with eyes closed, respirations even and unlabored, skin warm, dry, and intact. Will continue to monitor for further patient needs.

## 2017-02-04 NOTE — H&P (Signed)
[employer] [facility] [place] [facility]   PATIENT NAME: Derrick Barajas    MR#:  [id number]  DATE OF BIRTH:  1956-03-01  DATE OF ADMISSION:  02/04/2017  PRIMARY CARE PHYSICIAN: Margaretann Loveless, MD   REQUESTING/REFERRING PHYSICIAN: Willy Eddy M.D.  CHIEF COMPLAINT:   Chief Complaint  Patient presents with  . Chest Pain    HISTORY OF PRESENT ILLNESS: Derrick Barajas  is a 61 y.o. male with a known history of Multiple medical problems with previous history of stent, congestive heart failure, diabetes, gout, hyperlipidemia, essential hypertension and previous history of CVA who is presenting to the hospital with multiple complaints. She presents with complaint of having acute onset of facial droop as well as starting this morning. Patient does have previous history of CVA. He was seen by neurology who recommended further evaluation including MRI of the brain. Patient states that he has not been taking his medications for the past 2 weeks. In reviewing his records he is supposed to be taking 17 medications according to the 2017 medication list. He states that he just did not have refills. Patient also complains of left-sided chest pain that also started earlier today. He denies any shortness of breath. PAST MEDICAL HISTORY:   Past Medical History:  Diagnosis Date  . CAD (coronary artery disease)   . CHF (congestive heart failure) (HCC)   . Diabetes mellitus without complication (HCC)   . Gout   . Hyperlipidemia   . Hypertension   . MI (myocardial infarction) (HCC)   . RA (rheumatoid arthritis) (HCC)   . Sleep apnea   . Stroke [facility])    left facal droop  . Thyroid disease     PAST SURGICAL HISTORY: Past Surgical History:  Procedure Laterality Date  . CIRCUMCISION    . CORONARY ANGIOPLASTY WITH STENT PLACEMENT    . TONSILLECTOMY      SOCIAL HISTORY:  Social History  Substance Use Topics  . Smoking status: Never Smoker  . Smokeless tobacco: Never Used  . Alcohol use No     FAMILY HISTORY:  Family History  Problem Relation Age of Onset  . Diabetes Mother   . Diabetes Brother     DRUG ALLERGIES:  Allergies  Allergen Reactions  . Sulfa Antibiotics Other (See Comments)    Other reaction(s): Nausea/Vomiting/Diarrhea, Shock/Unconsciousness  . Iodine Nausea And Vomiting  . Atorvastatin Other (See Comments)  . Iodinated Diagnostic Agents   . Shrimp [Shellfish Allergy] Nausea And Vomiting    REVIEW OF SYSTEMS:   CONSTITUTIONAL: No fever, fatigue or weakness.  EYES: No blurred or double vision.  EARS, NOSE, AND THROAT: No tinnitus or ear pain.  RESPIRATORY: No cough, shortness of breath, wheezing or hemoptysis.  CARDIOVASCULAR: Positive chest pain, orthopnea, edema.  GASTROINTESTINAL: No nausea, vomiting, diarrhea or abdominal pain.  GENITOURINARY: No dysuria, hematuria.  ENDOCRINE: No polyuria, nocturia,  HEMATOLOGY: No anemia, easy bruising or bleeding SKIN: No rash or lesion. MUSCULOSKELETAL: No joint pain or arthritis.   NEUROLOGIC: No tingling, numbness, weakness. Slurred speech PSYCHIATRY: No anxiety or depression.   MEDICATIONS AT HOME:  Prior to Admission medications   Medication Sig Start Date End Date Taking? Authorizing Provider  amLODipine (NORVASC) 10 MG tablet Take 10 mg by mouth every morning.    Yes [provider]  aspirin 81 MG tablet Take 81 mg by mouth daily.   Yes [provider]  celecoxib (CELEBREX) 200 MG capsule Take 200 mg by mouth daily.   Yes [provider]  cetirizine (ZYRTEC) 10 MG tablet Take 10 mg by mouth daily.   Yes [provider]  colchicine 0.6 MG tablet Take 0.6 mg by mouth as needed.   Yes [provider]  docusate calcium (SURFAK) 240 MG capsule Take 240 mg by mouth at bedtime.   Yes [provider]  furosemide (LASIX) 20 MG tablet Take 20 mg by mouth daily.   Yes [provider]  Insulin Degludec (TRESIBA FLEXTOUCH) 200 UNIT/ML SOPN Inject  95 Units into the skin daily.   Yes [provider]  INVOKANA 300 MG TABS tablet Take 1 tablet by mouth at bedtime.  10/07/15  Yes [provider]  isosorbide mononitrate (IMDUR) 30 MG 24 hr tablet Take 30 mg by mouth 2 (two) times daily. 08/28/15  Yes [provider]  JANUVIA 100 MG tablet Take 100 mg by mouth every morning.  09/26/15  Yes [provider]  levothyroxine (SYNTHROID, LEVOTHROID) 125 MCG tablet Take 125 mcg by mouth daily before breakfast.  09/26/15  Yes [provider]  liraglutide (VICTOZA) 18 MG/3ML SOPN Inject 1.8 mg into the skin every morning.   Yes [provider]  metFORMIN (GLUCOPHAGE) 1000 MG tablet Take 1,000 mg by mouth 2 (two) times daily. 09/14/15  Yes [provider]  metoprolol (TOPROL-XL) 200 MG 24 hr tablet Take 200 mg by mouth daily.   Yes [provider]  metoprolol succinate (TOPROL-XL) 100 MG 24 hr tablet Take 100 mg by mouth daily. Take with or immediately following a meal.   Yes [provider]  ramipril (ALTACE) 10 MG capsule Take 10 mg by mouth every morning.  09/14/15  Yes [provider]  ranolazine (RANEXA) 500 MG 12 hr tablet Take 500 mg by mouth 2 (two) times daily.   Yes [provider]  insulin detemir (LEVEMIR) 100 UNIT/ML injection Inject 85-90 Units into the skin 2 (two) times daily. Take 90 units in the morning and 85 units in the evening before meals.    [provider]  prasugrel (EFFIENT) 10 MG TABS Take 10 mg by mouth daily.     [provider]  rosuvastatin (CRESTOR) 20 MG tablet Take 20 mg by mouth daily. 09/01/15   [provider]      PHYSICAL EXAMINATION:   VITAL SIGNS: Blood pressure 138/85, pulse 93, temperature 98 F (36.7 C), resp. rate (!) 24, height 5\' 7"  (1.702 m), weight 281 lb (127.5 kg), SpO2 97 %.  GENERAL:  61 y.o.-year-old patient lying in the bed with no acute distress.  EYES: Pupils equal, round,  reactive to light and accommodation. No scleral icterus. Extraocular muscles intact.  HEENT: Head atraumatic, normocephalic. Oropharynx and nasopharynx clear.  NECK:  Supple, no jugular venous distention. No thyroid enlargement, no tenderness.  LUNGS: Normal breath sounds bilaterally, no wheezing, rales,rhonchi or crepitation. No use of accessory muscles of respiration.  CARDIOVASCULAR: S1, S2 normal. No murmurs, rubs, or gallops.  ABDOMEN: Soft, nontender, nondistended. Bowel sounds present. No organomegaly or mass.  EXTREMITIES: 2+ pedal edema, cyanosis, or clubbing.  NEUROLOGIC: Cranial nerves II through XII are intact. Muscle strength 5/5 in all extremities. Sensation intact. Gait not checked. Slight right-sided facial droop PSYCHIATRIC: The patient is alert and oriented x 3.  SKIN: No obvious rash, lesion, or ulcer.   LABORATORY PANEL:   CBC  Recent Labs Lab 02/04/17 1223  WBC 5.6  HGB 14.0  HCT 42.4  PLT 203  MCV 87.9  MCH 29.0  MCHC 32.9  RDW 14.5  LYMPHSABS 1.4  MONOABS 0.5  EOSABS 0.2  BASOSABS 0.0   ------------------------------------------------------------------------------------------------------------------  Chemistries   Recent Labs Lab 02/04/17 1223  NA 140  K 4.1  CL 105  CO2 26  GLUCOSE 137*  BUN 18  CREATININE 1.20  CALCIUM 9.4  AST 25  ALT 16*  ALKPHOS 62  BILITOT 0.4   ------------------------------------------------------------------------------------------------------------------ estimated creatinine clearance is 82.9 mL/min (by C-G formula based on SCr of 1.2 mg/dL). ------------------------------------------------------------------------------------------------------------------ No results for input(s): TSH, T4TOTAL, T3FREE, THYROIDAB in the last 72 hours.  Invalid input(s): FREET3   Coagulation profile  Recent Labs Lab 02/04/17 1223  INR 0.95    ------------------------------------------------------------------------------------------------------------------- No results for input(s): DDIMER in the last 72 hours. -------------------------------------------------------------------------------------------------------------------  Cardiac Enzymes  Recent Labs Lab 02/04/17 1223  TROPONINI <0.03   ------------------------------------------------------------------------------------------------------------------ Invalid input(s): POCBNP  ---------------------------------------------------------------------------------------------------------------  Urinalysis    Component Value Date/Time   COLORURINE STRAW (A) 12/09/2016 0930   APPEARANCEUR CLEAR (A) 12/09/2016 0930   APPEARANCEUR Clear 09/23/2012 1020   LABSPEC 1.017 12/09/2016 0930   LABSPEC 1.015 09/23/2012 1020   PHURINE 5.0 12/09/2016 0930   GLUCOSEU >=500 (A) 12/09/2016 0930   GLUCOSEU >=500 09/23/2012 1020   HGBUR NEGATIVE 12/09/2016 0930   BILIRUBINUR NEGATIVE 12/09/2016 0930   BILIRUBINUR Negative 09/23/2012 1020   KETONESUR NEGATIVE 12/09/2016 0930   PROTEINUR NEGATIVE 12/09/2016 0930   NITRITE NEGATIVE 12/09/2016 0930   LEUKOCYTESUR NEGATIVE 12/09/2016 0930   LEUKOCYTESUR Negative 09/23/2012 1020     RADIOLOGY: Mr Brain Wo Contrast  Result Date: 02/04/2017 CLINICAL DATA:  Left facial droop. EXAM: MRI HEAD WITHOUT CONTRAST TECHNIQUE: Multiplanar, multiecho pulse sequences of the brain and surrounding structures were obtained without intravenous contrast. COMPARISON:  Head CT from earlier today FINDINGS: Brain: No acute infarction, hemorrhage, hydrocephalus, extra-axial collection or mass lesion. Cortically based gliosis along the posterior and lateral right temporal lobe, likely remote infarct. Patient has chart history of stroke and there is no associated hemosiderin staining or posttraumatic gliosis elsewhere. Few FLAIR hyperintensities in the cerebral white  matter. Vascular: Major flow voids are preserved. 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 Skull and upper cervical spine: Negative for marrow lesion Sinuses/Orbits: Negative IMPRESSION: 1. No acute finding. 2. Remote, small posterior-lateral right temporal infarct. Electronically Signed   By: Marnee Spring M.D.   On: 02/04/2017 14:12   Dg Chest Portable 1 View  Result Date: 02/04/2017 CLINICAL DATA:  Chest pain beginning at 9:15 a.m. Patient also had slurred speech and stuttering during triaged. History of coronary artery disease and previous MI, CHF, diabetes, never smoked. EXAM: PORTABLE CHEST 1 VIEW COMPARISON:  Chest x-ray of February 09, 2015 FINDINGS: The lungs are adequately inflated and clear. The heart is top-normal in size. The pulmonary vascularity is normal. The mediastinum is normal in width. There is no pleural effusion. The observed bony thorax is unremarkable. IMPRESSION: Top-normal cardiac size without pulmonary edema or other acute cardiopulmonary abnormality. When the patient can tolerate the procedure, a PA and lateral chest x-ray would be useful. Electronically Signed   By: David  [place] M.D.   On: 02/04/2017 12:44   Ct Head Code Stroke W/o Cm  Result Date: 02/04/2017 CLINICAL DATA:  Code stroke. Headache and intermittent slurred speech. EXAM: CT HEAD WITHOUT CONTRAST TECHNIQUE: Contiguous axial images were obtained from the base of the skull through the vertex without intravenous contrast. COMPARISON:  02/15/2012 FINDINGS: Brain: No evidence of acute infarction, hemorrhage, hydrocephalus, extra-axial collection or mass lesion/mass effect.  Cortically based gliosis in the posterior and lateral right temporal cortex. Vascular: No hyperdense vessel or unexpected calcification. Skull: Negative Sinuses/Orbits: Negative Other: These results were called by telephone at the time of interpretation on 02/04/2017 at 12:07 pm to Dr. Willy Eddy , who verbally acknowledged these  results. ASPECTS [facility] Stroke Program Early CT Score) Not scored with this history. IMPRESSION: 1. No acute finding. 2. Right posterior temporal cortical gliosis that is likely post ischemic. Electronically Signed   By: Marnee Spring M.D.   On: 02/04/2017 12:24    EKG: Orders placed or performed during the hospital encounter of 02/04/17  . EKG 12-Lead  . EKG 12-Lead  . ED EKG  . ED EKG    IMPRESSION AND PLAN: Patient is a 62 year old who stopped taking his medications for 2 weeks now has chest pain stroke like symptoms  1. Facial droop Seen by neurology recommended MRI of the brain and stroke workup We'll treat with aspirin PT eval Carotid Dopplers and echocardiogram of the heart  2. Chest pain with history of previous stent consult his cardiologist Continue therapy with aspirin and Effient Check serial cardiac enzymes  3. Diabetes type 2 patient on multiple medications for his diabetes according to his list will need to verify this with his primary care provider I will place him on Levemir for now and sliding scale insulin  4. Hypothyroidism continue Synthroid check a TSH  5. History of CHF type unknown continue Lasix appears compensated  6. Miscellaneous Lovenox for DVT prophylaxis  7. Medical noncompliance patient needs medication monitoring with nursing on discharge case manager consult    All the records are reviewed and case discussed with ED provider. Management plans discussed with the patient, family and they are in agreement.  CODE STATUS: Code Status History    This patient does not have a recorded code status. Please follow your organizational policy for patients in this situation.       TOTAL TIME TAKING CARE OF THIS PATIENT:55 minutes.    Auburn Bilberry M.D on 02/04/2017 at 3:03 PM  Between 7am to 6pm - Pager - [phone]  After 6pm go to [email or web address] - password [email or web address] [facility]  Office  [phone]  CC: Primary  care physician; Margaretann Loveless, MD

## 2017-02-04 NOTE — ED Notes (Signed)
1 pair of shoes, 1 pair of socks, 1 shirt, 1 blue watch removed from patient and placed in patient belongings bag.

## 2017-02-04 NOTE — ED Notes (Signed)
Pt arrived to room

## 2017-02-04 NOTE — ED Notes (Addendum)
Pt states woke up today at approx 0600, noted symptoms at approx 0915. Pt states woke up, ate breakfast, and rode his boke approx 4 miles (per EMS) to [facility]. Pt initially presents to ED for chest pain. Per triage nurse during triage pt speech noted to be slurred and stuttering and worsening during triage. Pt presents to room from CT alert and oriented at this time. Pt is able to stand with some assistance. Pt states has been off of his medications for the last few weeks.

## 2017-02-04 NOTE — Progress Notes (Signed)
Fall risk and allergy armbands placed on patient. Patient refused bed alarm and was educated.

## 2017-02-04 NOTE — ED Notes (Signed)
Took patient to MRI and left with staff.

## 2017-02-04 NOTE — ED Triage Notes (Addendum)
Pt arrived via EMS from [facility]. Pt rode bicycle four miles to get there. EMS called for chest pain. EMS reports NSR, CBG 226. EMS gave nitro spray, and 324 ASA/ Pt states has been out of medication for two weeks. Pt speech initially clear and then became slurred and stuttering in triage. Pt with history of CVA. Pt reports headache. Pt with facial droop noted to left side.

## 2017-02-04 NOTE — ED Notes (Signed)
Pt taken to MRI  

## 2017-02-04 NOTE — Progress Notes (Signed)
CH responded code stroke page for this pt. Pt had just returned from CT when [facility] arrived. Dc and nurse were evaluating pt. Pt was alert and responsive. CH identified pt.'s family in the main lobby, escorted them to pt.'s Rm, and provided words of encouragement, prayer, and ministry of presence.    02/04/17 1500  Clinical Encounter Type  Visited With Patient and family together  Visit Type Initial;Spiritual support  Referral From Nurse  Consult/Referral To Chaplain  Spiritual Encounters  Spiritual Needs Prayer;Emotional;Other (Comment)

## 2017-02-04 NOTE — Progress Notes (Signed)
Pharmacy Note  61 y/o M ordered Lovenox 40 mg daily for DVT prophylaxis.   Filed Weights   02/04/17 1149  Weight: 281 lb (127.5 kg)   Body mass index is 44.01 kg/m.  Estimated Creatinine Clearance: 82.9 mL/min (by C-G formula based on SCr of 1.2 mg/dL).  Will increase Lovenox dosing to 40 mg bid due to weight > 100 kg and BMI > 40.   Luisa Hart, PharmD Clinical Pharmacist

## 2017-02-05 ENCOUNTER — Observation Stay
Admit: 2017-02-05 | Discharge: 2017-02-05 | Disposition: A | Discharge status: Home / Self Care | Payer: Medicaid Other | Attending: Internal Medicine | Admitting: Internal Medicine

## 2017-02-05 ENCOUNTER — Observation Stay: Payer: Medicaid Other

## 2017-02-05 DIAGNOSIS — R4781 Slurred speech: Secondary | ICD-10-CM | POA: Diagnosis not present

## 2017-02-05 LAB — LIPID PANEL
CHOL/HDL RATIO: 5.9 ratio
CHOLESTEROL: 207 mg/dL — AB (ref 0–200)
HDL: 35 mg/dL — ABNORMAL LOW (ref >40)
LDL CALC: 99 mg/dL (ref 0–99)
Triglycerides: 367 mg/dL — ABNORMAL HIGH (ref <150)
VLDL: 73 mg/dL — AB (ref 0–40)

## 2017-02-05 LAB — ECHOCARDIOGRAM COMPLETE
HEIGHTINCHES: 67 in
WEIGHTICAEL: 4496 [oz_av]

## 2017-02-05 LAB — GLUCOSE, CAPILLARY
GLUCOSE-CAPILLARY: 131 mg/dL — AB (ref 65–99)
Glucose-Capillary: 86 mg/dL (ref 65–99)

## 2017-02-05 MED ORDER — LEVOTHYROXINE SODIUM 125 MCG PO TABS
125.0000 ug | ORAL_TABLET | Freq: Every day | ORAL | Status: DC
  Filled 2017-02-05: qty 1

## 2017-02-05 NOTE — Progress Notes (Signed)
CH received a PG from 1C that PT wanted to complete his AD. CH went to room 126 and helped PT finish paper work. CH called notary and found witnesses. AD wa   02/05/17 1445  Clinical Encounter Type  Visited With Patient  Visit Type Follow-up  Referral From Nurse  Consult/Referral To Chaplain  Spiritual Encounters  Spiritual Needs Other (Comment)  s completed.

## 2017-02-05 NOTE — Evaluation (Signed)
Speech Language Pathology Evaluation Patient Details Name: Derrick Barajas MRN: [id number] DOB: [birth year] Today's Date: 02/05/2017 Time: 1340-1440 SLP Time Calculation (min) (ACUTE ONLY): 60 min  Problem List:  Patient Active Problem List   Diagnosis Date Noted  . 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 Controlled type 2 diabetes mellitus without complication (HCC) 10/26/2015  . BP (high blood pressure) 10/26/2015  . Myocardial infarction (HCC) 10/26/2015   Past Medical History:  Past Medical History:  Diagnosis Date  . CAD (coronary artery disease)   . CHF (congestive heart failure) (HCC)   . Diabetes mellitus without complication (HCC)   . Gout   . Hyperlipidemia   . Hypertension   . MI (myocardial infarction) (HCC)   . RA (rheumatoid arthritis) (HCC)   . Sleep apnea   . Stroke [facility])    left facal droop  . Thyroid disease    Past Surgical History:  Past Surgical History:  Procedure Laterality Date  . CIRCUMCISION    . CORONARY ANGIOPLASTY WITH STENT PLACEMENT    . TONSILLECTOMY     HPI:   Pt is a 61 y.o. male with a known history of multiple medical problems h/o MI w/ stent, CAD, DM, congestive heart failure, diabetes, gout, hyperlipidemia, essential hypertension, sleep apnea, RA, thyroid dis., and previous history of CVA w/ left sided deficits who is presenting to the hospital with multiple complaints. He presents with complaint of having acute onset of facial droop as well as starting this morning but w/ no consistent slurred speech reported during the admission process. Patient does have previous history of CVA. He was seen by neurology who recommended further evaluation including MRI of the brain which was negative for acute event. Patient states that he has not been taking his medications for the past 2 weeks d/t not be able to afford them. In reviewing his records he is supposed to be taking 17 medications according to the 2017 medication list. He states that he just did not have  refills or money. Patient also complains of left-sided chest pain that also started earlier today. He denies any shortness of breath.   Assessment / Plan / Recommendation Clinical Impression  Pt appeared to present slight-min Dysfluency of speech w/ slight-min Dysarthria during conversation - pt may be close to, or at, his baseline in these areas d/t previous CVA(of the R temporal lobe) ~5 years ago impacting his speech then. Articulation of speech also appeared impacted by pt's lack of sufficient Dentition. Pt stated he noted his "stuttering" more when he was "stressed" - pt endorsed that he felt he had increased stress in his life currently(SLP encouraged pt to talk w/ MD or Chaplain about this). During motor speech tasks, pt exhibited slight-min decreased tone and ROM in Left labial area; tongue appeared grossly wfl for strength/ROM and coordination. Unsure if this was premorbid from previous CVA. Noted min dysfluency of speech (stuttering) x2. This was noted more at conversational level when pt was talking rapidly and while engaged in conversation w/ MD who was asking questions. When strategies of of slowing his rate, pausing, and over-articulating were implemented, pt was able to increase his articulation of speech and no dysfluency was noted. These strategies were discussed, modeled, and practiced w/ pt for homework/use in conversation w/ others and for discharge. Recommended to pt to f/u w/ primary MD if any other deficits were noted in home setting post discharge and pt could be referred for outpatient ST services for tx. Pt agreed. NSG updated.  SLP Assessment  SLP Recommendation/Assessment: Patient does not need any further Speech Lanaguage Pathology Services (at this time) SLP Visit Diagnosis: Dysarthria and anarthria (R47.1) (Dysfluency)    Follow Up Recommendations  None    Frequency and Duration  n/a        SLP Evaluation Cognition  Overall Cognitive Status: Within Functional  Limits for tasks assessed Arousal/Alertness: Awake/alert Orientation Level: Oriented X4 Attention: Focused;Sustained Focused Attention: Appears intact Sustained Attention: Appears intact Memory: Appears intact Awareness: Appears intact Problem Solving: Appears intact Executive Function: Reasoning Reasoning: Appears intact Behaviors:  (none) Safety/Judgment: Appears intact       Comprehension  Auditory Comprehension Overall Auditory Comprehension: Appears within functional limits for tasks assessed Yes/No Questions: Within Functional Limits Commands: Within Functional Limits Conversation: Complex Other Conversation Comments: min dysfluency noted x2 Interfering Components:  (none) EffectiveTechniques: Slowed speech;Pausing Visual Recognition/Discrimination Discrimination: Not tested Reading Comprehension Reading Status: Not tested    Expression Expression Primary Mode of Expression: Verbal Verbal Expression Overall Verbal Expression: Appears within functional limits for tasks assessed Initiation: No impairment Automatic Speech: Social Response;Name Level of Generative/Spontaneous Verbalization: Sentence;Conversation Repetition: No impairment Naming: No impairment Pragmatics: No impairment Interfering Components:  (none) Non-Verbal Means of Communication:  (none) Written Expression Dominant Hand: Right Written Expression: Not tested   Oral / Motor  Oral Motor/Sensory Function Overall Oral Motor/Sensory Function: Mild impairment Facial ROM: Reduced left (slight) Facial Symmetry: Abnormal symmetry left (slight) Facial Strength: Within Functional Limits Facial Sensation: Within Functional Limits Lingual ROM: Within Functional Limits Lingual Symmetry: Within Functional Limits Lingual Strength: Within Functional Limits Lingual Sensation: Within Functional Limits Velum: Within Functional Limits Mandible: Within Functional Limits Motor Speech Overall Motor Speech:  Impaired at baseline (pt had a previous CVA which impacted Fluency per pt) Respiration: Within functional limits Phonation: Normal Resonance: Within functional limits Articulation: Impaired Level of Impairment: Conversation Intelligibility: Intelligibility reduced Sentence: 75-100% accurate Conversation: 75-100% accurate Motor Planning: Impaired (x2) Level of Impairment: Press photographer Errors: Inconsistent Interfering Components: Inadequate dentition (baseline - appeared to impact articulation) Effective Techniques: Slow rate;Over-articulate;Pause   GO          Functional Assessment Tool Used: clinical judgement Functional Limitations: Motor speech Motor Speech Current Status [phone]): At least 1 percent but less than 20 percent impaired, limited or restricted Motor Speech Goal Status [phone]): At least 1 percent but less than 20 percent impaired, limited or restricted Motor Speech Goal Status [phone]): At least 1 percent but less than 20 percent impaired, limited or restricted          Jerilynn Som, MS, CCC-SLP Watson,Katherine 02/05/2017, 3:51 PM

## 2017-02-05 NOTE — Clinical Social Work Note (Signed)
CSW consulted for Medication Assistance. CSW updated RNCM, who will follow for assistance if appropriate. CSW is signing off as no further needs identified. Please reconsult if a need arises prior to discharge.  Dede Query, MSW, LCSW  Clinical Social Worker  [phone]

## 2017-02-05 NOTE — Care Management Note (Signed)
Case Management Note  Patient Details  Name: Derrick Barajas MRN: [id number] Date of Birth: 21-Apr-1956  Subjective/Objective:   Admitted to [facility] under observation with the diagnosis of facial droop. Lives alone. Sister is [place]. Last seen Dr. Kinnie Feil this month. Prescriptions are filled at [facility] and Medication Management. States that Medication Management helps with his insulin. States he pays a $3.00 co-pay for his other medications at [facility]. Total cost =$40.00.  States he gets $750.00 a month. Spends about $40.00 a month on medications.  Discussed that Medication Management may be able to help with  Some of the other medications.  Uses [facility] and [employer] for transportation. Family will transport.                  Action/Plan: Discussed Medication Management. Gave information on [facility] web site.   Expected Discharge Date:  02/05/17               Expected Discharge Plan:     In-House Referral:     Discharge planning Services     Post Acute Care Choice:    Choice offered to:     DME Arranged:    DME Agency:     HH Arranged:    HH Agency:     Status of Service:     If discussed at [employer], dates discussed:    Additional Comments:  Gwenette Greet, RN MSN CCM Care Management [phone] 02/05/2017, 12:11 PM

## 2017-02-05 NOTE — Evaluation (Signed)
Physical Therapy Evaluation Patient Details Name: Derrick Barajas MRN: [id number] DOB: Aug 06, 1955 Today's Date: 02/05/2017   History of Present Illness  61 y/o male here with facial droop, history of previous CVA.    Clinical Impression  Pt did very well with PT exam and is at his baseline.  He was easily able to circumambulate the nurses' station X 2 with no safety issues, fatigue or other concerns.  Pt feels confident about being able to go home, no further PT intervention required.    Follow Up Recommendations No PT follow up    Equipment Recommendations       Recommendations for Other Services       Precautions / Restrictions Precautions Precautions: None Restrictions Weight Bearing Restrictions: No      Mobility  Bed Mobility Overal bed mobility: Independent                Transfers Overall transfer level: Independent Equipment used: None                Ambulation/Gait Ambulation/Gait assistance: Independent Ambulation Distance (Feet): 350 Feet Assistive device: None       General Gait Details: Pt walks with good speed, confidence and safety  Stairs            Wheelchair Mobility    Modified Rankin (Stroke Patients Only)       Balance Overall balance assessment: Independent                                           Pertinent Vitals/Pain Pain Assessment: No/denies pain    Home Living Family/patient expects to be discharged to:: Private residence Living Arrangements: Alone   Type of Home: Apartment Home Access: Level entry     Home Layout: One level        Prior Function Level of Independence: Independent         Comments: Pt rides his bike to run errands, able to do all he needs     Hand Dominance        Extremity/Trunk Assessment   Upper Extremity Assessment Upper Extremity Assessment: Overall WFL for tasks assessed (h/o R RTC issues, limited elevation, otherwise [facility])    Lower Extremity  Assessment Lower Extremity Assessment: Overall WFL for tasks assessed       Communication   Communication: No difficulties  Cognition Arousal/Alertness: Awake/alert Behavior During Therapy: WFL for tasks assessed/performed Overall Cognitive Status: Within Functional Limits for tasks assessed                                        General Comments      Exercises     Assessment/Plan    PT Assessment Patent does not need any further PT services  PT Problem List         PT Treatment Interventions      PT Goals (Current goals can be found in the Care Plan section)  Acute Rehab PT Goals Patient Stated Goal: go home PT Goal Formulation: All assessment and education complete, DC therapy    Frequency     Barriers to discharge        Co-evaluation               AM-PAC PT "6 Clicks" Daily Activity  Outcome Measure Difficulty turning over in bed (including adjusting bedclothes, sheets and blankets)?: None Difficulty moving from lying on back to sitting on the side of the bed? : None Difficulty sitting down on and standing up from a chair with arms (e.g., wheelchair, bedside commode, etc,.)?: None Help needed moving to and from a bed to chair (including a wheelchair)?: None Help needed walking in hospital room?: None Help needed climbing 3-5 steps with a railing? : None 6 Click Score: 24    End of Session   Activity Tolerance: Patient tolerated treatment well Patient left: in chair;with call bell/phone within reach Nurse Communication: Mobility status PT Visit Diagnosis: Muscle weakness (generalized) (M62.81)    Time: 3893-7342 PT Time Calculation (min) (ACUTE ONLY): 10 min   Charges:   PT Evaluation $PT Eval Low Complexity: 1 Low     PT G Codes:   PT G-Codes **NOT FOR INPATIENT CLASS** Functional Assessment Tool Used: AM-PAC 6 Clicks Basic Mobility Functional Limitation: Mobility: Walking and moving around Mobility: Walking and Moving  Around Current Status (A7681): 0 percent impaired, limited or restricted Mobility: Walking and Moving Around Goal Status (L5726): 0 percent impaired, limited or restricted Mobility: Walking and Moving Around Discharge Status (O0355): 0 percent impaired, limited or restricted    Malachi Pro, DPT 02/05/2017, 11:24 AM

## 2017-02-05 NOTE — Progress Notes (Signed)
OT Screen Note  Patient Details Name: SANTE BIEDERMANN MRN: [id number] DOB: 1955/09/25   Cancelled Treatment:    Reason Eval/Treat Not Completed: OT screened, no needs identified, will sign off. Pt admitted under observation status for facial droop. Pt independent with mobility during PT evaluation this morning. Pt presents with mild facial droop still present, R shoulder flexion strength impaired due to old RTC injury, but UE strength equal and WFL otherwise bilaterally. No difficulty with following commands, visual testing, sensation intact. No dizziness/N/V. Some slight stuttering at times, pt reports this is normal. Pt at baseline independence with self care tasks and functional mobility. No skilled OT needs identified. Will sign off. Please re-consult if additional OT needs arise.   Richrd Prime, MPH, MS, OTR/L ascom [phone] 02/05/17, 11:58 AM

## 2017-02-05 NOTE — Discharge Summary (Signed)
[facility] - [place] at [facility]   PATIENT NAME: Derrick Barajas    MR#:  [id number]  DATE OF BIRTH:  11/04/1955  DATE OF ADMISSION:  02/04/2017 ADMITTING PHYSICIAN: Auburn Bilberry, MD  DATE OF DISCHARGE: 02/05/2017  PRIMARY CARE PHYSICIAN: Margaretann Loveless, MD    ADMISSION DIAGNOSIS:  Slurred speech [R47.81] Chest pain, unspecified type [R07.9]  DISCHARGE DIAGNOSIS:  Active Problems:   Facial droop   SECONDARY DIAGNOSIS:   Past Medical History:  Diagnosis Date  . CAD (coronary artery disease)   . CHF (congestive heart failure) (HCC)   . Diabetes mellitus without complication (HCC)   . Gout   . Hyperlipidemia   . Hypertension   . MI (myocardial infarction) (HCC)   . RA (rheumatoid arthritis) (HCC)   . Sleep apnea   . Stroke [facility])    left facal droop  . Thyroid disease     HOSPITAL COURSE:   Patient is a 61 year old who stopped taking his medications for 2 weeks now has chest pain stroke like symptoms  1. Facial droop Seen by neurology recommended MRI of the brain and stroke workup PT eval Carotid Dopplers and echocardiogram of the heart No acute stroke or carotid stenosis. Pt was non compliant to his meds, so councelled. He is at baseline next day.  2. Chest pain with history of previous stent consult his cardiologist Continue therapy with aspirin and Effient Checked serial cardiac enzymes. Stable. Cardiologist did not suggest any further work ups.  3. Diabetes type 2 patient   resume home meds.  4. Hypothyroidism continue Synthroid check a TSH  5. History of CHF type unknown continue Lasix appears compensated  6. Miscellaneous Lovenox for DVT prophylaxis  7. Medical noncompliance    Case manager spoke to him, he already follows with medication management clinic.  DISCHARGE CONDITIONS:   Stable.  CONSULTS OBTAINED:  Treatment Team:  Kym Groom, MD Marcina Millard, MD  DRUG ALLERGIES:   Allergies   Allergen Reactions  . Sulfa Antibiotics Other (See Comments)    Other reaction(s): Nausea/Vomiting/Diarrhea, Shock/Unconsciousness  . Iodine Nausea And Vomiting  . Atorvastatin Other (See Comments)  . Iodinated Diagnostic Agents   . Shrimp [Shellfish Allergy] Nausea And Vomiting    DISCHARGE MEDICATIONS:   Current Discharge Medication List    CONTINUE these medications which have NOT CHANGED   Details  amLODipine (NORVASC) 10 MG tablet Take 10 mg by mouth every morning.     aspirin 81 MG tablet Take 81 mg by mouth daily.    celecoxib (CELEBREX) 200 MG capsule Take 200 mg by mouth daily.    colchicine 0.6 MG tablet Take 0.6 mg by mouth as needed.    docusate calcium (SURFAK) 240 MG capsule Take 240 mg by mouth at bedtime.    furosemide (LASIX) 20 MG tablet Take 20 mg by mouth daily.    Insulin Degludec (TRESIBA FLEXTOUCH) 200 UNIT/ML SOPN Inject 95 Units into the skin daily.    INVOKANA 300 MG TABS tablet Take 1 tablet by mouth at bedtime.  Refills: 0    isosorbide mononitrate (IMDUR) 30 MG 24 hr tablet Take 30 mg by mouth 2 (two) times daily. Refills: 5    JANUVIA 100 MG tablet Take 100 mg by mouth every morning.  Refills: 2    levothyroxine (SYNTHROID, LEVOTHROID) 125 MCG tablet Take 125 mcg by mouth daily before breakfast.  Refills: 2    liraglutide (VICTOZA) 18 MG/3ML SOPN Inject 1.8 mg into the skin  every morning.    metFORMIN (GLUCOPHAGE) 1000 MG tablet Take 1,000 mg by mouth 2 (two) times daily. Refills: 2    metoprolol (TOPROL-XL) 200 MG 24 hr tablet Take 200 mg by mouth daily.    ramipril (ALTACE) 10 MG capsule Take 10 mg by mouth every morning.  Refills: 2    ranolazine (RANEXA) 500 MG 12 hr tablet Take 500 mg by mouth 2 (two) times daily.    prasugrel (EFFIENT) 10 MG TABS Take 10 mg by mouth daily.     rosuvastatin (CRESTOR) 20 MG tablet Take 20 mg by mouth daily. Refills: 5      STOP taking these medications     cetirizine (ZYRTEC) 10 MG  tablet      insulin detemir (LEVEMIR) 100 UNIT/ML injection          DISCHARGE INSTRUCTIONS:    Follow with PMD in 1-2 weeks.  If you experience worsening of your admission symptoms, develop shortness of breath, life threatening emergency, suicidal or homicidal thoughts you must seek medical attention immediately by calling 911 or calling your MD immediately  if symptoms less severe.  You Must read complete instructions/literature along with all the possible adverse reactions/side effects for all the Medicines you take and that have been prescribed to you. Take any new Medicines after you have completely understood and accept all the possible adverse reactions/side effects.   Please note  You were cared for by a hospitalist during your hospital stay. If you have any questions about your discharge medications or the care you received while you were in the hospital after you are discharged, you can call the unit and asked to speak with the hospitalist on call if the hospitalist that took care of you is not available. Once you are discharged, your primary care physician will handle any further medical issues. Please note that NO REFILLS for any discharge medications will be authorized once you are discharged, as it is imperative that you return to your primary care physician (or establish a relationship with a primary care physician if you do not have one) for your aftercare needs so that they can reassess your need for medications and monitor your lab values.    Today   CHIEF COMPLAINT:   Chief Complaint  Patient presents with  . Chest Pain    HISTORY OF PRESENT ILLNESS:  Derrick Barajas  is a 61 y.o. male with a known history of Multiple medical problems with previous history of stent, congestive heart failure, diabetes, gout, hyperlipidemia, essential hypertension and previous history of CVA who is presenting to the hospital with multiple complaints. She presents with complaint of  having acute onset of facial droop as well as starting this morning. Patient does have previous history of CVA. He was seen by neurology who recommended further evaluation including MRI of the brain. Patient states that he has not been taking his medications for the past 2 weeks. In reviewing his records he is supposed to be taking 17 medications according to the 2017 medication list. He states that he just did not have refills. Patient also complains of left-sided chest pain that also started earlier today. He denies any shortness of breath.   VITAL SIGNS:  Blood pressure (!) 148/77, pulse 77, temperature 97.7 F (36.5 C), temperature source Oral, resp. rate 20, height 5\' 7"  (1.702 m), weight 127.5 kg (281 lb), SpO2 100 %.  I/O:   Intake/Output Summary (Last 24 hours) at 02/05/17 1335 Last data filed at 02/05/17  0800  Gross per 24 hour  Intake              240 ml  Output              500 ml  Net             -260 ml    PHYSICAL EXAMINATION:  GENERAL:  61 y.o.-year-old patient lying in the bed with no acute distress.  EYES: Pupils equal, round, reactive to light and accommodation. No scleral icterus. Extraocular muscles intact.  HEENT: Head atraumatic, normocephalic. Oropharynx and nasopharynx clear.  NECK:  Supple, no jugular venous distention. No thyroid enlargement, no tenderness.  LUNGS: Normal breath sounds bilaterally, no wheezing, rales,rhonchi or crepitation. No use of accessory muscles of respiration.  CARDIOVASCULAR: S1, S2 normal. No murmurs, rubs, or gallops.  ABDOMEN: Soft, non-tender, non-distended. Bowel sounds present. No organomegaly or mass.  EXTREMITIES: No pedal edema, cyanosis, or clubbing.  NEUROLOGIC: Cranial nerves II through XII are intact. Muscle strength 5/5 in all extremities. Sensation intact. Gait not checked.  PSYCHIATRIC: The patient is alert and oriented x 3.  SKIN: No obvious rash, lesion, or ulcer.   DATA REVIEW:   CBC  Recent Labs Lab  02/04/17 1223  WBC 5.6  HGB 14.0  HCT 42.4  PLT 203    Chemistries   Recent Labs Lab 02/04/17 1223  NA 140  K 4.1  CL 105  CO2 26  GLUCOSE 137*  BUN 18  CREATININE 1.20  CALCIUM 9.4  AST 25  ALT 16*  ALKPHOS 62  BILITOT 0.4    Cardiac Enzymes  Recent Labs Lab 02/04/17 2249  TROPONINI <0.03    Microbiology Results  No results found for this or any previous visit.  RADIOLOGY:  Mr Brain Wo Contrast  Result Date: 02/04/2017 CLINICAL DATA:  Left facial droop. EXAM: MRI HEAD WITHOUT CONTRAST TECHNIQUE: Multiplanar, multiecho pulse sequences of the brain and surrounding structures were obtained without intravenous contrast. COMPARISON:  Head CT from earlier today FINDINGS: Brain: No acute infarction, hemorrhage, hydrocephalus, extra-axial collection or mass lesion. Cortically based gliosis along the posterior and lateral right temporal lobe, likely remote infarct. Patient has chart history of stroke and there is no associated hemosiderin staining or posttraumatic gliosis elsewhere. Few FLAIR hyperintensities in the cerebral white matter. Vascular: Major flow voids are preserved. 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 Skull and upper cervical spine: Negative for marrow lesion Sinuses/Orbits: Negative IMPRESSION: 1. No acute finding. 2. Remote, small posterior-lateral right temporal infarct. Electronically Signed   By: Marnee Spring M.D.   On: 02/04/2017 14:12   US Carotid Bilateral (at [facility])  Result Date: 02/05/2017 CLINICAL DATA:  CVA. EXAM: BILATERAL CAROTID DUPLEX ULTRASOUND TECHNIQUE: Wallace Cullens scale imaging, color Doppler and duplex ultrasound were performed of bilateral carotid and vertebral arteries in the neck. COMPARISON:  MRI 02/04/2017. FINDINGS: Criteria: Quantification of carotid stenosis is based on velocity parameters that correlate the residual internal carotid diameter with NASCET-based stenosis levels, using the diameter of the distal  internal carotid lumen as the denominator for stenosis measurement. The following velocity measurements were obtained: RIGHT ICA:  66/23 cm/sec CCA:  63/15 cm/sec SYSTOLIC ICA/CCA RATIO:  1.1 DIASTOLIC ICA/CCA RATIO:  1.5 ECA:  75 cm/sec LEFT ICA:  84/24 cm/sec CCA:  64/14 cm/sec SYSTOLIC ICA/CCA RATIO:  1.3 DIASTOLIC ICA/CCA RATIO:  1.7 ECA:  140 cm/sec RIGHT CAROTID ARTERY: Diffuse mild right carotid atherosclerotic vascular disease. No flow limiting stenosis. RIGHT VERTEBRAL  ARTERY:  Patent with antegrade flow. LEFT CAROTID ARTERY: Diffuse mild left carotid atherosclerotic vascular disease. No flow limiting stenosis. LEFT VERTEBRAL ARTERY:  Patent with antegrade flow. IMPRESSION: 1. 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 No flow limiting stenosis. Degree of stenosis less than 50% bilaterally. 2. Vertebrals are patent antegrade flow . Electronically Signed   By: Maisie Fus  Register   On: 02/05/2017 11:17   Dg Chest Portable 1 View  Result Date: 02/04/2017 CLINICAL DATA:  Chest pain beginning at 9:15 a.m. Patient also had slurred speech and stuttering during triaged. History of coronary artery disease and previous MI, CHF, diabetes, never smoked. EXAM: PORTABLE CHEST 1 VIEW COMPARISON:  Chest x-ray of February 09, 2015 FINDINGS: The lungs are adequately inflated and clear. The heart is top-normal in size. The pulmonary vascularity is normal. The mediastinum is normal in width. There is no pleural effusion. The observed bony thorax is unremarkable. IMPRESSION: Top-normal cardiac size without pulmonary edema or other acute cardiopulmonary abnormality. When the patient can tolerate the procedure, a PA and lateral chest x-ray would be useful. Electronically Signed   By: David  [place] M.D.   On: 02/04/2017 12:44   Ct Head Code Stroke W/o Cm  Result Date: 02/04/2017 CLINICAL DATA:  Code stroke. Headache and intermittent slurred speech. EXAM: CT HEAD WITHOUT CONTRAST TECHNIQUE: Contiguous axial  images were obtained from the base of the skull through the vertex without intravenous contrast. COMPARISON:  02/15/2012 FINDINGS: Brain: No evidence of acute infarction, hemorrhage, hydrocephalus, extra-axial collection or mass lesion/mass effect. Cortically based gliosis in the posterior and lateral right temporal cortex. Vascular: No hyperdense vessel or unexpected calcification. Skull: Negative Sinuses/Orbits: Negative Other: These results were called by telephone at the time of interpretation on 02/04/2017 at 12:07 pm to Dr. Willy Eddy , who verbally acknowledged these results. ASPECTS [facility] Stroke Program Early CT Score) Not scored with this history. IMPRESSION: 1. No acute finding. 2. Right posterior temporal cortical gliosis that is likely post ischemic. Electronically Signed   By: Marnee Spring M.D.   On: 02/04/2017 12:24    EKG:   Orders placed or performed during the hospital encounter of 02/04/17  . EKG 12-Lead  . EKG 12-Lead  . ED EKG  . ED EKG  . EKG      Management plans discussed with the patient, family and they are in agreement.  CODE STATUS:     Code Status Orders        Start     Ordered   02/04/17 1838  Full code  Continuous     02/04/17 1838    Code Status History    Date Active Date Inactive Code Status Order ID Comments User Context   This patient has a current code status but no historical code status.      TOTAL TIME TAKING CARE OF THIS PATIENT: 35 minutes.    Altamese Dilling M.D on 02/05/2017 at 1:35 PM  Between 7am to 6pm - Pager - [phone]  After 6pm go to [email or web address] - password [employer]  [facility] [place] [facility]  Office  [phone]  CC: Primary care physician; Margaretann Loveless, MD   Note: This dictation was prepared with Dragon dictation along with smaller phrase technology. Any transcriptional errors that result from this process are unintentional.

## 2017-02-05 NOTE — Progress Notes (Signed)
Subjective: Patient reports that he is feeling at baseline today.    Objective: Current vital signs: BP (!) 148/77   Pulse 77   Temp 97.7 F (36.5 C) (Oral)   Resp 20   Ht 5\' 7"  (1.702 m)   Wt 127.5 kg (281 lb)   SpO2 100%   BMI 44.01 kg/m  Vital signs in last 24 hours: Temp:  [97.5 F (36.4 C)-98.3 F (36.8 C)] 97.7 F (36.5 C) (07/31 0655) Pulse Rate:  [77-96] 77 (07/31 0855) Resp:  [10-30] 20 (07/31 0655) BP: (117-161)/(70-103) 148/77 (07/31 0855) SpO2:  [95 %-100 %] 100 % (07/31 0855) Weight:  [127.5 kg (281 lb)] 127.5 kg (281 lb) (07/30 1149)  Intake/Output from previous day: No intake/output data recorded. Intake/Output this shift: Total I/O In: 240 [P.O.:240] Out: 500 [Urine:500] Nutritional status: Diet heart healthy/carb modified Room service appropriate? Yes; Fluid consistency: Thin  Neurologic Exam: Mental Status: Alert, oriented, thought content appropriate.  Speech fluent without evidence of aphasia.  Some stuttering noted at times.  Able to follow 3 step commands without difficulty. Cranial Nerves: II: Discs flat bilaterally; Visual fields grossly normal, pupils equal, round, reactive to light and accommodation III,IV, VI: ptosis not present, extra-ocular motions intact bilaterally V,VII: left facial droop, facial light touch sensation normal bilaterally VIII: hearing normal bilaterally IX,X: gag reflex present XI: bilateral shoulder shrug XII: midline tongue extension Motor: Right :  Upper extremity   4+/5                                               Left:     Upper extremity   5/5             Lower extremity   5/5                                                              Lower extremity   5/5 Tone and bulk:normal tone throughout; no atrophy noted Sensory: Pinprick and light touch intact throughout, bilaterally Deep Tendon Reflexes: 2+ in the upper extremities and absent in the lower extremities   Lab Results: Basic Metabolic Panel:  Recent  Labs Lab 02/04/17 1223  NA 140  K 4.1  CL 105  CO2 26  GLUCOSE 137*  BUN 18  CREATININE 1.20  CALCIUM 9.4    Liver Function Tests:  Recent Labs Lab 02/04/17 1223  AST 25  ALT 16*  ALKPHOS 62  BILITOT 0.4  PROT 6.6  ALBUMIN 3.7   No results for input(s): LIPASE, AMYLASE in the last 168 hours. No results for input(s): AMMONIA in the last 168 hours.  CBC:  Recent Labs Lab 02/04/17 1223  WBC 5.6  NEUTROABS 3.5  HGB 14.0  HCT 42.4  MCV 87.9  PLT 203    Cardiac Enzymes:  Recent Labs Lab 02/04/17 1223 02/04/17 1847 02/04/17 2249  TROPONINI <0.03 <0.03 <0.03    Lipid Panel:  Recent Labs Lab 02/05/17 0401  CHOL 207*  TRIG 367*  HDL 35*  CHOLHDL 5.9  VLDL 73*  LDLCALC 99    CBG:  Recent Labs Lab 02/04/17 1208 02/04/17 1948 02/05/17 0748  GLUCAP 147* 105*  86    Microbiology: No results found for this or any previous visit.  Coagulation Studies:  Recent Labs  02/04/17 1223  LABPROT 12.7  INR 0.95    Imaging: Mr Brain Wo Contrast  Result Date: 02/04/2017 CLINICAL DATA:  Left facial droop. EXAM: MRI HEAD WITHOUT CONTRAST TECHNIQUE: Multiplanar, multiecho pulse sequences of the brain and surrounding structures were obtained without intravenous contrast. COMPARISON:  Head CT from earlier today FINDINGS: Brain: No acute infarction, hemorrhage, hydrocephalus, extra-axial collection or mass lesion. Cortically based gliosis along the posterior and lateral right temporal lobe, likely remote infarct. Patient has chart history of stroke and there is no associated hemosiderin staining or posttraumatic gliosis elsewhere. Few FLAIR hyperintensities in the cerebral white matter. Vascular: Major flow voids are preserved. 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 Skull and upper cervical spine: Negative for marrow lesion Sinuses/Orbits: Negative IMPRESSION: 1. No acute finding. 2. Remote, small posterior-lateral right temporal infarct.  Electronically Signed   By: Marnee Spring M.D.   On: 02/04/2017 14:12   Dg Chest Portable 1 View  Result Date: 02/04/2017 CLINICAL DATA:  Chest pain beginning at 9:15 a.m. Patient also had slurred speech and stuttering during triaged. History of coronary artery disease and previous MI, CHF, diabetes, never smoked. EXAM: PORTABLE CHEST 1 VIEW COMPARISON:  Chest x-ray of February 09, 2015 FINDINGS: The lungs are adequately inflated and clear. The heart is top-normal in size. The pulmonary vascularity is normal. The mediastinum is normal in width. There is no pleural effusion. The observed bony thorax is unremarkable. IMPRESSION: Top-normal cardiac size without pulmonary edema or other acute cardiopulmonary abnormality. When the patient can tolerate the procedure, a PA and lateral chest x-ray would be useful. Electronically Signed   By: David  [place] M.D.   On: 02/04/2017 12:44   Ct Head Code Stroke W/o Cm  Result Date: 02/04/2017 CLINICAL DATA:  Code stroke. Headache and intermittent slurred speech. EXAM: CT HEAD WITHOUT CONTRAST TECHNIQUE: Contiguous axial images were obtained from the base of the skull through the vertex without intravenous contrast. COMPARISON:  02/15/2012 FINDINGS: Brain: No evidence of acute infarction, hemorrhage, hydrocephalus, extra-axial collection or mass lesion/mass effect. Cortically based gliosis in the posterior and lateral right temporal cortex. Vascular: No hyperdense vessel or unexpected calcification. Skull: Negative Sinuses/Orbits: Negative Other: These results were called by telephone at the time of interpretation on 02/04/2017 at 12:07 pm to Dr. Willy Eddy , who verbally acknowledged these results. ASPECTS [facility] Stroke Program Early CT Score) Not scored with this history. IMPRESSION: 1. No acute finding. 2. Right posterior temporal cortical gliosis that is likely post ischemic. Electronically Signed   By: Marnee Spring M.D.   On: 02/04/2017 12:24     Medications:  I have reviewed the patient's current medications. Scheduled: . amLODipine  10 mg Oral BH-q7a  . aspirin EC  81 mg Oral Daily  . celecoxib  200 mg Oral Daily  . docusate sodium  100 mg Oral Daily  . enoxaparin (LOVENOX) injection  40 mg Subcutaneous BID  . furosemide  20 mg Oral Daily  . insulin aspart  0-9 Units Subcutaneous TID WC  . insulin detemir  35 Units Subcutaneous QHS  . isosorbide mononitrate  30 mg Oral BID  . levothyroxine  125 mcg Oral QAC breakfast  . loratadine  10 mg Oral Daily  . metoprolol  200 mg Oral Daily  . prasugrel  10 mg Oral Daily  . ramipril  10 mg Oral BH-q7a  . ranolazine  500 mg Oral BID  . rosuvastatin  20 mg Oral Daily    Assessment/Plan: Patient reports feeling at baseline today.  Home medications have been restarted.  MRI of the brain reviewed and shows no acute changes.  LDL 99.  Recommendations: 1.  Continue home anticoagulation regimen 2.  Statin for lipid management with target LDL<70.    LOS: 0 days   Thana Farr, MD Neurology [phone] 02/05/2017  11:13 AM

## 2017-02-05 NOTE — Progress Notes (Signed)
*  PRELIMINARY RESULTS* Echocardiogram 2D Echocardiogram has been performed.  Cristela Blue 02/05/2017, 11:33 AM

## 2017-02-05 NOTE — Consult Note (Signed)
[facility]  CARDIOLOGY CONSULT NOTE  Patient ID: Derrick Barajas MRN: [id number] DOB/AGE: 1956-05-19 61 y.o.  Admit date: 02/04/2017 Referring Physician Allena Katz Primary Physician Yves Dill Primary Cardiologist [facility] Reason for Consultation Chest pain  HPI: 61 year old male referred for evaluation of chest pain. The patient has a history of coronary artery disease, status post PCI with DES in 2012, diastolic CHF, type 2 diabetes, hyperlipidemia, hypertension, and prior stroke. The patient reports that he [place], and when he arrived, he felt weakness, dizziness, and developed left-sided chest discomfort, described as "pins and needles and spasms" without radiation or nausea. The episode of chest pain lasted about 5 minutes without recurrence. The director at the recreation center noticed the patient to have unilateral facial droop and slurred speech, and she call EMS. The patient reports that he has not taken any of his medications in the last 2 weeks due to not having refills. Admission labs notable for serial troponin <0.03. Brain MRI negative for acute findings. ECG revealed normal sinus rhythm at a rate of 91 bpm without acute ST or T wave abnormalities. Currently, the patient denies chest pain or shortness of breath.  Review of systems complete and found to be negative unless listed above     Past Medical History:  Diagnosis Date  . CAD (coronary artery disease)   . CHF (congestive heart failure) (HCC)   . Diabetes mellitus without complication (HCC)   . Gout   . Hyperlipidemia   . Hypertension   . MI (myocardial infarction) (HCC)   . RA (rheumatoid arthritis) (HCC)   . Sleep apnea   . Stroke [facility])    left facal droop  . Thyroid disease     Past Surgical History:  Procedure Laterality Date  . CIRCUMCISION    . CORONARY ANGIOPLASTY WITH STENT PLACEMENT    . TONSILLECTOMY      Prescriptions Prior to  Admission  Medication Sig Dispense Refill Last Dose  . amLODipine (NORVASC) 10 MG tablet Take 10 mg by mouth every morning.    Past Month at Unknown time  . aspirin 81 MG tablet Take 81 mg by mouth daily.   Past Month at Unknown time  . celecoxib (CELEBREX) 200 MG capsule Take 200 mg by mouth daily.   Past Month at Unknown time  . cetirizine (ZYRTEC) 10 MG tablet Take 10 mg by mouth daily.   PRN at PRN  . colchicine 0.6 MG tablet Take 0.6 mg by mouth as needed.   PRN at PRN  . docusate calcium (SURFAK) 240 MG capsule Take 240 mg by mouth at bedtime.   PRN at PRN  . furosemide (LASIX) 20 MG tablet Take 20 mg by mouth daily.   Past Month at Unknown time  . Insulin Degludec (TRESIBA FLEXTOUCH) 200 UNIT/ML SOPN Inject 95 Units into the skin daily.   02/04/2017 at Unknown time  . INVOKANA 300 MG TABS tablet Take 1 tablet by mouth at bedtime.   0 Past Month at Unknown time  . isosorbide mononitrate (IMDUR) 30 MG 24 hr tablet Take 30 mg by mouth 2 (two) times daily.  5 Past Month at Unknown time  . JANUVIA 100 MG tablet Take 100 mg by mouth every morning.   2 Past Month at Unknown time  . levothyroxine (SYNTHROID, LEVOTHROID) 125 MCG tablet Take 125 mcg by mouth daily before breakfast.   2 Past Month at Unknown time  . liraglutide (  VICTOZA) 18 MG/3ML SOPN Inject 1.8 mg into the skin every morning.   Past Month at Unknown time  . metFORMIN (GLUCOPHAGE) 1000 MG tablet Take 1,000 mg by mouth 2 (two) times daily.  2 Past Month at Unknown time  . metoprolol (TOPROL-XL) 200 MG 24 hr tablet Take 200 mg by mouth daily.   Past Month at Unknown time  . metoprolol succinate (TOPROL-XL) 100 MG 24 hr tablet Take 100 mg by mouth daily. Take with or immediately following a meal.   Past Month at Unknown time  . ramipril (ALTACE) 10 MG capsule Take 10 mg by mouth every morning.   2 Past Month at Unknown time  . ranolazine (RANEXA) 500 MG 12 hr tablet Take 500 mg by mouth 2 (two) times daily.   Past Month at Unknown time   . insulin detemir (LEVEMIR) 100 UNIT/ML injection Inject 85-90 Units into the skin 2 (two) times daily. Take 90 units in the morning and 85 units in the evening before meals.   Not Taking at Unknown time  . prasugrel (EFFIENT) 10 MG TABS Take 10 mg by mouth daily.    Not Taking at Unknown time  . rosuvastatin (CRESTOR) 20 MG tablet Take 20 mg by mouth daily.  5 Taking   Social History   Social History  . Marital status: Single    Spouse name: N/A  . Number of children: N/A  . Years of education: N/A   Occupational History  . Not on file.   Social History Main Topics  . Smoking status: Never Smoker  . Smokeless tobacco: Never Used  . Alcohol use No  . Drug use: No  . Sexual activity: No   Other Topics Concern  . Not on file   Social History Narrative  . No narrative on file    Family History  Problem Relation Age of Onset  . Diabetes Mother   . Diabetes Brother       Review of systems complete and found to be negative unless listed above      PHYSICAL EXAM  General: Well developed, well nourished, in no acute distress HEENT:  Normocephalic and atramatic Neck:  No JVD.  Lungs: Clear bilaterally to auscultation. Normal effort of breathing. Heart: HRRR . Normal S1 and S2 without gallops or murmurs.  Abdomen: Bowel sounds are positive, abdomen soft and non-tender  Msk:  Back normal, sitting upright in chair Extremities: 1+ bilateral lower extremity edema Neuro: Alert and oriented X 3. No obvious significant unilateral facial droop or unilateral extremity weakness. Speech slurred, may be patient's baseline Psych:  Good affect, responds appropriately  Labs:   Lab Results  Component Value Date   WBC 5.6 02/04/2017   HGB 14.0 02/04/2017   HCT 42.4 02/04/2017   MCV 87.9 02/04/2017   PLT 203 02/04/2017    Recent Labs Lab 02/04/17 1223  NA 140  K 4.1  CL 105  CO2 26  BUN 18  CREATININE 1.20  CALCIUM 9.4  PROT 6.6  BILITOT 0.4  ALKPHOS 62  ALT 16*   AST 25  GLUCOSE 137*   Lab Results  Component Value Date   CKTOTAL 309 (H) 09/23/2012   CKMB 3.1 09/23/2012   TROPONINI <0.03 02/04/2017    Lab Results  Component Value Date   CHOL 207 (H) 02/05/2017   CHOL 139 08/29/2011   Lab Results  Component Value Date   HDL 35 (L) 02/05/2017   HDL 38 (L) 08/29/2011   Lab  Results  Component Value Date   LDLCALC 99 02/05/2017   LDLCALC 64 08/29/2011   Lab Results  Component Value Date   TRIG 367 (H) 02/05/2017   TRIG 185 08/29/2011   Lab Results  Component Value Date   CHOLHDL 5.9 02/05/2017   No results found for: LDLDIRECT    Radiology: Mr Brain Wo Contrast  Result Date: 02/04/2017 CLINICAL DATA:  Left facial droop. EXAM: MRI HEAD WITHOUT CONTRAST TECHNIQUE: Multiplanar, multiecho pulse sequences of the brain and surrounding structures were obtained without intravenous contrast. COMPARISON:  Head CT from earlier today FINDINGS: Brain: No acute infarction, hemorrhage, hydrocephalus, extra-axial collection or mass lesion. Cortically based gliosis along the posterior and lateral right temporal lobe, likely remote infarct. Patient has chart history of stroke and there is no associated hemosiderin staining or posttraumatic gliosis elsewhere. Few FLAIR hyperintensities in the cerebral white matter. Vascular: Major flow voids are preserved. 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 Skull and upper cervical spine: Negative for marrow lesion Sinuses/Orbits: Negative IMPRESSION: 1. No acute finding. 2. Remote, small posterior-lateral right temporal infarct. Electronically Signed   By: Marnee Spring M.D.   On: 02/04/2017 14:12   Dg Chest Portable 1 View  Result Date: 02/04/2017 CLINICAL DATA:  Chest pain beginning at 9:15 a.m. Patient also had slurred speech and stuttering during triaged. History of coronary artery disease and previous MI, CHF, diabetes, never smoked. EXAM: PORTABLE CHEST 1 VIEW COMPARISON:  Chest x-ray of February 09, 2015 FINDINGS: The lungs are adequately inflated and clear. The heart is top-normal in size. The pulmonary vascularity is normal. The mediastinum is normal in width. There is no pleural effusion. The observed bony thorax is unremarkable. IMPRESSION: Top-normal cardiac size without pulmonary edema or other acute cardiopulmonary abnormality. When the patient can tolerate the procedure, a PA and lateral chest x-ray would be useful. Electronically Signed   By: David  [place] M.D.   On: 02/04/2017 12:44   Ct Head Code Stroke W/o Cm  Result Date: 02/04/2017 CLINICAL DATA:  Code stroke. Headache and intermittent slurred speech. EXAM: CT HEAD WITHOUT CONTRAST TECHNIQUE: Contiguous axial images were obtained from the base of the skull through the vertex without intravenous contrast. COMPARISON:  02/15/2012 FINDINGS: Brain: No evidence of acute infarction, hemorrhage, hydrocephalus, extra-axial collection or mass lesion/mass effect. Cortically based gliosis in the posterior and lateral right temporal cortex. Vascular: No hyperdense vessel or unexpected calcification. Skull: Negative Sinuses/Orbits: Negative Other: These results were called by telephone at the time of interpretation on 02/04/2017 at 12:07 pm to Dr. Willy Eddy , who verbally acknowledged these results. ASPECTS [facility] Stroke Program Early CT Score) Not scored with this history. IMPRESSION: 1. No acute finding. 2. Right posterior temporal cortical gliosis that is likely post ischemic. Electronically Signed   By: Marnee Spring M.D.   On: 02/04/2017 12:24    EKG: Sinus rhythm, 82 bpm  ASSESSMENT AND PLAN:  1. Chest pain with history of coronary artery disease status post PCI with DES in 2012, with serial troponin <0.03, without recurrence of chest pain, and ECG revealing normal sinus rhythm with left axis deviation and poor R wave progression.  2. Diastolic CHF, peripheral edema, does not appear to be fluid-overloaded. EF 61% in 2010 3. Facial  droop and slurred speech, with no acute findings on MRI  Recommendations: 1. Continue current therapy. 2. Resume aspirin, Effient, furosemide, isosorbide mononitrate, metoprolol, ramipril, rosuvastatin 3. May need Holter monitor as outpatient to evaluate for atrial fibrillation. 4. Review  2D echocardiogram 5. Defer Lexiscan in the setting of stroke-like symptoms.   Signed: Leanora Ivanoff PA-C 02/05/2017, 8:15 AM

## 2017-02-05 NOTE — Care Management (Signed)
Unable to find a family member that could come and transport him home due to "No gas." Taxi voucher issued Gwenette Greet RN MSN CCM Care Management [phone]

## 2017-02-06 LAB — HEMOGLOBIN A1C
HEMOGLOBIN A1C: 7.5 % — AB (ref 4.8–5.6)
MEAN PLASMA GLUCOSE: 169 mg/dL

## 2017-02-19 ENCOUNTER — Ambulatory Visit: Payer: Medicaid Other | Admitting: Podiatry

## 2017-03-08 ENCOUNTER — Ambulatory Visit (INDEPENDENT_AMBULATORY_CARE_PROVIDER_SITE_OTHER): Payer: Medicaid Other | Admitting: Podiatry

## 2017-03-08 ENCOUNTER — Encounter: Payer: Self-pay | Admitting: Podiatry

## 2017-03-08 DIAGNOSIS — M79676 Pain in unspecified toe(s): Secondary | ICD-10-CM

## 2017-03-08 DIAGNOSIS — E0842 Diabetes mellitus due to underlying condition with diabetic polyneuropathy: Secondary | ICD-10-CM

## 2017-03-08 DIAGNOSIS — B351 Tinea unguium: Secondary | ICD-10-CM

## 2017-03-16 NOTE — Progress Notes (Signed)
   SUBJECTIVE Patient with a history of diabetes mellitus presents to office today complaining of elongated, thickened nails. Pain while ambulating in shoes. Patient is unable to trim their own nails.   Past Medical History:  Diagnosis Date  . CAD (coronary artery disease)   . CHF (congestive heart failure) (HCC)   . Diabetes mellitus without complication (HCC)   . Gout   . Hyperlipidemia   . Hypertension   . MI (myocardial infarction) (HCC)   . RA (rheumatoid arthritis) (HCC)   . Sleep apnea   . Stroke [facility])    left facal droop  . Thyroid disease     OBJECTIVE General Patient is awake, alert, and oriented x 3 and in no acute distress. Derm Skin is dry and supple bilateral. Negative open lesions or macerations. Remaining integument unremarkable. Nails are tender, long, thickened and dystrophic with subungual debris, consistent with onychomycosis, 1-5 bilateral. No signs of infection noted. Vasc  DP and PT pedal pulses palpable bilaterally. Temperature gradient within normal limits.  Neuro Epicritic and protective threshold sensation diminished bilaterally.  Musculoskeletal Exam No symptomatic pedal deformities noted bilateral. Muscular strength within normal limits.  ASSESSMENT 1. Diabetes Mellitus w/ peripheral neuropathy 2. Onychomycosis of nail due to dermatophyte bilateral 3. Pain in foot bilateral  PLAN OF CARE 1. Patient evaluated today. 2. Instructed to maintain good pedal hygiene and foot care. Stressed importance of controlling blood sugar.  3. Mechanical debridement of nails 1-5 bilaterally performed using a nail nipper. Filed with dremel without incident.  4. Return to clinic in 3 mos.     Felecia Shelling, DPM [facility]  Dr. Felecia Shelling, DPM    [address]                                        [place], [place] [address]                Office [phone]  Fax [phone]

## 2017-04-01 ENCOUNTER — Encounter: Payer: Medicaid Other | Admitting: Pharmacist

## 2017-05-27 ENCOUNTER — Encounter (INDEPENDENT_AMBULATORY_CARE_PROVIDER_SITE_OTHER): Payer: Self-pay

## 2017-05-27 ENCOUNTER — Other Ambulatory Visit: Payer: Self-pay

## 2017-05-27 ENCOUNTER — Encounter: Payer: Self-pay | Admitting: Pharmacist

## 2017-05-27 ENCOUNTER — Ambulatory Visit: Payer: Medicaid Other | Admitting: Pharmacist

## 2017-05-27 VITALS — Wt 282.0 lb

## 2017-05-27 DIAGNOSIS — Z79899 Other long term (current) drug therapy: Secondary | ICD-10-CM

## 2017-05-27 NOTE — Progress Notes (Signed)
Medication Management Clinic Visit Note  Patient: Derrick Barajas MRN: [id number] Date of Birth: Jan 24, 1956 PCP: Margaretann Loveless, MD   [name] 61 y.o. male presents for a 5 month follow up visit with the pharmacist today. The visit was cut short as his peer support person had another appointment to attend.  Wt 282 lb (127.9 kg)   BMI 44.17 kg/m   Patient Information   Past Medical History:  Diagnosis Date  . CAD (coronary artery disease)   . CHF (congestive heart failure) (HCC)   . Diabetes mellitus without complication (HCC)   . Gout   . Hyperlipidemia   . Hypertension   . MI (myocardial infarction) (HCC)   . RA (rheumatoid arthritis) (HCC)   . Sleep apnea   . Stroke [facility])    left facal droop  . Thyroid disease       Past Surgical History:  Procedure Laterality Date  . CIRCUMCISION    . CORONARY ANGIOPLASTY WITH STENT PLACEMENT    . TONSILLECTOMY       Family History  Problem Relation Age of Onset  . Diabetes Mother   . Diabetes Brother     New Diagnoses (since last visit):   Family Support: Poor  Lifestyle Diet: Breakfast: eggs, [place] bacon or cereal Lunch: varies Dinner: varies  Drinks: diet soda or tea, Splenda Kool-Aid, water            Social History   Substance and Sexual Activity  Alcohol Use No  . Alcohol/week: 0.0 oz      Social History   Tobacco Use  Smoking Status Never Smoker  Smokeless Tobacco Never Used      Health Maintenance  Topic Date Due  . Hepatitis C Screening  [birth year]  . PNEUMOCOCCAL POLYSACCHARIDE VACCINE (1) 01/12/1958  . FOOT EXAM  01/12/1966  . OPHTHALMOLOGY EXAM  01/12/1966  . HIV Screening  01/13/1971  . TETANUS/TDAP  01/13/1975  . COLONOSCOPY  01/12/2006  . INFLUENZA VACCINE  02/06/2017  . HEMOGLOBIN A1C  08/08/2017    Outpatient Encounter Medications as of 05/27/2017  Medication Sig  . ACCU-CHEK AVIVA PLUS test strip TEST TID  . amLODipine (NORVASC) 10 MG tablet Take 10 mg by mouth  every morning.   Marland Kitchen aspirin 81 MG tablet Take 81 mg by mouth daily.  . celecoxib (CELEBREX) 200 MG capsule Take 200 mg by mouth daily.  . colchicine 0.6 MG tablet Take 0.6 mg by mouth as needed.  . docusate sodium (COLACE) 100 MG capsule Take 100 mg at bedtime by mouth.   . fluticasone (FLONASE) 50 MCG/ACT nasal spray INSTILL 1 SPRAY IEN QD  . furosemide (LASIX) 20 MG tablet Take 20 mg by mouth daily.  . insulin degludec (TRESIBA) 100 UNIT/ML SOPN FlexTouch Pen Inject 110 Units every morning into the skin.  . INVOKANA 300 MG TABS tablet Take 1 tablet by mouth at bedtime.   . isosorbide mononitrate (IMDUR) 60 MG 24 hr tablet Take 60 mg daily by mouth.  . levothyroxine (SYNTHROID, LEVOTHROID) 125 MCG tablet Take 125 mcg by mouth daily before breakfast.   . liraglutide (VICTOZA) 18 MG/3ML SOPN Inject 1.8 mg into the skin every morning.  . metoprolol succinate (TOPROL-XL) 100 MG 24 hr tablet Take 100 mg daily by mouth. Take with or immediately following a meal.  . prasugrel (EFFIENT) 10 MG TABS Take 10 mg by mouth daily.   . ramipril (ALTACE) 10 MG capsule Take 10 mg by mouth every morning.   Marland Kitchen  ranolazine (RANEXA) 500 MG 12 hr tablet Take 500 mg by mouth 2 (two) times daily.  . rosuvastatin (CRESTOR) 20 MG tablet Take 20 mg by mouth daily.  . [DISCONTINUED] docusate sodium (COLACE) 50 MG capsule Take 50 mg at bedtime by mouth.   . [DISCONTINUED] Insulin Degludec (TRESIBA FLEXTOUCH) 200 UNIT/ML SOPN Inject 110 Units daily into the skin.   . hydroxypropyl methylcellulose / hypromellose (ISOPTO TEARS / GONIOVISC) 2.5 % ophthalmic solution 1 drop as needed.  . [DISCONTINUED] Aspirin-Calcium Carbonate 81-777 MG TABS Take by mouth.  . [DISCONTINUED] docusate calcium (SURFAK) 240 MG capsule Take 240 mg by mouth at bedtime.  . [DISCONTINUED] insulin glargine (LANTUS) 100 UNIT/ML injection Inject into the skin.  . [DISCONTINUED] isosorbide mononitrate (IMDUR) 30 MG 24 hr tablet Take 30 mg by mouth 2 (two)  times daily.  . [DISCONTINUED] metoprolol (TOPROL-XL) 200 MG 24 hr tablet Take 200 mg by mouth daily.  . [DISCONTINUED] TRESIBA FLEXTOUCH 100 UNIT/ML SOPN FlexTouch Pen USE 95 UNITS AS DIRECTED QD   No facility-administered encounter medications on file as of 05/27/2017.    Assessment and Plan:  Compliance/Pill Box:  Patient brought his pill box and pill bottles to the appointment. He is fairly familiar with the majority of his medications and what they are used for. Reviewed dates on pill bottles, appears to be compliant with monthly refills. Patient also states that [facility] calls him monthly to pick up his medications.  Diabetes:  Checking BS 3 times daily. Today BS was 110 mg/dl. Lowest BS 73 mg/dl. Prescibed 110  units daily, however, he has been injecting 52 plus 52 units daily. Reviewed dose with patient, encouraged him to inject 54 plus 56 units = 110. Discussed using Tresiba 200 units/ml, patient was not sure which strength he was using (100 vs 200 units/ml). BS readings: 79-157 mg/dl (FBS) and 78 - 154 mg/dl at bedtime. Using [place] and Victoza; no short-acting insulin.   Currently on Tresiba, Victoza, Invokana. Also on an ACE inhibitor, aspirin and a statin. Metformin and Januvia discontinued during hospital admission in July 2018. Patient is concerned about using Invokana with the recent information on the news about risk for amputations. Could consider Jardiance or [place]. A1c = 7.5% (7/18). Received his flu shot this year.  Stroke/MI: Currently on prasugrel, aspirin, rosuvastatin, metoprolol, ramipril, isosorbide, ranolazine, amlodipine and furosemide. Experienced facial droop, seen at [facility] 02/05/17. Patient stated that he had not refilled some of his medications due to cost.  Arthritis Pain: Currently taking Celebrex daily. Also on prasugrel and aspirin. May need to consider alternate therapy and continue to closely monitor for s/sx of bleeding.  Gout: Last flare 2 weeks ago.  Using colchicine only as needed.  Outcomes: Patient is on prasugrel, aspirin and Celebrex; please monitor for s/sx of bleeding. Suggest Tresiba 200 units/ml if he is not already using; this will allow him to draw up 110 units in one dose. Patient interested in an alternative for Invokana; will message his provider.  Klea Nall K. Joelene Millin, PharmD [facility] Clinic-Pharmacy Operations Coordinator [phone]

## 2017-05-27 NOTE — Patient Instructions (Addendum)
Pharmacist to consult your physician about Derrick Barajas, may be able to switch to Jardiance  Will also recommend Tresiba 200 units/ml for one daily injection

## 2017-05-28 ENCOUNTER — Encounter: Payer: Self-pay | Admitting: Pharmacist

## 2017-06-10 ENCOUNTER — Encounter: Payer: Self-pay | Admitting: Podiatry

## 2017-06-10 ENCOUNTER — Ambulatory Visit: Payer: Medicaid Other | Admitting: Podiatry

## 2017-06-10 DIAGNOSIS — M79676 Pain in unspecified toe(s): Secondary | ICD-10-CM

## 2017-06-10 DIAGNOSIS — E0842 Diabetes mellitus due to underlying condition with diabetic polyneuropathy: Secondary | ICD-10-CM | POA: Diagnosis not present

## 2017-06-10 DIAGNOSIS — B351 Tinea unguium: Secondary | ICD-10-CM

## 2017-06-10 NOTE — Progress Notes (Signed)
Complaint:  Visit Type: Patient returns to my office for continued preventative foot care services. Complaint: Patient states" my nails have grown long and thick and become painful to walk and wear shoes" Patient has been diagnosed with DM with no foot complications. The patient presents for preventative foot care services. No changes to ROS  Podiatric Exam: Vascular: dorsalis pedis and posterior tibial pulses are palpable bilateral. Capillary return is immediate. Temperature gradient is WNL. Skin turgor WNL  Sensorium: Normal Semmes Weinstein monofilament test. Normal tactile sensation bilaterally. Nail Exam: Pt has thick disfigured discolored nails with subungual debris noted bilateral entire nail hallux through fifth toenails Ulcer Exam: There is no evidence of ulcer or pre-ulcerative changes or infection. Orthopedic Exam: Muscle tone and strength are WNL. No limitations in general ROM. No crepitus or effusions noted. Foot type and digits show no abnormalities. Bony prominences are unremarkable. Skin: No Porokeratosis. No infection or ulcers  Diagnosis:  Onychomycosis, , Pain in right toe, pain in left toes  Treatment & Plan Procedures and Treatment: Consent by patient was obtained for treatment procedures. The patient understood the discussion of treatment and procedures well. All questions were answered thoroughly reviewed. Debridement of mycotic and hypertrophic toenails, 1 through 5 bilateral and clearing of subungual debris. No ulceration, no infection noted.  Return Visit-Office Procedure: Patient instructed to return to the office for a follow up visit 3 months for continued evaluation and treatment.    Kamaria Lucia DPM 

## 2017-06-28 ENCOUNTER — Ambulatory Visit
Admission: RE | Admit: 2017-06-28 | Discharge: 2017-06-28 | Disposition: A | Discharge status: Home / Self Care | Payer: Medicaid Other | Source: Ambulatory Visit | Attending: Gastroenterology | Admitting: Gastroenterology

## 2017-06-28 ENCOUNTER — Encounter
Admission: RE | Disposition: A | Discharge status: Home / Self Care | Payer: Self-pay | Source: Ambulatory Visit | Attending: Gastroenterology

## 2017-06-28 DIAGNOSIS — Z538 Procedure and treatment not carried out for other reasons: Secondary | ICD-10-CM | POA: Diagnosis not present

## 2017-06-28 DIAGNOSIS — Z1211 Encounter for screening for malignant neoplasm of colon: Secondary | ICD-10-CM | POA: Insufficient documentation

## 2017-06-28 SURGERY — COLONOSCOPY WITH PROPOFOL
Anesthesia: General

## 2017-06-28 NOTE — H&P (Signed)
Patient presented for colonoscopy but did not do prep.  Patient sent home, will have office call him to arrange for later.  No iv placed.

## 2017-07-31 ENCOUNTER — Telehealth: Payer: Self-pay | Admitting: Pharmacist

## 2017-07-31 NOTE — Telephone Encounter (Signed)
Derrick Barajas called 07/30/17 to let me know he is going to stay on the Invokana 300 mg at bedtime. Initially he was concerned about the amputation risk warnings. We discussed alternatives to Invokana at his last visit. He states that his blood sugars are "really good" and he feels good and did not want to make any changes. He continues to fill all of his medications and makes all of his provider appointments.  Currently using [facility] and stated they call him when his refills are due.  Blood glucose ranges from > 100 to 200 mg/dl at bedtime, then his fasting blood glucose is typically less than 100 mg/dl. Also on Tresiba 200 units/ml- 110 units daily and Victoza 1.8mg  daily.  [name] K. Joelene Millin, PharmD [facility] Clinic-Pharmacy Operations Coordinator [phone]

## 2017-09-09 ENCOUNTER — Encounter: Payer: Medicaid Other | Admitting: Pharmacist

## 2017-09-09 ENCOUNTER — Ambulatory Visit: Payer: Medicaid Other | Admitting: Podiatry

## 2017-09-09 ENCOUNTER — Encounter: Payer: Self-pay | Admitting: Podiatry

## 2017-09-09 DIAGNOSIS — M79676 Pain in unspecified toe(s): Secondary | ICD-10-CM

## 2017-09-09 DIAGNOSIS — E0842 Diabetes mellitus due to underlying condition with diabetic polyneuropathy: Secondary | ICD-10-CM

## 2017-09-09 DIAGNOSIS — B351 Tinea unguium: Secondary | ICD-10-CM

## 2017-09-09 NOTE — Progress Notes (Signed)
Complaint:  Visit Type: Patient returns to my office for continued preventative foot care services. Complaint: Patient states" my nails have grown long and thick and become painful to walk and wear shoes" Patient has been diagnosed with DM with no foot complications. The patient presents for preventative foot care services. No changes to ROS  Podiatric Exam: Vascular: dorsalis pedis and posterior tibial pulses are palpable bilateral. Capillary return is immediate. Temperature gradient is WNL. Skin turgor WNL  Sensorium: Normal Semmes Weinstein monofilament test. Normal tactile sensation bilaterally. Nail Exam: Pt has thick disfigured discolored nails with subungual debris noted bilateral entire nail hallux through fifth toenails Ulcer Exam: There is no evidence of ulcer or pre-ulcerative changes or infection. Orthopedic Exam: Muscle tone and strength are WNL. No limitations in general ROM. No crepitus or effusions noted. Foot type and digits show no abnormalities. Bony prominences are unremarkable. Skin: No Porokeratosis. No infection or ulcers  Diagnosis:  Onychomycosis, , Pain in right toe, pain in left toes  Treatment & Plan Procedures and Treatment: Consent by patient was obtained for treatment procedures. The patient understood the discussion of treatment and procedures well. All questions were answered thoroughly reviewed. Debridement of mycotic and hypertrophic toenails, 1 through 5 bilateral and clearing of subungual debris. No ulceration, no infection noted.  Return Visit-Office Procedure: Patient instructed to return to the office for a follow up visit 3 months for continued evaluation and treatment.    Jasmynn Pfalzgraf DPM 

## 2017-10-14 ENCOUNTER — Encounter: Payer: Self-pay | Admitting: Pharmacist

## 2017-10-14 ENCOUNTER — Ambulatory Visit: Payer: Medicaid Other | Admitting: Pharmacist

## 2017-10-14 ENCOUNTER — Encounter (INDEPENDENT_AMBULATORY_CARE_PROVIDER_SITE_OTHER): Payer: Self-pay

## 2017-10-14 VITALS — BP 124/72 | Ht 67.0 in | Wt 286.0 lb

## 2017-10-14 DIAGNOSIS — Z79899 Other long term (current) drug therapy: Secondary | ICD-10-CM

## 2017-10-14 NOTE — Progress Notes (Addendum)
Medication Management Clinic Visit Note  Patient: Derrick Barajas MRN: [id number] Date of Birth: 02/18/1956 PCP: Margaretann Loveless, MD   Derrick Barajas 62 y.o. male presents for a medication therapy management visit today. He presents with his pill box and medications for review.   BP 124/72 (BP Location: Right Arm, Patient Position: Sitting, Cuff Size: Large)   Ht 5\' 7"  (1.702 m)   Wt 286 lb (129.7 kg)   BMI 44.79 kg/m   Patient Information   Past Medical History:  Diagnosis Date  . CAD (coronary artery disease)   . CHF (congestive heart failure) (HCC)   . Diabetes mellitus without complication (HCC)   . Gout   . Hyperlipidemia   . Hypertension   . MI (myocardial infarction) (HCC)   . RA (rheumatoid arthritis) (HCC)   . Sleep apnea   . 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 Thyroid disease       Past Surgical History:  Procedure Laterality Date  . CIRCUMCISION    . CORONARY ANGIOPLASTY WITH STENT PLACEMENT    . TONSILLECTOMY       Family History  Problem Relation Age of Onset  . Diabetes Mother   . Diabetes Brother     New Diagnoses (since last visit): NA  Family Support: Poor  Lifestyle Diet: Breakfast: eggs, [facility] bacon, pork sausage Lunch: fish, hamburger, subway salad  Dinner: fish, hamburger, subway salad Drinks: decaf coffee, unsweet tea, caffeine free diet soda Snacks: popcorn           Social History   Substance and Sexual Activity  Alcohol Use No  . Alcohol/week: 0.0 oz      Social History   Tobacco Use  Smoking Status Never Smoker  Smokeless Tobacco Never Used      Health Maintenance  Topic Date Due  . Hepatitis C Screening  1956-02-29  . PNEUMOCOCCAL POLYSACCHARIDE VACCINE (1) 01/12/1958  . FOOT EXAM  01/12/1966  . OPHTHALMOLOGY EXAM  01/12/1966  . HIV Screening  01/13/1971  . COLONOSCOPY  01/12/2006  . HEMOGLOBIN A1C  08/08/2017  . INFLUENZA VACCINE  02/06/2018  . TETANUS/TDAP  07/11/2023   Outpatient Encounter  Medications as of 10/14/2017  Medication Sig  . ACCU-CHEK AVIVA PLUS test strip TEST TID  . amLODipine (NORVASC) 10 MG tablet Take 10 mg by mouth every morning.   12/14/2017 aspirin 81 MG tablet Take 81 mg by mouth daily.  . B-D ULTRAFINE III SHORT PEN 31G X 8 MM MISC USE UTD BID  . celecoxib (CELEBREX) 200 MG capsule Take 200 mg by mouth daily.  . colchicine 0.6 MG tablet Take 0.6 mg by mouth as needed.  . docusate sodium (COLACE) 100 MG capsule Take 100 mg at bedtime by mouth.   . Dulaglutide (TRULICITY) 0.75 MG/0.5ML SOPN Inject 0.75 mg into the skin once a week.  . fluticasone (FLONASE) 50 MCG/ACT nasal spray Use 1 spray in each nostril daily  . furosemide (LASIX) 20 MG tablet Take 20 mg by mouth daily.  . Insulin Degludec (TRESIBA FLEXTOUCH) 200 UNIT/ML SOPN Inject 120 Units into the skin daily.  . INVOKANA 300 MG TABS tablet Take 1 tablet by mouth at bedtime.   . isosorbide mononitrate (IMDUR) 60 MG 24 hr tablet Take 60 mg daily by mouth.  . levothyroxine (SYNTHROID, LEVOTHROID) 125 MCG tablet Take 125 mcg by mouth daily before breakfast.   . metoprolol succinate (TOPROL-XL) 100 MG 24 hr tablet Take 100 mg daily by mouth.  Take with or immediately following a meal.  . olopatadine (PATANOL) 0.1 % ophthalmic solution Place 1 drop into both eyes daily.  . prasugrel (EFFIENT) 10 MG TABS Take 10 mg by mouth daily.   . ramipril (ALTACE) 10 MG capsule Take 10 mg by mouth every morning.   . ranolazine (RANEXA) 500 MG 12 hr tablet Take 500 mg by mouth 2 (two) times daily.  . rosuvastatin (CRESTOR) 20 MG tablet Take 20 mg by mouth daily.  . [DISCONTINUED] Aspirin-Calcium Carbonate (BAYER WOMENS) [phone] MG TABS Take by mouth.  . [DISCONTINUED] hydroxypropyl methylcellulose / hypromellose (ISOPTO TEARS / GONIOVISC) 2.5 % ophthalmic solution 1 drop as needed.  . [DISCONTINUED] insulin degludec (TRESIBA) 100 UNIT/ML SOPN FlexTouch Pen Inject 120 Units into the skin every morning.   . [DISCONTINUED] liraglutide  (VICTOZA) 18 MG/3ML SOPN Inject 1.8 mg into the skin every morning.  . [DISCONTINUED] metFORMIN (GLUCOPHAGE) 1000 MG tablet Take by mouth.  . [DISCONTINUED] nitroGLYCERIN (NITROSTAT) 0.4 MG SL tablet Place under the tongue.  . [DISCONTINUED] oxyCODONE-acetaminophen (PERCOCET/ROXICET) 5-325 MG tablet Take by mouth.  . [DISCONTINUED] polyethylene glycol-electrolytes (NULYTELY/GOLYTELY) 420 g solution Take as directed for colonic prep.  . [DISCONTINUED] quinapril (ACCUPRIL) 20 MG tablet Take 20 mg by mouth.  . [DISCONTINUED] sitaGLIPtin (JANUVIA) 100 MG tablet Take by mouth.   No facility-administered encounter medications on file as of 10/14/2017.     Assessment and Plan:  Medication Compliance: Patient appears to be very familiar with and compliant with taking medications as prescribed according to quantities remaining in pill bottles and last fill dates. He sates that he receives monthly reminder calls from [facility] when it is time to pick up his medications.   Diabetes: Patient currently taking Tresiba, Theodis Sato and Trulicity (started ~3 weeks ago), and is tolerating well. He reports checking his BG ~2-3 times per day and that his FBG is usually ~100 mg/dL, and his 2-hour PPG is usually ~200 mg/dL or less. He states that he received his annual flu shot this past fall, but is unsure if he has received the pneumonia shot.    Hypertension: Patient is currently taking amlodipine, furosemide, isosorbide and metoprolol, tolerating well. His blood pressure was at goal in the office today. He reports that he does not monitor his blood pressure at home.    Stroke/MI: Patient is currently taking ASA, prasugrel, ranexa and rosuvastatin, tolerating well.   Gout: Patient takes colchicine when needed, but has not had a gout flare in ~8 months. He is limiting his red meats in effort to avoid gout flares.   Arthritis Pain: Patient is currently taking celecoxib, tolerating well.   Hyothyroid: Patient is  currently taking levothyroxine, tolerating well, no recent labs available for review today.  Other: Patient reports his colonoscopy was canceled but has not been rescheduled, encouraged patient to ask about rescheduling this during his next PCP visit. Patient presented with an OTC bottle of docusate/sennosides and was unaware it contained a stimulant. Encouraged patient to only take the stool softener with stimulant as needed for constipation and to purchase a stool softener with docusate only for daily use.   RTC: 3-4 months, after PCP visit in July  Cori Razor, PharmD Candidate   Cosigned: Iona Beard. Joelene Millin, PharmD [facility] Clinic-Pharmacy Operations Coordinator [phone]

## 2017-10-14 NOTE — Patient Instructions (Signed)
Purchase stool softener Docusate Sodium 100 mg capsules Ask Dr. Ellsworth Lennox about rescheduling colonoscopy

## 2017-12-04 ENCOUNTER — Other Ambulatory Visit: Payer: Self-pay

## 2017-12-04 ENCOUNTER — Encounter: Payer: Self-pay | Admitting: Emergency Medicine

## 2017-12-04 DIAGNOSIS — Z7982 Long term (current) use of aspirin: Secondary | ICD-10-CM | POA: Insufficient documentation

## 2017-12-04 DIAGNOSIS — I2510 Atherosclerotic heart disease of native coronary artery without angina pectoris: Secondary | ICD-10-CM | POA: Diagnosis not present

## 2017-12-04 DIAGNOSIS — I509 Heart failure, unspecified: Secondary | ICD-10-CM | POA: Insufficient documentation

## 2017-12-04 DIAGNOSIS — E119 Type 2 diabetes mellitus without complications: Secondary | ICD-10-CM | POA: Insufficient documentation

## 2017-12-04 DIAGNOSIS — Z79899 Other long term (current) drug therapy: Secondary | ICD-10-CM | POA: Insufficient documentation

## 2017-12-04 DIAGNOSIS — Z794 Long term (current) use of insulin: Secondary | ICD-10-CM | POA: Insufficient documentation

## 2017-12-04 DIAGNOSIS — N481 Balanitis: Secondary | ICD-10-CM | POA: Insufficient documentation

## 2017-12-04 DIAGNOSIS — Z8673 Personal history of transient ischemic attack (TIA), and cerebral infarction without residual deficits: Secondary | ICD-10-CM | POA: Insufficient documentation

## 2017-12-04 DIAGNOSIS — R21 Rash and other nonspecific skin eruption: Secondary | ICD-10-CM | POA: Diagnosis present

## 2017-12-04 DIAGNOSIS — I110 Hypertensive heart disease with heart failure: Secondary | ICD-10-CM | POA: Insufficient documentation

## 2017-12-04 LAB — URINALYSIS, COMPLETE (UACMP) WITH MICROSCOPIC
BACTERIA UA: NONE SEEN
BILIRUBIN URINE: NEGATIVE
Glucose, UA: >=500 mg/dL — AB
Hgb urine dipstick: NEGATIVE
KETONES UR: NEGATIVE mg/dL
LEUKOCYTES UA: NEGATIVE
NITRITE: NEGATIVE
PH: 5 (ref 5.0–8.0)
Protein, ur: NEGATIVE mg/dL
SQUAMOUS EPITHELIAL / LPF: NONE SEEN (ref 0–5)
Specific Gravity, Urine: 1.025 (ref 1.005–1.030)

## 2017-12-04 NOTE — ED Triage Notes (Signed)
Patient coming into night for red and irritated genitals. Patient stated it started last Friday. Patient denies swelling, discharge and painful urination but states he feels like his skin is burning.

## 2017-12-05 ENCOUNTER — Emergency Department
Admission: EM | Admit: 2017-12-05 | Discharge: 2017-12-05 | Disposition: A | Discharge status: Home / Self Care | Payer: Medicaid Other | Attending: Emergency Medicine | Admitting: Emergency Medicine

## 2017-12-05 DIAGNOSIS — N481 Balanitis: Secondary | ICD-10-CM

## 2017-12-05 MED ORDER — CLOTRIMAZOLE 1 % EX OINT: TOPICAL_OINTMENT | CUTANEOUS | 0 refills | Status: DC

## 2017-12-05 MED ORDER — HYDROCORTISONE 1 % EX CREA: TOPICAL_CREAM | CUTANEOUS | 0 refills | Status: AC

## 2017-12-05 NOTE — ED Provider Notes (Signed)
[facility] Emergency Department Provider Note ____________________________________________   First MD Initiated Contact with Patient 12/05/17 0121     (approximate)  I have reviewed the triage vital signs and the nursing notes.   HISTORY  Chief Complaint No chief complaint on file.    HPI Derrick Barajas is a 62 y.o. male who presents with rash to the tip of his penis, gradual onset, occurring over about 5 days, and present constantly.  Patient denies any dysuria, testicular pain or swelling, or any other rashes or lesions.  He states that he is diabetic, and that he also urinates frequently needs to use depends and thinks it may be getting irritated from the urine.   Past Medical History:  Diagnosis Date  . CAD (coronary artery disease)   . CHF (congestive heart failure) (HCC)   . Diabetes mellitus without complication (HCC)   . Gout   . Hyperlipidemia   . Hypertension   . MI (myocardial infarction) (HCC)   . RA (rheumatoid arthritis) (HCC)   . Sleep apnea   . 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 Thyroid disease     Patient Active Problem List   Diagnosis Date Noted  . 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 Complete tear of right rotator cuff 03/27/2016  . Diabetes mellitus type 2, uncomplicated (HCC) 10/26/2015  . Hypertension 10/26/2015  . Myocardial infarction (HCC) 10/26/2015    Past Surgical History:  Procedure Laterality Date  . CIRCUMCISION    . CORONARY ANGIOPLASTY WITH STENT PLACEMENT    . TONSILLECTOMY      Prior to Admission medications   Medication Sig Start Date End Date Taking? Authorizing Provider  ACCU-CHEK AVIVA PLUS test strip TEST TID 02/02/17   [provider]  amLODipine (NORVASC) 10 MG tablet Take 10 mg by mouth every morning.     [provider]  aspirin 81 MG tablet Take 81 mg by mouth daily.    [provider]  B-D ULTRAFINE III SHORT PEN 31G X 8 MM MISC USE UTD BID 04/10/17   [provider]  celecoxib (CELEBREX) 200 MG capsule Take 200 mg by mouth daily.    [provider]  Clotrimazole 1 % OINT Apply topically affected area twice daily for 7 days 12/05/17   Dionne Bucy, MD  colchicine 0.6 MG tablet Take 0.6 mg by mouth as needed.    [provider]  docusate sodium (COLACE) 100 MG capsule Take 100 mg at bedtime by mouth.     [provider]  Dulaglutide (TRULICITY) 0.75 MG/0.5ML SOPN Inject 0.75 mg into the skin once a week.    Sherron Monday, MD  fluticasone [facility]) 50 MCG/ACT nasal spray Use 1 spray in each nostril daily 12/05/16   [provider]  furosemide (LASIX) 20 MG tablet Take 20 mg by mouth daily.    [provider]  hydrocortisone cream 1 % Apply topically to affected area twice daily for 7 days 12/12/17 12/19/17  Dionne Bucy, MD  Insulin Degludec (TRESIBA FLEXTOUCH) 200 UNIT/ML SOPN Inject 120 Units into the skin daily.    Sherron Monday, MD  INVOKANA 300 MG TABS tablet Take 1 tablet by mouth at bedtime.  10/07/15   [provider]  isosorbide mononitrate (IMDUR) 60 MG 24 hr tablet Take 60 mg daily by mouth.    Caroleen Hamman, FNP  levothyroxine (SYNTHROID, LEVOTHROID) 125 MCG tablet Take 125 mcg by mouth daily before breakfast.  09/26/15   [provider]  metoprolol succinate (TOPROL-XL) 100 MG 24 hr tablet Take 100 mg daily by mouth. Take with or immediately following a meal.    Caroleen Hamman, FNP  olopatadine (PATANOL) 0.1 % ophthalmic solution Place 1 drop into both eyes daily.    [provider]  prasugrel (EFFIENT) 10 MG TABS Take 10 mg by mouth daily.     [provider]  ramipril (ALTACE) 10 MG capsule Take 10 mg by mouth every morning.  09/14/15   [provider]  ranolazine (RANEXA) 500 MG 12 hr tablet Take 500 mg by mouth 2 (two) times daily.    [provider]  rosuvastatin (CRESTOR) 20 MG tablet Take 20 mg by mouth  daily. 09/01/15   [provider]    Allergies Shellfish-derived products; Sulfa antibiotics; Iodine; Atorvastatin; Iodinated diagnostic agents; and Shrimp [shellfish allergy]  Family History  Problem Relation Age of Onset  . Diabetes Mother   . Diabetes Brother     Social History Social History   Tobacco Use  . Smoking status: Never Smoker  . Smokeless tobacco: Never Used  Substance Use Topics  . Alcohol use: No    Alcohol/week: 0.0 oz  . Drug use: No    Review of Systems  Constitutional: No fever. Gastrointestinal: No abdominal pain. Genitourinary: Negative for dysuria, hematuria, or testicular pain.  No penile discharge..  Skin: Positive for penile rash.   ____________________________________________   PHYSICAL EXAM:  VITAL SIGNS: ED Triage Vitals  Enc Vitals Group     BP 12/04/17 2210 (!) 151/78     Pulse Rate 12/04/17 2210 81     Resp 12/04/17 2210 17     Temp 12/04/17 2210 98.6 F (37 C)     Temp src --      SpO2 12/04/17 2210 97 %     Weight 12/04/17 2211 280 lb (127 kg)     Height 12/04/17 2211 5\' 7"  (1.702 m)     Head Circumference --      Peak Flow --      Pain Score 12/04/17 2211 8     Pain Loc --      Pain Edu? --      Excl. in GC? --     Constitutional: Alert and oriented. Well appearing and in no acute distress. Eyes: Conjunctivae are normal.  Head: Atraumatic. Nose: No congestion/rhinnorhea. Mouth/Throat: Mucous membranes are moist.   Neck: Normal range of motion.  Cardiovascular:  Good peripheral circulation. Respiratory: Normal respiratory effort.  Gastrointestinal: No distention.  Genitourinary: Testes nontender with no swelling.  Penis appears normal except for small amount of erythema and irritated skin to the glans.  No discharge. Musculoskeletal:  Extremities warm and well perfused.  Neurologic:  Normal speech and language. No gross focal neurologic deficits are appreciated.  Skin:  Skin is warm and dry. No rash  noted. Psychiatric: Mood and affect are normal. Speech and behavior are normal.  ____________________________________________   LABS (all labs ordered are listed, but only abnormal results are displayed)  Labs Reviewed  URINALYSIS, COMPLETE (UACMP) WITH MICROSCOPIC - Abnormal; Notable for the following components:      Result Value   Color, Urine STRAW (*)    APPearance CLEAR (*)    Glucose, UA >=500 (*)    All other components within normal limits   ____________________________________________  EKG   ____________________________________________  RADIOLOGY    ____________________________________________   PROCEDURES  Procedure(s) performed: No  Procedures  Critical Care performed: No ____________________________________________   INITIAL IMPRESSION / ASSESSMENT AND PLAN / ED COURSE  Pertinent labs & imaging results that were available during my care of the patient were reviewed by me and considered in my medical decision making (see chart for details).  Penile irritation consistent with mild balanitis.  No lesions or evidence of STI.  No urinary symptoms.  Likely precipitated by irritation from urine since the patient uses diapers, versus fungal infection related to his diabetes.  Plan: Discharge with clotrimazole cream, as well as a prescription for hydrocortisone in case the clotrimazole does not work.  I also gave the patient urology follow-up.  Return precautions given, and he expresses understanding.      ____________________________________________   FINAL CLINICAL IMPRESSION(S) / ED DIAGNOSES  Final diagnoses:  Balanitis      NEW MEDICATIONS STARTED DURING THIS VISIT:  New Prescriptions   CLOTRIMAZOLE 1 % OINT    Apply topically affected area twice daily for 7 days   HYDROCORTISONE CREAM 1 %    Apply topically to affected area twice daily for 7 days     Note:  This document was prepared using Dragon voice recognition software and may  include unintentional dictation errors.    Dionne Bucy, MD 12/05/17 [phone]

## 2017-12-05 NOTE — Discharge Instructions (Addendum)
Apply the clotrimazole cream to the affected area twice daily for the next week.  If this does not work, then you can start the hydrocortisone cream and use that for a week.  Do not use both of the creams together.  We have given you follow-up information for a urologist; you can make an appointment there and follow-up if your symptoms do not resolve.  Return to the ER for any new or worsening pain, rash, swelling, or any other new or worsening symptoms that concern you.

## 2017-12-12 ENCOUNTER — Ambulatory Visit: Payer: Medicaid Other | Admitting: Podiatry

## 2017-12-12 ENCOUNTER — Encounter: Payer: Self-pay | Admitting: Podiatry

## 2017-12-12 DIAGNOSIS — B351 Tinea unguium: Secondary | ICD-10-CM | POA: Diagnosis not present

## 2017-12-12 DIAGNOSIS — M79676 Pain in unspecified toe(s): Secondary | ICD-10-CM | POA: Diagnosis not present

## 2017-12-12 DIAGNOSIS — E0842 Diabetes mellitus due to underlying condition with diabetic polyneuropathy: Secondary | ICD-10-CM

## 2017-12-12 NOTE — Progress Notes (Signed)
Complaint:  Visit Type: Patient returns to my office for continued preventative foot care services. Complaint: Patient states" my nails have grown long and thick and become painful to walk and wear shoes" Patient has been diagnosed with DM with no foot complications. The patient presents for preventative foot care services. No changes to ROS  Podiatric Exam: Vascular: dorsalis pedis and posterior tibial pulses are palpable bilateral. Capillary return is immediate. Temperature gradient is WNL. Skin turgor WNL  Sensorium: Normal Semmes Weinstein monofilament test. Normal tactile sensation bilaterally. Nail Exam: Pt has thick disfigured discolored nails with subungual debris noted bilateral entire nail hallux through fifth toenails Ulcer Exam: There is no evidence of ulcer or pre-ulcerative changes or infection. Orthopedic Exam: Muscle tone and strength are WNL. No limitations in general ROM. No crepitus or effusions noted. Foot type and digits show no abnormalities. Bony prominences are unremarkable. Skin: No Porokeratosis. No infection or ulcers  Diagnosis:  Onychomycosis, , Pain in right toe, pain in left toes  Treatment & Plan Procedures and Treatment: Consent by patient was obtained for treatment procedures. The patient understood the discussion of treatment and procedures well. All questions were answered thoroughly reviewed. Debridement of mycotic and hypertrophic toenails, 1 through 5 bilateral and clearing of subungual debris. No ulceration, no infection noted.  Return Visit-Office Procedure: Patient instructed to return to the office for a follow up visit 3 months for continued evaluation and treatment.    Miryah Ralls DPM 

## 2018-03-17 ENCOUNTER — Ambulatory Visit: Payer: Medicaid Other | Admitting: Podiatry

## 2018-03-17 ENCOUNTER — Encounter: Payer: Self-pay | Admitting: Podiatry

## 2018-03-17 DIAGNOSIS — B351 Tinea unguium: Secondary | ICD-10-CM | POA: Diagnosis not present

## 2018-03-17 DIAGNOSIS — M79676 Pain in unspecified toe(s): Secondary | ICD-10-CM | POA: Diagnosis not present

## 2018-03-17 DIAGNOSIS — E0842 Diabetes mellitus due to underlying condition with diabetic polyneuropathy: Secondary | ICD-10-CM

## 2018-03-17 NOTE — Progress Notes (Signed)
Complaint:  Visit Type: Patient returns to my office for continued preventative foot care services. Complaint: Patient states" my nails have grown long and thick and become painful to walk and wear shoes" Patient has been diagnosed with DM with no foot complications. The patient presents for preventative foot care services. No changes to ROS  Podiatric Exam: Vascular: dorsalis pedis and posterior tibial pulses are palpable bilateral. Capillary return is immediate. Temperature gradient is WNL. Skin turgor WNL  Sensorium: Normal Semmes Weinstein monofilament test. Normal tactile sensation bilaterally. Nail Exam: Pt has thick disfigured discolored nails with subungual debris noted bilateral entire nail hallux through fifth toenails Ulcer Exam: There is no evidence of ulcer or pre-ulcerative changes or infection. Orthopedic Exam: Muscle tone and strength are WNL. No limitations in general ROM. No crepitus or effusions noted. Foot type and digits show no abnormalities. Bony prominences are unremarkable. Skin: No Porokeratosis. No infection or ulcers  Diagnosis:  Onychomycosis, , Pain in right toe, pain in left toes  Treatment & Plan Procedures and Treatment: Consent by patient was obtained for treatment procedures. The patient understood the discussion of treatment and procedures well. All questions were answered thoroughly reviewed. Debridement of mycotic and hypertrophic toenails, 1 through 5 bilateral and clearing of subungual debris. No ulceration, no infection noted.  Return Visit-Office Procedure: Patient instructed to return to the office for a follow up visit 3 months for continued evaluation and treatment.    Romeka Scifres DPM 

## 2018-04-09 ENCOUNTER — Emergency Department
Admission: EM | Admit: 2018-04-09 | Discharge: 2018-04-09 | Disposition: A | Discharge status: Home / Self Care | Payer: Medicaid Other | Attending: Student in an Organized Health Care Education/Training Program | Admitting: Student in an Organized Health Care Education/Training Program

## 2018-04-09 ENCOUNTER — Other Ambulatory Visit: Payer: Self-pay

## 2018-04-09 DIAGNOSIS — Z794 Long term (current) use of insulin: Secondary | ICD-10-CM | POA: Insufficient documentation

## 2018-04-09 DIAGNOSIS — I252 Old myocardial infarction: Secondary | ICD-10-CM | POA: Diagnosis not present

## 2018-04-09 DIAGNOSIS — Y9389 Activity, other specified: Secondary | ICD-10-CM | POA: Insufficient documentation

## 2018-04-09 DIAGNOSIS — T161XXA Foreign body in right ear, initial encounter: Secondary | ICD-10-CM

## 2018-04-09 DIAGNOSIS — Z8673 Personal history of transient ischemic attack (TIA), and cerebral infarction without residual deficits: Secondary | ICD-10-CM | POA: Diagnosis not present

## 2018-04-09 DIAGNOSIS — E119 Type 2 diabetes mellitus without complications: Secondary | ICD-10-CM | POA: Diagnosis not present

## 2018-04-09 DIAGNOSIS — Z7982 Long term (current) use of aspirin: Secondary | ICD-10-CM | POA: Insufficient documentation

## 2018-04-09 DIAGNOSIS — Z955 Presence of coronary angioplasty implant and graft: Secondary | ICD-10-CM | POA: Insufficient documentation

## 2018-04-09 DIAGNOSIS — I509 Heart failure, unspecified: Secondary | ICD-10-CM | POA: Insufficient documentation

## 2018-04-09 DIAGNOSIS — X58XXXA Exposure to other specified factors, initial encounter: Secondary | ICD-10-CM | POA: Insufficient documentation

## 2018-04-09 DIAGNOSIS — Z79899 Other long term (current) drug therapy: Secondary | ICD-10-CM | POA: Insufficient documentation

## 2018-04-09 DIAGNOSIS — I2510 Atherosclerotic heart disease of native coronary artery without angina pectoris: Secondary | ICD-10-CM | POA: Insufficient documentation

## 2018-04-09 DIAGNOSIS — Y929 Unspecified place or not applicable: Secondary | ICD-10-CM | POA: Insufficient documentation

## 2018-04-09 DIAGNOSIS — Y998 Other external cause status: Secondary | ICD-10-CM | POA: Insufficient documentation

## 2018-04-09 DIAGNOSIS — I110 Hypertensive heart disease with heart failure: Secondary | ICD-10-CM | POA: Diagnosis not present

## 2018-04-09 NOTE — ED Triage Notes (Signed)
Tip of qtip in right ear since Sunday. Tenderness to ear noted.

## 2018-04-09 NOTE — ED Provider Notes (Signed)
[facility] Emergency Department Provider Note  ____________________________________________   First MD Initiated Contact with Patient 04/09/18 1805     (approximate)  I have reviewed the triage vital signs and the nursing notes.   HISTORY  Chief Complaint Foreign Body in Ear    HPI BACILIO RHYNER is a 62 y.o. male presents emergency department complaining of a Q-tip being stuck in his ear since Sunday.  He states his ear was itchy and so he stuck the Q-tip down and they are not broke off.  He states that just the soft cotton part not the stick.  He denies any ear pain.  He states he cannot hear well because Cotton is blocking his hearing.    Past Medical History:  Diagnosis Date  . CAD (coronary artery disease)   . CHF (congestive heart failure) (HCC)   . Diabetes mellitus without complication (HCC)   . Gout   . Hyperlipidemia   . Hypertension   . MI (myocardial infarction) (HCC)   . RA (rheumatoid arthritis) (HCC)   . Sleep apnea   . 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 Thyroid disease     Patient Active Problem List   Diagnosis Date Noted  . 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 Complete tear of right rotator cuff 03/27/2016  . Diabetes mellitus type 2, uncomplicated (HCC) 10/26/2015  . Hypertension 10/26/2015  . Myocardial infarction (HCC) 10/26/2015    Past Surgical History:  Procedure Laterality Date  . CIRCUMCISION    . CORONARY ANGIOPLASTY WITH STENT PLACEMENT    . TONSILLECTOMY      Prior to Admission medications   Medication Sig Start Date End Date Taking? Authorizing Provider  ACCU-CHEK AVIVA PLUS test strip TEST TID 02/02/17   [provider]  amLODipine (NORVASC) 10 MG tablet Take 10 mg by mouth every morning.     [provider]  aspirin 81 MG tablet Take 81 mg by mouth daily.    [provider]  B-D ULTRAFINE III SHORT PEN 31G X 8 MM MISC USE UTD BID 04/10/17   [provider]  celecoxib  (CELEBREX) 200 MG capsule Take 200 mg by mouth daily.    [provider]  Clotrimazole 1 % OINT Apply topically affected area twice daily for 7 days 12/05/17   Dionne Bucy, MD  colchicine 0.6 MG tablet Take 0.6 mg by mouth as needed.    [provider]  docusate sodium (COLACE) 100 MG capsule Take 100 mg at bedtime by mouth.     [provider]  fluticasone (FLONASE) 50 MCG/ACT nasal spray Use 1 spray in each nostril daily 12/05/16   [provider]  furosemide (LASIX) 20 MG tablet Take 20 mg by mouth daily.    [provider]  Insulin Degludec (TRESIBA FLEXTOUCH) 200 UNIT/ML SOPN Inject 120 Units into the skin daily.    Sherron Monday, MD  INVOKANA 300 MG TABS tablet Take 1 tablet by mouth at bedtime.  10/07/15   [provider]  isosorbide mononitrate (IMDUR) 60 MG 24 hr tablet Take 60 mg daily by mouth.    Caroleen Hamman, FNP  LEVEMIR FLEXTOUCH 100 UNIT/ML Pen INJECT 90 UNITS QAM AND 85 UNITS QPM BEFORE MEALS 09/05/17   [provider]  levothyroxine (SYNTHROID, LEVOTHROID) 125 MCG tablet Take 125 mcg by mouth daily before breakfast.  09/26/15   [provider]  metoprolol succinate (TOPROL-XL) 100 MG 24 hr tablet Take 100  mg daily by mouth. Take with or immediately following a meal.    Caroleen Hamman, FNP  olopatadine (PATANOL) 0.1 % ophthalmic solution Place 1 drop into both eyes daily.    [provider]  prasugrel (EFFIENT) 10 MG TABS Take 10 mg by mouth daily.     [provider]  ramipril (ALTACE) 10 MG capsule Take 10 mg by mouth every morning.  09/14/15   [provider]  ranolazine (RANEXA) 500 MG 12 hr tablet Take 500 mg by mouth 2 (two) times daily.    [provider]  rosuvastatin (CRESTOR) 40 MG tablet TK 1 T PO QD 11/13/17   [provider]  TRULICITY 1.5 MG/0.5ML SOPN  11/30/17   [provider]    Allergies Shellfish-derived products; Sulfa  antibiotics; Iodine; Atorvastatin; Iodinated diagnostic agents; and Shrimp [shellfish allergy]  Family History  Problem Relation Age of Onset  . Diabetes Mother   . Diabetes Brother     Social History Social History   Tobacco Use  . Smoking status: Never Smoker  . Smokeless tobacco: Never Used  Substance Use Topics  . Alcohol use: No    Alcohol/week: 0.0 standard drinks  . Drug use: No    Review of Systems  Constitutional: No fever/chills Eyes: No visual changes. ENT: No sore throat.  Positive for Q-tip in the right ear Respiratory: Denies cough Genitourinary: Negative for dysuria. Musculoskeletal: Negative for back pain. Skin: Negative for rash.    ____________________________________________   PHYSICAL EXAM:  VITAL SIGNS: ED Triage Vitals  Enc Vitals Group     BP 04/09/18 1751 (!) 158/93     Pulse Rate 04/09/18 1751 88     Resp 04/09/18 1751 18     Temp 04/09/18 1751 98.6 F (37 C)     Temp Source 04/09/18 1751 Oral     SpO2 04/09/18 1751 97 %     Weight 04/09/18 1751 275 lb (124.7 kg)     Height 04/09/18 1751 5\' 7"  (1.702 m)     Head Circumference --      Peak Flow --      Pain Score 04/09/18 1757 8     Pain Loc --      Pain Edu? --      Excl. in GC? --     Constitutional: Alert and oriented. Well appearing and in no acute distress. Eyes: Conjunctivae are normal.  Head: Atraumatic. Ears: The right ear canal + for cotton of q tip in canal Nose: No congestion/rhinnorhea. Mouth/Throat: Mucous membranes are moist.   Neck:  supple no lymphadenopathy noted Cardiovascular: Normal rate, regular rhythm.  Respiratory: Normal respiratory effort.  No retractions,  GU: deferred Musculoskeletal: FROM all extremities, warm and well perfused Neurologic:  Normal speech and language.  Skin:  Skin is warm, dry and intact. No rash noted. Psychiatric: Mood and affect are normal. Speech and behavior are normal.  ____________________________________________     LABS (all labs ordered are listed, but only abnormal results are displayed)  Labs Reviewed - No data to display ____________________________________________   ____________________________________________  RADIOLOGY    ____________________________________________   PROCEDURES  Procedure(s) performed: FB was removed from the ear with suction and alligator clamp, pt tolerated the procedure well. Tm is intact and ear canal has no abrasions after removal.  Procedures    ____________________________________________   INITIAL IMPRESSION / ASSESSMENT AND PLAN / ED COURSE  Pertinent labs & imaging results that were available during my care of the patient  were reviewed by me and considered in my medical decision making (see chart for details).   Pt presents to the ER for FB in right ear  R ear has q tip in canal  Qtip removed with suction and alligator clamp, pt tolerated the procedure well   Patient was instructed to not put anything else in his ear.  He states he will never put anything else in his ear.  He was discharged in stable condition.  As part of my medical decision making, I reviewed the following data within the electronic MEDICAL RECORD NUMBER Nursing notes reviewed and incorporated, Notes from prior ED visits and [place] Controlled Substance Database  ____________________________________________   FINAL CLINICAL IMPRESSION(S) / ED DIAGNOSES  Final diagnoses:  Ear foreign body, right, initial encounter      NEW MEDICATIONS STARTED DURING THIS VISIT:  New Prescriptions   No medications on file     Note:  This document was prepared using Dragon voice recognition software and may include unintentional dictation errors.    Faythe Ghee, PA-C 04/09/18 Ether Griffins, MD 04/09/18 2022

## 2018-04-09 NOTE — Discharge Instructions (Addendum)
Follow with regular doctor as needed.  Do not put anything else in your ears.

## 2018-04-09 NOTE — ED Notes (Signed)
Pt alert and oriented X4, active, cooperative, pt in NAD. RR even and unlabored, color WNL.  Pt informed to return if any life threatening symptoms occur.  Discharge and followup instructions reviewed.  

## 2018-04-11 ENCOUNTER — Emergency Department
Admission: EM | Admit: 2018-04-11 | Discharge: 2018-04-11 | Disposition: A | Discharge status: Home / Self Care | Payer: Medicaid Other | Attending: Emergency Medicine | Admitting: Emergency Medicine

## 2018-04-11 ENCOUNTER — Other Ambulatory Visit: Payer: Self-pay

## 2018-04-11 ENCOUNTER — Encounter: Payer: Self-pay | Admitting: Emergency Medicine

## 2018-04-11 DIAGNOSIS — Z79899 Other long term (current) drug therapy: Secondary | ICD-10-CM | POA: Insufficient documentation

## 2018-04-11 DIAGNOSIS — I259 Chronic ischemic heart disease, unspecified: Secondary | ICD-10-CM | POA: Diagnosis not present

## 2018-04-11 DIAGNOSIS — L731 Pseudofolliculitis barbae: Secondary | ICD-10-CM | POA: Diagnosis not present

## 2018-04-11 DIAGNOSIS — L738 Other specified follicular disorders: Secondary | ICD-10-CM

## 2018-04-11 DIAGNOSIS — Z794 Long term (current) use of insulin: Secondary | ICD-10-CM | POA: Diagnosis not present

## 2018-04-11 DIAGNOSIS — I110 Hypertensive heart disease with heart failure: Secondary | ICD-10-CM | POA: Diagnosis not present

## 2018-04-11 DIAGNOSIS — E119 Type 2 diabetes mellitus without complications: Secondary | ICD-10-CM | POA: Insufficient documentation

## 2018-04-11 DIAGNOSIS — Z7982 Long term (current) use of aspirin: Secondary | ICD-10-CM | POA: Insufficient documentation

## 2018-04-11 DIAGNOSIS — R51 Headache: Secondary | ICD-10-CM | POA: Diagnosis present

## 2018-04-11 DIAGNOSIS — I509 Heart failure, unspecified: Secondary | ICD-10-CM | POA: Insufficient documentation

## 2018-04-11 MED ORDER — OXYCODONE-ACETAMINOPHEN 5-325 MG PO TABS: 1.0000 | ORAL_TABLET | Freq: Four times a day (QID) | ORAL | 0 refills | Status: DC | PRN

## 2018-04-11 MED ORDER — IBUPROFEN 600 MG PO TABS: 600.0000 mg | ORAL_TABLET | Freq: Three times a day (TID) | ORAL | 0 refills | Status: DC | PRN

## 2018-04-11 MED ORDER — CLINDAMYCIN HCL 300 MG PO CAPS: 300.0000 mg | ORAL_CAPSULE | Freq: Three times a day (TID) | ORAL | 0 refills | Status: AC

## 2018-04-11 NOTE — ED Notes (Signed)
See triage note  Presents with pain to right side of face  No teeth to that side  Denies any fever or trauma

## 2018-04-11 NOTE — ED Triage Notes (Signed)
Pt arrived via POV with reports for right side mouth pain. Pt states he doesn't have any teeth there.  Pt states he had a qtip removed from this right ear on Wednesday and states the pain started on Thursday.

## 2018-04-11 NOTE — Discharge Instructions (Addendum)
Follow discharge care instruction apply warm compresses for 5 minutes to affected area 3 times a day for 3 days.  Do not try to ruptured any lesions.

## 2018-04-11 NOTE — ED Provider Notes (Signed)
[facility] Emergency Department Provider Note   ____________________________________________   First MD Initiated Contact with Patient 04/11/18 1644     (approximate)  I have reviewed the triage vital signs and the nursing notes.   HISTORY  Chief Complaint Facial Pain (Right side of mouth)    HPI Derrick Barajas is a 62 y.o. male patient presents with right inferior facial pain and swelling.  Onset of complaint yesterday.  Patient was seen previously 2 days ago at this facility secondary to embedded Q-tip in the right ear.  Q-tip was removed with suction and forceps.  Patient denies dental pain associated with this complaint.  Patient rates pain as a 6/10.  Patient described the pain is "aching".  No palliative measure for complaint.  Past Medical History:  Diagnosis Date  . CAD (coronary artery disease)   . CHF (congestive heart failure) (HCC)   . Diabetes mellitus without complication (HCC)   . Gout   . Hyperlipidemia   . Hypertension   . MI (myocardial infarction) (HCC)   . RA (rheumatoid arthritis) (HCC)   . Sleep apnea   . 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 Thyroid disease     Patient Active Problem List   Diagnosis Date Noted  . 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 Complete tear of right rotator cuff 03/27/2016  . Diabetes mellitus type 2, uncomplicated (HCC) 10/26/2015  . Hypertension 10/26/2015  . Myocardial infarction (HCC) 10/26/2015    Past Surgical History:  Procedure Laterality Date  . CIRCUMCISION    . CORONARY ANGIOPLASTY WITH STENT PLACEMENT    . TONSILLECTOMY      Prior to Admission medications   Medication Sig Start Date End Date Taking? Authorizing Provider  ACCU-CHEK AVIVA PLUS test strip TEST TID 02/02/17   [provider]  amLODipine (NORVASC) 10 MG tablet Take 10 mg by mouth every morning.     [provider]  aspirin 81 MG tablet Take 81 mg by mouth daily.    [provider]  B-D ULTRAFINE III  SHORT PEN 31G X 8 MM MISC USE UTD BID 04/10/17   [provider]  celecoxib (CELEBREX) 200 MG capsule Take 200 mg by mouth daily.    [provider]  clindamycin (CLEOCIN) 300 MG capsule Take 1 capsule (300 mg total) by mouth 3 (three) times daily for 10 days. 04/11/18 04/21/18  Joni Reining, PA-C  Clotrimazole 1 % OINT Apply topically affected area twice daily for 7 days 12/05/17   Dionne Bucy, MD  colchicine 0.6 MG tablet Take 0.6 mg by mouth as needed.    [provider]  docusate sodium (COLACE) 100 MG capsule Take 100 mg at bedtime by mouth.     [provider]  fluticasone (FLONASE) 50 MCG/ACT nasal spray Use 1 spray in each nostril daily 12/05/16   [provider]  furosemide (LASIX) 20 MG tablet Take 20 mg by mouth daily.    [provider]  ibuprofen (ADVIL,MOTRIN) 600 MG tablet Take 1 tablet (600 mg total) by mouth every 8 (eight) hours as needed. 04/11/18   Joni Reining, PA-C  Insulin Degludec (TRESIBA FLEXTOUCH) 200 UNIT/ML SOPN Inject 120 Units into the skin daily.    Sherron Monday, MD  INVOKANA 300 MG TABS tablet Take 1 tablet by mouth at bedtime.  10/07/15   [provider]  isosorbide mononitrate (IMDUR) 60 MG 24 hr tablet Take 60 mg daily by mouth.  Caroleen Hamman, FNP  LEVEMIR FLEXTOUCH 100 UNIT/ML Pen INJECT 90 UNITS QAM AND 85 UNITS QPM BEFORE MEALS 09/05/17   [provider]  levothyroxine (SYNTHROID, LEVOTHROID) 125 MCG tablet Take 125 mcg by mouth daily before breakfast.  09/26/15   [provider]  metoprolol succinate (TOPROL-XL) 100 MG 24 hr tablet Take 100 mg daily by mouth. Take with or immediately following a meal.    Caroleen Hamman, FNP  olopatadine (PATANOL) 0.1 % ophthalmic solution Place 1 drop into both eyes daily.    [provider]  oxyCODONE-acetaminophen (PERCOCET) 5-325 MG tablet Take 1 tablet by mouth every 6 (six) hours as needed for severe pain.  04/11/18 04/11/19  Joni Reining, PA-C  prasugrel (EFFIENT) 10 MG TABS Take 10 mg by mouth daily.     [provider]  ramipril (ALTACE) 10 MG capsule Take 10 mg by mouth every morning.  09/14/15   [provider]  ranolazine (RANEXA) 500 MG 12 hr tablet Take 500 mg by mouth 2 (two) times daily.    [provider]  rosuvastatin (CRESTOR) 40 MG tablet TK 1 T PO QD 11/13/17   [provider]  TRULICITY 1.5 MG/0.5ML SOPN  11/30/17   [provider]    Allergies Shellfish-derived products; Sulfa antibiotics; Iodine; Atorvastatin; Iodinated diagnostic agents; and Shrimp [shellfish allergy]  Family History  Problem Relation Age of Onset  . Diabetes Mother   . Diabetes Brother     Social History Social History   Tobacco Use  . Smoking status: Never Smoker  . Smokeless tobacco: Never Used  Substance Use Topics  . Alcohol use: No    Alcohol/week: 0.0 standard drinks  . Drug use: No    Review of Systems  Constitutional: No fever/chills Eyes: No visual changes. ENT: No sore throat. Cardiovascular: Denies chest pain. Respiratory: Denies shortness of breath. Gastrointestinal: No abdominal pain.  No nausea, no vomiting.  No diarrhea.  No constipation. Genitourinary: Negative for dysuria. Musculoskeletal: Negative for back pain. Skin: Negative for rash. Neurological: Negative for headaches, focal weakness or numbness. Endocrine:Diabetes, gout, hyperlipidemia, hypertension, and hypothyroidism. Allergic/Immunilogical: See medication list. ____________________________________________   PHYSICAL EXAM:  VITAL SIGNS: ED Triage Vitals  Enc Vitals Group     BP 04/11/18 1637 135/82     Pulse Rate 04/11/18 1637 95     Resp --      Temp 04/11/18 1637 97.6 F (36.4 C)     Temp Source 04/11/18 1637 Oral     SpO2 04/11/18 1637 97 %     Weight 04/11/18 1639 275 lb (124.7 kg)     Height 04/11/18 1639 5\' 7"  (1.702 m)     Head Circumference --       Peak Flow --      Pain Score 04/11/18 1638 6     Pain Loc --      Pain Edu? --      Excl. in GC? --    Constitutional: Alert and oriented. Well appearing and in no acute distress. Hematological/Lymphatic/Immunilogical: No cervical lymphadenopathy. Cardiovascular: Normal rate, regular rhythm. Grossly normal heart sounds.  Good peripheral circulation. Respiratory: Normal respiratory effort.  No retractions. Lungs CTAB. Skin: Skin around the inferior anterior mandible is edematous and erythematous.  Discrete pustular lesions secondary to ingrown hair follicles.   Psychiatric: Mood and affect are normal. Speech and behavior are normal.  ____________________________________________   LABS (all labs ordered are listed, but only abnormal results are displayed)  Labs Reviewed -  No data to display ____________________________________________  EKG   ____________________________________________  RADIOLOGY  ED MD interpretation:    Official radiology report(s): No results found.  ____________________________________________   PROCEDURES  Procedure(s) performed: None  Procedures  Critical Care performed: No  ____________________________________________   INITIAL IMPRESSION / ASSESSMENT AND PLAN / ED COURSE  As part of my medical decision making, I reviewed the following data within the electronic MEDICAL RECORD NUMBER    Right inferior anterior facial pain and edema secondary to folliculitis.  Patient given discharge care instructions.  Patient given a prescription for clindamycin, Percocet as, and ibuprofen.  Patient advised follow-up PCP if there is no improvement in 1 week.      ____________________________________________   FINAL CLINICAL IMPRESSION(S) / ED DIAGNOSES  Final diagnoses:  Folliculitis barbae     ED Discharge Orders         Ordered    clindamycin (CLEOCIN) 300 MG capsule  3 times daily     04/11/18 1658    oxyCODONE-acetaminophen (PERCOCET) 5-325  MG tablet  Every 6 hours PRN     04/11/18 1658    ibuprofen (ADVIL,MOTRIN) 600 MG tablet  Every 8 hours PRN     04/11/18 1658           Note:  This document was prepared using Dragon voice recognition software and may include unintentional dictation errors.    Joni Reining, PA-C 04/11/18 1706    Nita Sickle, MD 04/12/18 [id number]

## 2018-06-16 ENCOUNTER — Ambulatory Visit: Payer: Medicaid Other | Admitting: Podiatry

## 2018-06-16 ENCOUNTER — Encounter: Payer: Self-pay | Admitting: Podiatry

## 2018-06-16 DIAGNOSIS — E0842 Diabetes mellitus due to underlying condition with diabetic polyneuropathy: Secondary | ICD-10-CM

## 2018-06-16 DIAGNOSIS — B351 Tinea unguium: Secondary | ICD-10-CM | POA: Diagnosis not present

## 2018-06-16 DIAGNOSIS — M79675 Pain in left toe(s): Secondary | ICD-10-CM

## 2018-06-16 DIAGNOSIS — M79674 Pain in right toe(s): Secondary | ICD-10-CM | POA: Diagnosis not present

## 2018-06-16 DIAGNOSIS — M79676 Pain in unspecified toe(s): Principal | ICD-10-CM

## 2018-06-16 NOTE — Progress Notes (Signed)
Complaint:  Visit Type: Patient returns to my office for continued preventative foot care services. Complaint: Patient states" my nails have grown long and thick and become painful to walk and wear shoes" Patient has been diagnosed with DM with no foot complications. The patient presents for preventative foot care services. No changes to ROS  Podiatric Exam: Vascular: dorsalis pedis and posterior tibial pulses are palpable bilateral. Capillary return is immediate. Temperature gradient is WNL. Skin turgor WNL  Sensorium: Normal Semmes Weinstein monofilament test. Normal tactile sensation bilaterally. Nail Exam: Pt has thick disfigured discolored nails with subungual debris noted bilateral entire nail hallux through fifth toenails Ulcer Exam: There is no evidence of ulcer or pre-ulcerative changes or infection. Orthopedic Exam: Muscle tone and strength are WNL. No limitations in general ROM. No crepitus or effusions noted. Foot type and digits show no abnormalities. Bony prominences are unremarkable. Skin: No Porokeratosis. No infection or ulcers  Diagnosis:  Onychomycosis, , Pain in right toe, pain in left toes  Treatment & Plan Procedures and Treatment: Consent by patient was obtained for treatment procedures. The patient understood the discussion of treatment and procedures well. All questions were answered thoroughly reviewed. Debridement of mycotic and hypertrophic toenails, 1 through 5 bilateral and clearing of subungual debris. No ulceration, no infection noted. ABN signed in 2019. Return Visit-Office Procedure: Patient instructed to return to the office for a follow up visit 3 months for continued evaluation and treatment.    Helane Gunther DPM

## 2018-06-26 ENCOUNTER — Ambulatory Visit: Admit: 2018-06-26 | Payer: Medicaid Other | Admitting: Gastroenterology

## 2018-06-26 SURGERY — COLONOSCOPY WITH PROPOFOL
Anesthesia: General

## 2018-08-19 DIAGNOSIS — E782 Mixed hyperlipidemia: Secondary | ICD-10-CM | POA: Insufficient documentation

## 2018-08-19 DIAGNOSIS — E063 Autoimmune thyroiditis: Secondary | ICD-10-CM | POA: Insufficient documentation

## 2018-09-11 ENCOUNTER — Emergency Department
Admission: EM | Admit: 2018-09-11 | Discharge: 2018-09-11 | Disposition: A | Discharge status: Home / Self Care | Payer: Medicaid Other | Attending: Emergency Medicine | Admitting: Emergency Medicine

## 2018-09-11 ENCOUNTER — Encounter: Payer: Self-pay | Admitting: Emergency Medicine

## 2018-09-11 ENCOUNTER — Other Ambulatory Visit: Payer: Self-pay

## 2018-09-11 ENCOUNTER — Emergency Department: Payer: Medicaid Other

## 2018-09-11 DIAGNOSIS — Z79899 Other long term (current) drug therapy: Secondary | ICD-10-CM | POA: Insufficient documentation

## 2018-09-11 DIAGNOSIS — Z794 Long term (current) use of insulin: Secondary | ICD-10-CM | POA: Insufficient documentation

## 2018-09-11 DIAGNOSIS — I110 Hypertensive heart disease with heart failure: Secondary | ICD-10-CM | POA: Insufficient documentation

## 2018-09-11 DIAGNOSIS — I509 Heart failure, unspecified: Secondary | ICD-10-CM | POA: Diagnosis not present

## 2018-09-11 DIAGNOSIS — E119 Type 2 diabetes mellitus without complications: Secondary | ICD-10-CM | POA: Insufficient documentation

## 2018-09-11 DIAGNOSIS — R079 Chest pain, unspecified: Secondary | ICD-10-CM | POA: Insufficient documentation

## 2018-09-11 DIAGNOSIS — I2510 Atherosclerotic heart disease of native coronary artery without angina pectoris: Secondary | ICD-10-CM | POA: Diagnosis not present

## 2018-09-11 DIAGNOSIS — Z7982 Long term (current) use of aspirin: Secondary | ICD-10-CM | POA: Insufficient documentation

## 2018-09-11 LAB — BASIC METABOLIC PANEL
ANION GAP: 8 (ref 5–15)
BUN: 15 mg/dL (ref 8–23)
CALCIUM: 9 mg/dL (ref 8.9–10.3)
CHLORIDE: 105 mmol/L (ref 98–111)
CO2: 26 mmol/L (ref 22–32)
CREATININE: 1.17 mg/dL (ref 0.61–1.24)
GFR calc non Af Amer: >60 mL/min (ref >60)
Glucose, Bld: 217 mg/dL — ABNORMAL HIGH (ref 70–99)
Potassium: 4 mmol/L (ref 3.5–5.1)
SODIUM: 139 mmol/L (ref 135–145)

## 2018-09-11 LAB — CBC
HCT: 44.1 % (ref 39.0–52.0)
Hemoglobin: 14.4 g/dL (ref 13.0–17.0)
MCH: 28.7 pg (ref 26.0–34.0)
MCHC: 32.7 g/dL (ref 30.0–36.0)
MCV: 87.8 fL (ref 80.0–100.0)
NRBC: 0 % (ref 0.0–0.2)
PLATELETS: 231 10*3/uL (ref 150–400)
RBC: 5.02 MIL/uL (ref 4.22–5.81)
RDW: 13 % (ref 11.5–15.5)
WBC: 6.2 10*3/uL (ref 4.0–10.5)

## 2018-09-11 LAB — TROPONIN I: Troponin I: <0.03 ng/mL (ref <0.03)

## 2018-09-11 MED ORDER — SODIUM CHLORIDE 0.9% FLUSH: 3.0000 mL | Freq: Once | INTRAVENOUS | Status: DC

## 2018-09-11 NOTE — ED Provider Notes (Signed)
[facility] Emergency Department Provider Note   ____________________________________________    I have reviewed the triage vital signs and the nursing notes.   HISTORY  Chief Complaint Chest pain    HPI Derrick Barajas is a 63 y.o. male with a history of CAD CHF and diabetes who presents with complaints of chest pain that occurred while he was running away from a dog that was trying to attack him.  He reports since he has been in the emergency department he has been chest pain-free.  He describes the chest pain is tight in nature.  Denies shortness of breath.  No injuries reported.  Currently feels quite well.  Reports a history of heart attack in the past  Past Medical History:  Diagnosis Date  . CAD (coronary artery disease)   . CHF (congestive heart failure) (HCC)   . Diabetes mellitus without complication (HCC)   . Gout   . Hyperlipidemia   . Hypertension   . MI (myocardial infarction) (HCC)   . RA (rheumatoid arthritis) (HCC)   . Sleep apnea   . 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 Thyroid disease     Patient Active Problem List   Diagnosis Date Noted  . 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 Complete tear of right rotator cuff 03/27/2016  . Diabetes mellitus type 2, uncomplicated (HCC) 10/26/2015  . Hypertension 10/26/2015  . Myocardial infarction (HCC) 10/26/2015    Past Surgical History:  Procedure Laterality Date  . CIRCUMCISION    . CORONARY ANGIOPLASTY WITH STENT PLACEMENT    . TONSILLECTOMY      Prior to Admission medications   Medication Sig Start Date End Date Taking? Authorizing Provider  ACCU-CHEK AVIVA PLUS test strip TEST TID 02/02/17   [provider]  amLODipine (NORVASC) 10 MG tablet Take 10 mg by mouth every morning.     [provider]  aspirin 81 MG tablet Take 81 mg by mouth daily.    [provider]  B-D ULTRAFINE III SHORT PEN 31G X 8 MM MISC USE UTD BID 04/10/17   [provider]    celecoxib (CELEBREX) 200 MG capsule Take 200 mg by mouth daily.    [provider]  Clotrimazole 1 % OINT Apply topically affected area twice daily for 7 days 12/05/17   Dionne Bucy, MD  colchicine 0.6 MG tablet Take 0.6 mg by mouth as needed.    [provider]  docusate sodium (COLACE) 100 MG capsule Take 100 mg at bedtime by mouth.     [provider]  fluticasone (FLONASE) 50 MCG/ACT nasal spray Use 1 spray in each nostril daily 12/05/16   [provider]  furosemide (LASIX) 20 MG tablet Take 20 mg by mouth daily.    [provider]  ibuprofen (ADVIL,MOTRIN) 600 MG tablet Take 1 tablet (600 mg total) by mouth every 8 (eight) hours as needed. 04/11/18   Joni Reining, PA-C  Insulin Degludec (TRESIBA FLEXTOUCH) 200 UNIT/ML SOPN Inject 120 Units into the skin daily.    Sherron Monday, MD  INVOKANA 300 MG TABS tablet Take 1 tablet by mouth at bedtime.  10/07/15   [provider]  isosorbide mononitrate (IMDUR) 60 MG 24 hr tablet Take 60 mg daily by mouth.    Caroleen Hamman, FNP  LEVEMIR FLEXTOUCH 100 UNIT/ML Pen INJECT 90 UNITS QAM AND 85 UNITS QPM BEFORE MEALS 09/05/17   [provider]  levothyroxine (SYNTHROID, LEVOTHROID)  125 MCG tablet Take 125 mcg by mouth daily before breakfast.  09/26/15   [provider]  metoprolol succinate (TOPROL-XL) 100 MG 24 hr tablet Take 100 mg daily by mouth. Take with or immediately following a meal.    Caroleen Hammanunningham, Kristin, FNP  olopatadine (PATANOL) 0.1 % ophthalmic solution Place 1 drop into both eyes daily.    [provider]  oxyCODONE-acetaminophen (PERCOCET) 5-325 MG tablet Take 1 tablet by mouth every 6 (six) hours as needed for severe pain. 04/11/18 04/11/19  Joni ReiningSmith, Ronald K, PA-C  prasugrel (EFFIENT) 10 MG TABS Take 10 mg by mouth daily.     [provider]  ramipril (ALTACE) 10 MG capsule Take 10 mg by mouth every morning.  09/14/15   [provider]  ranolazine (RANEXA) 500 MG 12 hr tablet Take 500 mg by mouth 2 (two) times daily.    [provider]  rosuvastatin (CRESTOR) 40 MG tablet TK 1 T PO QD 11/13/17   [provider]  TRULICITY 1.5 MG/0.5ML SOPN  11/30/17   [provider]     Allergies Shellfish-derived products; Sulfa antibiotics; Iodine; Atorvastatin; Iodinated diagnostic agents; Nsaids; and Shrimp [shellfish allergy]  Family History  Problem Relation Age of Onset  . Diabetes Mother   . Diabetes Brother     Social History Social History   Tobacco Use  . Smoking status: Never Smoker  . Smokeless tobacco: Never Used  Substance Use Topics  . Alcohol use: No    Alcohol/week: 0.0 standard drinks  . Drug use: No    Review of Systems  Constitutional: No fever/chills Eyes: No visual changes.  ENT: No sore throat. Cardiovascular: As above Respiratory: Denies shortness of breath. Gastrointestinal: No abdominal pain.  No nausea, no vomiting.   Genitourinary: Negative for dysuria. Musculoskeletal: Negative for back pain. Skin: Negative for rash. Neurological: Negative for headaches or weakness   ____________________________________________   PHYSICAL EXAM:  VITAL SIGNS: ED Triage Vitals  Enc Vitals Group     BP 09/11/18 1322 (!) 165/81     Pulse Rate 09/11/18 1321 91     Resp 09/11/18 1321 18     Temp 09/11/18 1321 98.5 F (36.9 C)     Temp Source 09/11/18 1321 Oral     SpO2 09/11/18 1321 98 %     Weight 09/11/18 1317 127 kg (280 lb)     Height 09/11/18 1317 1.702 m (5\' 7" )     Head Circumference --      Peak Flow --      Pain Score 09/11/18 1317 1     Pain Loc --      Pain Edu? --      Excl. in GC? --     Constitutional: Alert and oriented.  Eyes: Conjunctivae are normal.   Nose: No congestion/rhinnorhea. Mouth/Throat: Mucous membranes are moist.   Neck:  Painless ROM Cardiovascular: Normal rate, regular rhythm. Grossly normal heart sounds.  Good peripheral  circulation. Respiratory: Normal respiratory effort.  No retractions. Lungs CTAB. Gastrointestinal: Soft and nontender. No distention.    Musculoskeletal: No lower extremity tenderness nor edema.  Warm and well perfused Neurologic:  Normal speech and language. No gross focal neurologic deficits are appreciated.  Skin:  Skin is warm, dry and intact. No rash noted. Psychiatric: Mood and affect are normal. Speech and behavior are normal.  ____________________________________________   LABS (all labs ordered are listed, but only abnormal results are displayed)  Labs Reviewed  BASIC METABOLIC PANEL -  Abnormal; Notable for the following components:      Result Value   Glucose, Bld 217 (*)    All other components within normal limits  CBC  TROPONIN I  TROPONIN I   ____________________________________________  EKG  ED ECG REPORT I, Jene Every, the attending physician, personally viewed and interpreted this ECG.  Date: 09/11/2018  Rhythm: normal sinus rhythm QRS Axis: normal Intervals: normal ST/T Wave abnormalities: normal Narrative Interpretation: no evidence of acute ischemia  ____________________________________________  RADIOLOGY  Chest x-ray normal ____________________________________________   PROCEDURES  Procedure(s) performed: No  Procedures   Critical Care performed: No ____________________________________________   INITIAL IMPRESSION / ASSESSMENT AND PLAN / ED COURSE  Pertinent labs & imaging results that were available during my care of the patient were reviewed by me and considered in my medical decision making (see chart for details).  Patient well-appearing and chest pain-free in the emergency department.  EKG is reassuring, initial troponin is normal, however patient is relatively high risk second troponin will send second troponin  Second troponin normal, patient remains chest pain-free, appropriate for discharge at this time      ____________________________________________   FINAL CLINICAL IMPRESSION(S) / ED DIAGNOSES  Final diagnoses:  Chest pain, unspecified type        Note:  This document was prepared using Dragon voice recognition software and may include unintentional dictation errors.   Jene Every, MD 09/11/18 [phone]

## 2018-09-11 NOTE — ED Triage Notes (Signed)
PT to ED via EMS, called out for CP and SOB. PT states he was being chased by a dog and had to start running. PT states CP and SOB began after accident. PT states pain has resolved since. NAD , VSS

## 2018-09-15 ENCOUNTER — Ambulatory Visit: Payer: Medicaid Other | Admitting: Podiatry

## 2018-10-20 ENCOUNTER — Ambulatory Visit: Payer: Medicaid Other | Admitting: Pharmacist

## 2018-10-20 ENCOUNTER — Other Ambulatory Visit: Payer: Self-pay

## 2018-10-20 ENCOUNTER — Encounter: Payer: Self-pay | Admitting: Pharmacist

## 2018-10-20 DIAGNOSIS — Z79899 Other long term (current) drug therapy: Secondary | ICD-10-CM
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
## 2018-10-27 ENCOUNTER — Telehealth: Payer: Self-pay | Admitting: Pharmacist

## 2018-10-27 NOTE — Telephone Encounter (Signed)
Mr. Maclellan called and inquired about his eye drops. [facility] previously filled Patanol.  This is available as an OTC Warden/ranger). Although, if he can get a Rx from his doctor, it should be covered by his insurance at a lower co-pay.  Uel Davidow K. Joelene Millin, PharmD [facility]-Pharmacy Operations Coordinator [phone]

## 2018-11-13 ENCOUNTER — Other Ambulatory Visit: Payer: Self-pay | Admitting: Cardiovascular Disease

## 2018-11-13 DIAGNOSIS — I200 Unstable angina: Secondary | ICD-10-CM | POA: Insufficient documentation

## 2018-11-14 ENCOUNTER — Other Ambulatory Visit: Payer: Self-pay

## 2018-11-14 ENCOUNTER — Ambulatory Visit
Admission: RE | Admit: 2018-11-14 | Discharge: 2018-11-14 | Disposition: A | Discharge status: Home / Self Care | Payer: Medicaid Other | Source: Ambulatory Visit | Attending: Cardiovascular Disease | Admitting: Cardiovascular Disease

## 2018-11-14 DIAGNOSIS — Z1159 Encounter for screening for other viral diseases: Secondary | ICD-10-CM | POA: Insufficient documentation

## 2018-11-15 LAB — NOVEL CORONAVIRUS, NAA (HOSP ORDER, SEND-OUT TO REF LAB; TAT 18-24 HRS): SARS-CoV-2, NAA: NOT DETECTED

## 2018-11-17 ENCOUNTER — Encounter: Payer: Self-pay | Admitting: *Deleted

## 2018-11-17 ENCOUNTER — Inpatient Hospital Stay
Admission: RE | Admit: 2018-11-17 | Discharge: 2018-11-19 | DRG: 247 | Disposition: A | Discharge status: Home / Self Care | Payer: Medicaid Other | Source: Ambulatory Visit | Attending: Internal Medicine | Admitting: Internal Medicine

## 2018-11-17 ENCOUNTER — Encounter
Admission: RE | Disposition: A | Discharge status: Home / Self Care | Payer: Self-pay | Source: Ambulatory Visit | Attending: Internal Medicine

## 2018-11-17 ENCOUNTER — Other Ambulatory Visit: Payer: Self-pay

## 2018-11-17 DIAGNOSIS — Z833 Family history of diabetes mellitus: Secondary | ICD-10-CM | POA: Diagnosis not present

## 2018-11-17 DIAGNOSIS — I130 Hypertensive heart and chronic kidney disease with heart failure and stage 1 through stage 4 chronic kidney disease, or unspecified chronic kidney disease: Secondary | ICD-10-CM | POA: Diagnosis present

## 2018-11-17 DIAGNOSIS — M069 Rheumatoid arthritis, unspecified: Secondary | ICD-10-CM | POA: Diagnosis present

## 2018-11-17 DIAGNOSIS — E1122 Type 2 diabetes mellitus with diabetic chronic kidney disease: Secondary | ICD-10-CM | POA: Diagnosis present

## 2018-11-17 DIAGNOSIS — Z91041 Radiographic dye allergy status: Secondary | ICD-10-CM | POA: Diagnosis not present

## 2018-11-17 DIAGNOSIS — Z8673 Personal history of transient ischemic attack (TIA), and cerebral infarction without residual deficits: Secondary | ICD-10-CM | POA: Diagnosis not present

## 2018-11-17 DIAGNOSIS — Z955 Presence of coronary angioplasty implant and graft: Secondary | ICD-10-CM | POA: Diagnosis not present

## 2018-11-17 DIAGNOSIS — Z91013 Allergy to seafood: Secondary | ICD-10-CM

## 2018-11-17 DIAGNOSIS — E039 Hypothyroidism, unspecified: Secondary | ICD-10-CM | POA: Diagnosis present

## 2018-11-17 DIAGNOSIS — I25110 Atherosclerotic heart disease of native coronary artery with unstable angina pectoris: Principal | ICD-10-CM | POA: Diagnosis present

## 2018-11-17 DIAGNOSIS — Z79890 Hormone replacement therapy (postmenopausal): Secondary | ICD-10-CM

## 2018-11-17 DIAGNOSIS — Z6841 Body Mass Index (BMI) 40.0 and over, adult: Secondary | ICD-10-CM | POA: Diagnosis not present

## 2018-11-17 DIAGNOSIS — G4730 Sleep apnea, unspecified: Secondary | ICD-10-CM | POA: Diagnosis present

## 2018-11-17 DIAGNOSIS — N182 Chronic kidney disease, stage 2 (mild): Secondary | ICD-10-CM | POA: Diagnosis present

## 2018-11-17 DIAGNOSIS — I252 Old myocardial infarction: Secondary | ICD-10-CM

## 2018-11-17 DIAGNOSIS — I249 Acute ischemic heart disease, unspecified: Secondary | ICD-10-CM | POA: Diagnosis present

## 2018-11-17 DIAGNOSIS — I5032 Chronic diastolic (congestive) heart failure: Secondary | ICD-10-CM | POA: Diagnosis present

## 2018-11-17 DIAGNOSIS — Z794 Long term (current) use of insulin: Secondary | ICD-10-CM | POA: Diagnosis not present

## 2018-11-17 DIAGNOSIS — E785 Hyperlipidemia, unspecified: Secondary | ICD-10-CM | POA: Diagnosis present

## 2018-11-17 DIAGNOSIS — I200 Unstable angina: Secondary | ICD-10-CM | POA: Insufficient documentation

## 2018-11-17 DIAGNOSIS — Z79899 Other long term (current) drug therapy: Secondary | ICD-10-CM

## 2018-11-17 DIAGNOSIS — Z882 Allergy status to sulfonamides status: Secondary | ICD-10-CM | POA: Diagnosis not present

## 2018-11-17 DIAGNOSIS — Z7982 Long term (current) use of aspirin: Secondary | ICD-10-CM | POA: Diagnosis not present

## 2018-11-17 HISTORY — PX: CORONARY STENT INTERVENTION: CATH118234

## 2018-11-17 HISTORY — PX: LEFT HEART CATH AND CORONARY ANGIOGRAPHY: CATH118249

## 2018-11-17 LAB — GLUCOSE, CAPILLARY
Glucose-Capillary: 76 mg/dL (ref 70–99)
Glucose-Capillary: 54 mg/dL — ABNORMAL LOW (ref 70–99)
Glucose-Capillary: 71 mg/dL (ref 70–99)
Glucose-Capillary: 348 mg/dL — ABNORMAL HIGH (ref 70–99)

## 2018-11-17 LAB — POCT ACTIVATED CLOTTING TIME
Activated Clotting Time: 180 seconds
Activated Clotting Time: 422 seconds

## 2018-11-17 SURGERY — LEFT HEART CATH AND CORONARY ANGIOGRAPHY
Anesthesia: Moderate Sedation

## 2018-11-17 MED ORDER — PRASUGREL HCL 10 MG PO TABS: 10.0000 mg | ORAL_TABLET | Freq: Every day | ORAL | Status: DC

## 2018-11-17 MED ORDER — PRASUGREL HCL 10 MG PO TABS
10.0000 mg | ORAL_TABLET | Freq: Every day | ORAL | Status: DC
  Administered 2018-11-17 – 2018-11-19 (×3): 10 mg via ORAL
  Filled 2018-11-17 (×3): qty 1

## 2018-11-17 MED ORDER — BIVALIRUDIN TRIFLUOROACETATE 250 MG IV SOLR
INTRAVENOUS | Status: AC
  Filled 2018-11-17: qty 250

## 2018-11-17 MED ORDER — HEPARIN SODIUM (PORCINE) 1000 UNIT/ML IJ SOLN
INTRAMUSCULAR | Status: AC
  Filled 2018-11-17: qty 1

## 2018-11-17 MED ORDER — METOPROLOL SUCCINATE ER 100 MG PO TB24
100.0000 mg | ORAL_TABLET | Freq: Every day | ORAL | Status: DC
  Administered 2018-11-17 – 2018-11-19 (×3): 100 mg via ORAL
  Filled 2018-11-17 (×3): qty 1

## 2018-11-17 MED ORDER — HYDRALAZINE HCL 20 MG/ML IJ SOLN
INTRAMUSCULAR | Status: AC
  Administered 2018-11-17: 19:00:00 10 mg via INTRAVENOUS
  Filled 2018-11-17: qty 1

## 2018-11-17 MED ORDER — SODIUM CHLORIDE 0.9 % IV SOLN: INTRAVENOUS | Status: DC

## 2018-11-17 MED ORDER — DEXTROSE 50 % IV SOLN
1.0000 | Freq: Once | INTRAVENOUS | Status: AC
  Administered 2018-11-17: 13:00:00 50 mL via INTRAVENOUS

## 2018-11-17 MED ORDER — RAMIPRIL 10 MG PO CAPS
10.0000 mg | ORAL_CAPSULE | Freq: Every day | ORAL | Status: DC
  Administered 2018-11-17 – 2018-11-19 (×3): 10 mg via ORAL
  Filled 2018-11-17 (×3): qty 1

## 2018-11-17 MED ORDER — VERAPAMIL HCL 2.5 MG/ML IV SOLN
INTRAVENOUS | Status: DC | PRN
  Administered 2018-11-17: 2.5 mg via INTRA_ARTERIAL

## 2018-11-17 MED ORDER — IOHEXOL 300 MG/ML  SOLN
INTRAMUSCULAR | Status: DC | PRN
  Administered 2018-11-17: 110 mL via INTRA_ARTERIAL

## 2018-11-17 MED ORDER — IOHEXOL 300 MG/ML  SOLN
INTRAMUSCULAR | Status: DC | PRN
  Administered 2018-11-17: 135 mL via INTRA_ARTERIAL

## 2018-11-17 MED ORDER — SODIUM CHLORIDE 0.9% FLUSH: 3.0000 mL | INTRAVENOUS | Status: DC | PRN

## 2018-11-17 MED ORDER — EZETIMIBE 10 MG PO TABS
10.0000 mg | ORAL_TABLET | Freq: Two times a day (BID) | ORAL | Status: DC
  Administered 2018-11-17 – 2018-11-19 (×4): 10 mg via ORAL
  Filled 2018-11-17 (×4): qty 1

## 2018-11-17 MED ORDER — RANOLAZINE ER 500 MG PO TB12
500.0000 mg | ORAL_TABLET | Freq: Two times a day (BID) | ORAL | Status: DC
  Administered 2018-11-17 – 2018-11-19 (×4): 500 mg via ORAL
  Filled 2018-11-17 (×5): qty 1

## 2018-11-17 MED ORDER — ACETAMINOPHEN 325 MG PO TABS: 650.0000 mg | ORAL_TABLET | ORAL | Status: DC | PRN

## 2018-11-17 MED ORDER — SODIUM CHLORIDE 0.9 % IV SOLN
0.2500 mg/kg/h | INTRAVENOUS | Status: AC
  Filled 2018-11-17: qty 250

## 2018-11-17 MED ORDER — DIPHENHYDRAMINE HCL 25 MG PO CAPS
50.0000 mg | ORAL_CAPSULE | Freq: Once | ORAL | Status: AC
  Administered 2018-11-17: 11:00:00 50 mg via ORAL

## 2018-11-17 MED ORDER — AMLODIPINE BESYLATE 10 MG PO TABS
10.0000 mg | ORAL_TABLET | Freq: Every day | ORAL | Status: DC
  Administered 2018-11-17 – 2018-11-19 (×3): 10 mg via ORAL
  Filled 2018-11-17 (×3): qty 1

## 2018-11-17 MED ORDER — SODIUM CHLORIDE 0.9 % IV SOLN: 250.0000 mL | INTRAVENOUS | Status: DC | PRN

## 2018-11-17 MED ORDER — ASPIRIN 81 MG PO CHEW
CHEWABLE_TABLET | ORAL | Status: AC
  Administered 2018-11-17: 11:00:00 81 mg via ORAL
  Filled 2018-11-17: qty 1

## 2018-11-17 MED ORDER — DOCUSATE SODIUM 100 MG PO CAPS
100.0000 mg | ORAL_CAPSULE | Freq: Every day | ORAL | Status: DC
  Administered 2018-11-17 – 2018-11-18 (×2): 100 mg via ORAL
  Filled 2018-11-17 (×2): qty 1

## 2018-11-17 MED ORDER — DIPHENHYDRAMINE HCL 25 MG PO CAPS
ORAL_CAPSULE | ORAL | Status: AC
  Administered 2018-11-17: 11:00:00 50 mg via ORAL
  Filled 2018-11-17: qty 2

## 2018-11-17 MED ORDER — FUROSEMIDE 20 MG PO TABS
20.0000 mg | ORAL_TABLET | Freq: Two times a day (BID) | ORAL | Status: DC
  Administered 2018-11-18 – 2018-11-19 (×3): 20 mg via ORAL
  Filled 2018-11-17 (×3): qty 1

## 2018-11-17 MED ORDER — INSULIN ASPART 100 UNIT/ML ~~LOC~~ SOLN
0.0000 [IU] | Freq: Every day | SUBCUTANEOUS | Status: DC
  Administered 2018-11-17: 22:00:00 4 [IU] via SUBCUTANEOUS
  Filled 2018-11-17: qty 1

## 2018-11-17 MED ORDER — ROSUVASTATIN CALCIUM 10 MG PO TABS
40.0000 mg | ORAL_TABLET | Freq: Every evening | ORAL | Status: DC
  Administered 2018-11-17 – 2018-11-18 (×2): 40 mg via ORAL
  Filled 2018-11-17 (×2): qty 4

## 2018-11-17 MED ORDER — FENTANYL CITRATE (PF) 100 MCG/2ML IJ SOLN
INTRAMUSCULAR | Status: DC | PRN
  Administered 2018-11-17: 50 ug via INTRAVENOUS

## 2018-11-17 MED ORDER — SODIUM CHLORIDE 0.9 % IV SOLN
INTRAVENOUS | Status: AC | PRN
  Administered 2018-11-17: 0.25 mg/kg/h via INTRAVENOUS
  Administered 2018-11-17: 1.75 mg/kg/h via INTRAVENOUS

## 2018-11-17 MED ORDER — PRASUGREL HCL 10 MG PO TABS
ORAL_TABLET | ORAL | Status: DC | PRN
  Administered 2018-11-17: 60 mg via ORAL

## 2018-11-17 MED ORDER — LEVOTHYROXINE SODIUM 25 MCG PO TABS
125.0000 ug | ORAL_TABLET | Freq: Every day | ORAL | Status: DC
  Administered 2018-11-17 – 2018-11-19 (×2): 125 ug via ORAL
  Filled 2018-11-17 (×2): qty 1

## 2018-11-17 MED ORDER — ASPIRIN 81 MG PO CHEW
CHEWABLE_TABLET | ORAL | Status: AC
  Filled 2018-11-17: qty 3

## 2018-11-17 MED ORDER — OLOPATADINE HCL 0.1 % OP SOLN
1.0000 [drp] | Freq: Every day | OPHTHALMIC | Status: DC
  Administered 2018-11-17 – 2018-11-19 (×3): 1 [drp] via OPHTHALMIC
  Filled 2018-11-17: qty 5

## 2018-11-17 MED ORDER — INSULIN GLARGINE 100 UNIT/ML ~~LOC~~ SOLN
160.0000 [IU] | Freq: Every day | SUBCUTANEOUS | Status: DC
  Administered 2018-11-17 – 2018-11-18 (×2): 160 [IU] via SUBCUTANEOUS
  Filled 2018-11-17 (×2): qty 1.6

## 2018-11-17 MED ORDER — NITROGLYCERIN 1 MG/10 ML FOR IR/CATH LAB
INTRA_ARTERIAL | Status: AC
  Filled 2018-11-17: qty 10

## 2018-11-17 MED ORDER — ASPIRIN 81 MG PO CHEW
CHEWABLE_TABLET | ORAL | Status: DC | PRN
  Administered 2018-11-17: 243 mg via ORAL

## 2018-11-17 MED ORDER — SODIUM CHLORIDE 0.9% FLUSH
3.0000 mL | Freq: Two times a day (BID) | INTRAVENOUS | Status: DC
  Administered 2018-11-18 – 2018-11-19 (×4): 3 mL via INTRAVENOUS

## 2018-11-17 MED ORDER — MIDAZOLAM HCL 2 MG/2ML IJ SOLN
INTRAMUSCULAR | Status: DC | PRN
  Administered 2018-11-17: 1 mg via INTRAVENOUS

## 2018-11-17 MED ORDER — HEPARIN SODIUM (PORCINE) 1000 UNIT/ML IJ SOLN
INTRAMUSCULAR | Status: DC | PRN
  Administered 2018-11-17: 6000 [IU] via INTRAVENOUS

## 2018-11-17 MED ORDER — MIDAZOLAM HCL 2 MG/2ML IJ SOLN
INTRAMUSCULAR | Status: AC
  Filled 2018-11-17: qty 2

## 2018-11-17 MED ORDER — ASPIRIN EC 81 MG PO TBEC: 81.0000 mg | DELAYED_RELEASE_TABLET | Freq: Every day | ORAL | Status: DC

## 2018-11-17 MED ORDER — SODIUM CHLORIDE 0.9 % WEIGHT BASED INFUSION
1.0000 mL/kg/h | INTRAVENOUS | Status: AC
  Administered 2018-11-17: 20:00:00 1 mL/kg/h via INTRAVENOUS

## 2018-11-17 MED ORDER — INSULIN ASPART 100 UNIT/ML ~~LOC~~ SOLN
0.0000 [IU] | Freq: Three times a day (TID) | SUBCUTANEOUS | Status: DC
  Administered 2018-11-18: 2 [IU] via SUBCUTANEOUS
  Filled 2018-11-17: qty 1

## 2018-11-17 MED ORDER — BIVALIRUDIN BOLUS VIA INFUSION - CUPID
INTRAVENOUS | Status: DC | PRN
  Administered 2018-11-17: 93.525 mg via INTRAVENOUS

## 2018-11-17 MED ORDER — ISOSORBIDE MONONITRATE ER 60 MG PO TB24
60.0000 mg | ORAL_TABLET | Freq: Every day | ORAL | Status: DC
  Administered 2018-11-17 – 2018-11-19 (×3): 60 mg via ORAL
  Filled 2018-11-17 (×3): qty 1

## 2018-11-17 MED ORDER — ONDANSETRON HCL 4 MG/2ML IJ SOLN: 4.0000 mg | Freq: Four times a day (QID) | INTRAMUSCULAR | Status: DC | PRN

## 2018-11-17 MED ORDER — DEXTROSE 50 % IV SOLN
INTRAVENOUS | Status: AC
  Administered 2018-11-17: 50 mL via INTRAVENOUS
  Filled 2018-11-17: qty 50

## 2018-11-17 MED ORDER — VERAPAMIL HCL 2.5 MG/ML IV SOLN
INTRAVENOUS | Status: AC
  Filled 2018-11-17: qty 2

## 2018-11-17 MED ORDER — METHYLPREDNISOLONE SODIUM SUCC 125 MG IJ SOLR
INTRAMUSCULAR | Status: AC
  Administered 2018-11-17: 11:00:00 125 mg via INTRAVENOUS
  Filled 2018-11-17: qty 2

## 2018-11-17 MED ORDER — LABETALOL HCL 5 MG/ML IV SOLN
10.0000 mg | INTRAVENOUS | Status: AC | PRN
  Administered 2018-11-17: 18:00:00 10 mg via INTRAVENOUS

## 2018-11-17 MED ORDER — ASPIRIN 81 MG PO CHEW
81.0000 mg | CHEWABLE_TABLET | ORAL | Status: AC
  Administered 2018-11-17: 11:00:00 81 mg via ORAL

## 2018-11-17 MED ORDER — METHYLPREDNISOLONE SODIUM SUCC 125 MG IJ SOLR
125.0000 mg | Freq: Once | INTRAMUSCULAR | Status: AC
  Administered 2018-11-17: 11:00:00 125 mg via INTRAVENOUS

## 2018-11-17 MED ORDER — PRASUGREL HCL 10 MG PO TABS
ORAL_TABLET | ORAL | Status: AC
  Filled 2018-11-17: qty 6

## 2018-11-17 MED ORDER — HEPARIN (PORCINE) IN NACL 1000-0.9 UT/500ML-% IV SOLN
INTRAVENOUS | Status: AC
  Filled 2018-11-17: qty 1000

## 2018-11-17 MED ORDER — LABETALOL HCL 5 MG/ML IV SOLN
INTRAVENOUS | Status: AC
  Administered 2018-11-17: 18:00:00 10 mg via INTRAVENOUS
  Filled 2018-11-17: qty 4

## 2018-11-17 MED ORDER — FENTANYL CITRATE (PF) 100 MCG/2ML IJ SOLN
INTRAMUSCULAR | Status: AC
  Filled 2018-11-17: qty 2

## 2018-11-17 MED ORDER — HYDRALAZINE HCL 20 MG/ML IJ SOLN
10.0000 mg | INTRAMUSCULAR | Status: AC | PRN
  Administered 2018-11-17: 19:00:00 10 mg via INTRAVENOUS

## 2018-11-17 MED ORDER — ASPIRIN 81 MG PO CHEW
81.0000 mg | CHEWABLE_TABLET | Freq: Every day | ORAL | Status: DC
  Administered 2018-11-18 – 2018-11-19 (×2): 81 mg via ORAL
  Filled 2018-11-17 (×2): qty 1

## 2018-11-17 MED ORDER — FLUTICASONE PROPIONATE 50 MCG/ACT NA SUSP
1.0000 | Freq: Every day | NASAL | Status: DC
  Administered 2018-11-18 – 2018-11-19 (×2): 1 via NASAL
  Filled 2018-11-17: qty 16

## 2018-11-17 MED ORDER — SODIUM CHLORIDE 0.9% FLUSH: 3.0000 mL | Freq: Two times a day (BID) | INTRAVENOUS | Status: DC

## 2018-11-17 MED ORDER — NITROGLYCERIN 1 MG/10 ML FOR IR/CATH LAB
INTRA_ARTERIAL | Status: DC | PRN
  Administered 2018-11-17: 200 ug via INTRACORONARY

## 2018-11-17 SURGICAL SUPPLY — 18 items
BALLN TREK RX 2.5X15 (BALLOONS) ×4
BALLN ~~LOC~~ TREK RX 3.25X12 (BALLOONS) ×4
BALLOON TREK RX 2.5X15 (BALLOONS) ×2 IMPLANT
BALLOON ~~LOC~~ TREK RX 3.25X12 (BALLOONS) ×2 IMPLANT
CATH 5F 110X4 TIG (CATHETERS) ×4 IMPLANT
CATH INFINITI 5 FR 3DRC (CATHETERS) ×4 IMPLANT
CATH INFINITI 5 FR JL3.5 (CATHETERS) ×4 IMPLANT
CATH INFINITI JR4 5F (CATHETERS) ×4 IMPLANT
CATH VISTA GUIDE 6FR JR4 (CATHETERS) ×4 IMPLANT
DEVICE INFLAT 30 PLUS (MISCELLANEOUS) ×4 IMPLANT
DEVICE RAD COMP TR BAND LRG (VASCULAR PRODUCTS) ×4 IMPLANT
GLIDESHEATH SLEND SS 6F .021 (SHEATH) ×4 IMPLANT
KIT MANI 3VAL PERCEP (MISCELLANEOUS) ×4 IMPLANT
PACK CARDIAC CATH (CUSTOM PROCEDURE TRAY) ×4 IMPLANT
STENT RESOLUTE ONYX 3.0X15 (Permanent Stent) ×4 IMPLANT
WIRE G HI TQ BMW 190 (WIRE) ×4 IMPLANT
WIRE HITORQ VERSACORE ST 145CM (WIRE) ×4 IMPLANT
WIRE ROSEN-J .035X260CM (WIRE) ×4 IMPLANT

## 2018-11-17 NOTE — Consult Note (Signed)
Derrick Barajas is a 63 y.o. male  [id number]  Primary Cardiologist: Adrian Blackwater Reason for Consultation: Chest pain unstable angina  HPI: This is a 63 year old African-American male with a history of hypertension hyperlipidemia CHF sleep apnea presented to the office on Thursday with acute onset of chest pain with EKG showing sinus tachycardia nonspecific ST-T changes requiring sublingual nitroglycerin and beta-blocker to stabilize the patient.  Patient underwent CTA coronaries which showed high-grade lesion in the mid RCA and possibly left circumflex and was scheduled for angioplasty this afternoon.  Patient is to have angioplasty with drug-eluting stent of RCA and treat medically circumflex lesion for now.   Review of Systems: No chest pain at this time   Past Medical History:  Diagnosis Date  . CAD (coronary artery disease)   . CHF (congestive heart failure) (HCC)   . Diabetes mellitus without complication (HCC)   . Gout   . Hyperlipidemia   . Hypertension   . MI (myocardial infarction) (HCC)   . RA (rheumatoid arthritis) (HCC)   . Sleep apnea   . 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 Thyroid disease     Medications Prior to Admission  Medication Sig Dispense Refill  . ACCU-CHEK AVIVA PLUS test strip TEST TID  11  . amLODipine (NORVASC) 10 MG tablet Take 10 mg by mouth daily.     Marland Kitchen aspirin EC 81 MG tablet Take 81 mg by mouth daily.    . B-D ULTRAFINE III SHORT PEN 31G X 8 MM MISC USE UTD BID  3  . docusate sodium (COLACE) 100 MG capsule Take 100 mg at bedtime by mouth.     . ezetimibe (ZETIA) 10 MG tablet Take 10 mg by mouth 2 (two) times daily.    . fluticasone (FLONASE) 50 MCG/ACT nasal spray Place 1 spray into both nostrils daily.   1  . furosemide (LASIX) 20 MG tablet Take 20 mg by mouth 2 (two) times daily.     . Insulin Degludec (TRESIBA FLEXTOUCH) 200 UNIT/ML SOPN Inject 160 Units into the skin daily.     . INVOKANA 300 MG TABS tablet Take 300 mg by mouth daily.    0  . isosorbide mononitrate (IMDUR) 60 MG 24 hr tablet Take 60 mg daily by mouth.    . levothyroxine (SYNTHROID, LEVOTHROID) 125 MCG tablet Take 125 mcg by mouth daily before breakfast.   2  . metoprolol succinate (TOPROL-XL) 100 MG 24 hr tablet Take 100 mg daily by mouth. Take with or immediately following a meal.    . olopatadine (PATANOL) 0.1 % ophthalmic solution Place 1 drop into both eyes daily.    . prasugrel (EFFIENT) 10 MG TABS Take 10 mg by mouth daily at 3 pm.     . ramipril (ALTACE) 10 MG capsule Take 10 mg by mouth daily.   2  . ranolazine (RANEXA) 500 MG 12 hr tablet Take 500 mg by mouth 2 (two) times daily.    . rosuvastatin (CRESTOR) 40 MG tablet Take 40 mg by mouth every evening.   3  . TRULICITY 1.5 MG/0.5ML SOPN Inject 1.5 mg into the skin every Wednesday.   2     . sodium chloride flush  3 mL Intravenous Q12H    Infusions: . sodium chloride    . sodium chloride    . bivalirudin (ANGIOMAX) infusion 5 mg/mL 1.75 mg/kg/hr (11/17/18 1447)    Allergies  Allergen Reactions  . Shellfish-Derived Products Other (See  Comments)    Other reaction(s): Nausea/Vomiting/Diarrhea, Shock/Unconsciousness  . Sulfa Antibiotics Other (See Comments)    Other reaction(s): Nausea/Vomiting/Diarrhea, Shock/Unconsciousness  . Iodine Nausea And Vomiting  . Atorvastatin Other (See Comments)  . Iodinated Diagnostic Agents Other (See Comments)  . Nsaids Other (See Comments)    D/t cardiac meds  . Shrimp [Shellfish Allergy] Nausea And Vomiting    Social History   Socioeconomic History  . Marital status: Legally Separated    Spouse name: Not on file  . Number of children: Not on file  . Years of education: Not on file  . Highest education level: Not on file  Occupational History  . Not on file  Social Needs  . Financial resource strain: Not on file  . Food insecurity:    Worry: Not on file    Inability: Not on file  . Transportation needs:    Medical: Not on file     Non-medical: Not on file  Tobacco Use  . Smoking status: Never Smoker  . Smokeless tobacco: Never Used  Substance and Sexual Activity  . Alcohol use: No    Alcohol/week: 0.0 standard drinks  . Drug use: No  . Sexual activity: Never    Partners: Female    Birth control/protection: None  Lifestyle  . Physical activity:    Days per week: Not on file    Minutes per session: Not on file  . Stress: Not on file  Relationships  . Social connections:    Talks on phone: Not on file    Gets together: Not on file    Attends religious service: Not on file    Active member of club or organization: Not on file    Attends meetings of clubs or organizations: Not on file    Relationship status: Not on file  . Intimate partner violence:    Fear of current or ex partner: Not on file    Emotionally abused: Not on file    Physically abused: Not on file    Forced sexual activity: Not on file  Other Topics Concern  . Not on file  Social History Narrative  . Not on file    Family History  Problem Relation Age of Onset  . Diabetes Mother   . Diabetes Brother     PHYSICAL EXAM: Vitals:   11/17/18 1016  BP: (!) 158/101  Pulse: 83  Resp: 20  Temp: 98.3 F (36.8 C)  SpO2: 95%    No intake or output data in the 24 hours ending 11/17/18 1453  General:  Well appearing. No respiratory difficulty HEENT: normal Neck: supple. no JVD. Carotids 2+ bilat; no bruits. No lymphadenopathy or thryomegaly appreciated. Cor: PMI nondisplaced. Regular rate & rhythm. No rubs, gallops or murmurs. Lungs: clear Abdomen: soft, nontender, nondistended. No hepatosplenomegaly. No bruits or masses. Good bowel sounds. Extremities: no cyanosis, clubbing, rash, edema Neuro: alert & oriented x 3, cranial nerves grossly intact. moves all 4 extremities w/o difficulty. Affect pleasant.  ECG: Sinus rhythm on monitor  Results for orders placed or performed during the hospital encounter of 11/17/18 (from the past 24  hour(s))  Glucose, capillary     Status: None   Collection Time: 11/17/18 10:03 AM  Result Value Ref Range   Glucose-Capillary 76 70 - 99 mg/dL  Glucose, capillary     Status: Abnormal   Collection Time: 11/17/18 12:59 PM  Result Value Ref Range   Glucose-Capillary 54 (L) 70 - 99 mg/dL   No results  found.   ASSESSMENT AND PLAN: Unstable angina with two-vessel coronary artery disease and 95% mid RCA requiring PCI with drug-eluting stent and will be observed overnight with discharge in the morning with follow-up this week in the office.  Patient already has a follow-up appointment.  Acelin Ferdig A

## 2018-11-17 NOTE — Progress Notes (Addendum)
Pt CBG checked just prior to Cath Techs taking for procedure. CBG 52. Dr. Welton Flakes paged and updated. Orders received for D50 1 Amp.  1 Amp D 50 given STAT.

## 2018-11-17 NOTE — Progress Notes (Signed)
Family Meeting Note  Advance Directive:yes  Today a meeting took place with the Patient.  The following clinical team members were present during this meeting:MD  The following were discussed:Patient's diagnosis: Coronary artery disease, unstable angina, Patient's progosis: Unable to determine and Goals for treatment: Continue present management  Patient clarified that he would not like to have intubation or ventilatory support any adverse event but he would be okay with her having CPR or defibrillator use over medication use.  Additional follow-up to be provided: Cardiologist  Time spent during discussion:20 minutes  Altamese Dilling, MD

## 2018-11-17 NOTE — H&P (Signed)
[facility] - [place] at [facility]   PATIENT NAME: Derrick Barajas    MR#:  [id number]  DATE OF BIRTH:  10/28/1955  DATE OF ADMISSION:  11/17/2018  PRIMARY CARE PHYSICIAN: Margaretann Loveless, MD   REQUESTING/REFERRING PHYSICIAN: Welton Flakes  CHIEF COMPLAINT:  No chief complaint on file.   HISTORY OF PRESENT ILLNESS: Derrick Barajas  is a 63 y.o. male with a known history of coronary artery disease, CHF, diabetes, hyperlipidemia, hypertension, myocardial infarction, sleep apnea, stroke, thyroid disease-had anginal chest pain symptoms so went to his cardiologist office with CT scan angiogram on coronaries was done which showed some blockages and Dr. Welton Flakes scheduled him to have cardiac catheterization.  They found him having significant blockages on right coronary artery where drug-eluting stent was placed.  Due to this cardiology suggested to admit to hospitalist service and keep in the hospital for tonight.  Patient denies any complaints currently. As per Dr. Welton Flakes he is COVID test was negative.  PAST MEDICAL HISTORY:   Past Medical History:  Diagnosis Date  . CAD (coronary artery disease)   . CHF (congestive heart failure) (HCC)   . Diabetes mellitus without complication (HCC)   . Gout   . Hyperlipidemia   . Hypertension   . MI (myocardial infarction) (HCC)   . RA (rheumatoid arthritis) (HCC)   . Sleep apnea   . Stroke [facility])    left facal droop  . Thyroid disease     PAST SURGICAL HISTORY:  Past Surgical History:  Procedure Laterality Date  . CIRCUMCISION    . CORONARY ANGIOPLASTY WITH STENT PLACEMENT    . TONSILLECTOMY      SOCIAL HISTORY:  Social History   Tobacco Use  . Smoking status: Never Smoker  . Smokeless tobacco: Never Used  Substance Use Topics  . Alcohol use: No    Alcohol/week: 0.0 standard drinks    FAMILY HISTORY:  Family History  Problem Relation Age of Onset  . Diabetes Mother   . Diabetes Brother     DRUG ALLERGIES:  Allergies  Allergen  Reactions  . Shellfish-Derived Products Other (See Comments)    Other reaction(s): Nausea/Vomiting/Diarrhea, Shock/Unconsciousness  . Sulfa Antibiotics Other (See Comments)    Other reaction(s): Nausea/Vomiting/Diarrhea, Shock/Unconsciousness  . Iodine Nausea And Vomiting  . Atorvastatin Other (See Comments)  . Iodinated Diagnostic Agents Other (See Comments)  . Nsaids Other (See Comments)    D/t cardiac meds  . Shrimp [Shellfish Allergy] Nausea And Vomiting    REVIEW OF SYSTEMS:   CONSTITUTIONAL: No fever, fatigue or weakness.  EYES: No blurred or double vision.  EARS, NOSE, AND THROAT: No tinnitus or ear pain.  RESPIRATORY: No cough, shortness of breath, wheezing or hemoptysis.  CARDIOVASCULAR: No chest pain, orthopnea, edema.  GASTROINTESTINAL: No nausea, vomiting, diarrhea or abdominal pain.  GENITOURINARY: No dysuria, hematuria.  ENDOCRINE: No polyuria, nocturia,  HEMATOLOGY: No anemia, easy bruising or bleeding SKIN: No rash or lesion. MUSCULOSKELETAL: No joint pain or arthritis.   NEUROLOGIC: No tingling, numbness, weakness.  PSYCHIATRY: No anxiety or depression.   MEDICATIONS AT HOME:  Prior to Admission medications   Medication Sig Start Date End Date Taking? Authorizing Provider  ACCU-CHEK AVIVA PLUS test strip TEST TID 02/02/17  Yes [provider]  amLODipine (NORVASC) 10 MG tablet Take 10 mg by mouth daily.    Yes [provider]  aspirin EC 81 MG tablet Take 81 mg by mouth daily.   Yes [provider]  B-D ULTRAFINE III  SHORT PEN 31G X 8 MM MISC USE UTD BID 04/10/17  Yes [provider]  docusate sodium (COLACE) 100 MG capsule Take 100 mg at bedtime by mouth.    Yes [provider]  ezetimibe (ZETIA) 10 MG tablet Take 10 mg by mouth 2 (two) times daily.   Yes Adrian Blackwater A, MD  fluticasone (FLONASE) 50 MCG/ACT nasal spray Place 1 spray into both nostrils daily.  12/05/16  Yes [provider]  furosemide (LASIX)  20 MG tablet Take 20 mg by mouth 2 (two) times daily.    Yes [provider]  Insulin Degludec (TRESIBA FLEXTOUCH) 200 UNIT/ML SOPN Inject 160 Units into the skin daily.    Yes Sherron Monday, MD  INVOKANA 300 MG TABS tablet Take 300 mg by mouth daily.  10/07/15  Yes [provider]  isosorbide mononitrate (IMDUR) 60 MG 24 hr tablet Take 60 mg daily by mouth.   Yes Caroleen Hamman, FNP  levothyroxine (SYNTHROID, LEVOTHROID) 125 MCG tablet Take 125 mcg by mouth daily before breakfast.  09/26/15  Yes [provider]  metoprolol succinate (TOPROL-XL) 100 MG 24 hr tablet Take 100 mg daily by mouth. Take with or immediately following a meal.   Yes Caroleen Hamman, FNP  olopatadine (PATANOL) 0.1 % ophthalmic solution Place 1 drop into both eyes daily.   Yes [provider]  prasugrel (EFFIENT) 10 MG TABS Take 10 mg by mouth daily at 3 pm.    Yes [provider]  ramipril (ALTACE) 10 MG capsule Take 10 mg by mouth daily.  09/14/15  Yes [provider]  ranolazine (RANEXA) 500 MG 12 hr tablet Take 500 mg by mouth 2 (two) times daily.   Yes [provider]  rosuvastatin (CRESTOR) 40 MG tablet Take 40 mg by mouth every evening.  11/13/17  Yes [provider]  TRULICITY 1.5 MG/0.5ML SOPN Inject 1.5 mg into the skin every Wednesday.  11/30/17  Yes [provider]      PHYSICAL EXAMINATION:   VITAL SIGNS: Blood pressure (!) 153/92, pulse 89, temperature 98.3 F (36.8 C), temperature source Oral, resp. rate 16, height 5\' 7"  (1.702 m), weight 124.7 kg, SpO2 97 %.  GENERAL:  63 y.o.-year-old patient lying in the bed with no acute distress.  EYES: Pupils equal, round, reactive to light and accommodation. No scleral icterus. Extraocular muscles intact.  HEENT: Head atraumatic, normocephalic. Oropharynx and nasopharynx clear.  NECK:  Supple, no jugular venous distention. No thyroid enlargement, no tenderness.  LUNGS: Normal  breath sounds bilaterally, no wheezing, rales,rhonchi or crepitation. No use of accessory muscles of respiration.  CARDIOVASCULAR: S1, S2 normal. No murmurs, rubs, or gallops.  ABDOMEN: Soft, nontender, nondistended. Bowel sounds present. No organomegaly or mass.  EXTREMITIES: No pedal edema, cyanosis, or clubbing.  Right wrist post procedure bandages present. NEUROLOGIC: Cranial nerves II through XII are intact. Muscle strength 5/5 in all extremities. Sensation intact. Gait not checked.  PSYCHIATRIC: The patient is alert and oriented x 3.  SKIN: No obvious rash, lesion, or ulcer.   LABORATORY PANEL:   CBC No results for input(s): WBC, HGB, HCT, PLT, MCV, MCH, MCHC, RDW, LYMPHSABS, MONOABS, EOSABS, BASOSABS, BANDABS in the last 168 hours.  Invalid input(s): NEUTRABS, BANDSABD ------------------------------------------------------------------------------------------------------------------  Chemistries  No results for input(s): NA, K, CL, CO2, GLUCOSE, BUN, CREATININE, CALCIUM, MG, AST, ALT, ALKPHOS, BILITOT in the last 168 hours.  Invalid input(s): GFRCGP ------------------------------------------------------------------------------------------------------------------ CrCl cannot be calculated (Patient's most recent lab result is older  than the maximum 21 days allowed.). ------------------------------------------------------------------------------------------------------------------ No results for input(s): TSH, T4TOTAL, T3FREE, THYROIDAB in the last 72 hours.  Invalid input(s): FREET3   Coagulation profile No results for input(s): INR, PROTIME in the last 168 hours. ------------------------------------------------------------------------------------------------------------------- No results for input(s): DDIMER in the last 72 hours. -------------------------------------------------------------------------------------------------------------------  Cardiac Enzymes No results for  input(s): CKMB, TROPONINI, MYOGLOBIN in the last 168 hours.  Invalid input(s): CK ------------------------------------------------------------------------------------------------------------------ Invalid input(s): POCBNP  ---------------------------------------------------------------------------------------------------------------  Urinalysis    Component Value Date/Time   COLORURINE STRAW (A) 12/04/2017 2316   APPEARANCEUR CLEAR (A) 12/04/2017 2316   APPEARANCEUR Clear 09/23/2012 1020   LABSPEC 1.025 12/04/2017 2316   LABSPEC 1.015 09/23/2012 1020   PHURINE 5.0 12/04/2017 2316   GLUCOSEU >=500 (A) 12/04/2017 2316   GLUCOSEU >=500 09/23/2012 1020   HGBUR NEGATIVE 12/04/2017 2316   BILIRUBINUR NEGATIVE 12/04/2017 2316   BILIRUBINUR Negative 09/23/2012 1020   KETONESUR NEGATIVE 12/04/2017 2316   PROTEINUR NEGATIVE 12/04/2017 2316   NITRITE NEGATIVE 12/04/2017 2316   LEUKOCYTESUR NEGATIVE 12/04/2017 2316   LEUKOCYTESUR Negative 09/23/2012 1020     RADIOLOGY: No results found.  EKG: Orders placed or performed during the hospital encounter of 11/17/18  . EKG 12-Lead immediately post procedure  . EKG 12-Lead  . EKG 12-Lead immediately post procedure  . EKG 12-Lead  . EKG 12-Lead    IMPRESSION AND PLAN:  *Coronary artery disease, unstable angina status post stent Drug-eluting stent is placed in the right coronary artery. Monitor on telemetry. Continue cardiac medications as advised by cardiologist.  *Hypertension Continue home meds.  *Chronic kidney disease stage II As this was scheduled outpatient procedure, no labs done today.  We need to follow on renal function.  *Diabetes mellitus We will hold oral medication but continue [place] and keep on sliding scale coverage.  *Hyperlipidemia Continue rosuvastatin.  All the records are reviewed and case discussed with ED provider. Management plans discussed with the patient, family and they are in agreement.  CODE  STATUS: Limited code    Code Status Orders  (From admission, onward)         Start     Ordered   11/17/18 1709  Limited resuscitation (code)  Continuous    Question Answer Comment  In the event of cardiac or respiratory ARREST: Initiate Code Blue, Call Rapid Response Yes   In the event of cardiac or respiratory ARREST: Perform CPR Yes   In the event of cardiac or respiratory ARREST: Perform Intubation/Mechanical Ventilation No   In the event of cardiac or respiratory ARREST: Use NIPPV/BiPAp only if indicated Yes   In the event of cardiac or respiratory ARREST: Administer ACLS medications if indicated Yes   In the event of cardiac or respiratory ARREST: Perform Defibrillation or Cardioversion if indicated Yes      11/17/18 1708        Code Status History    Date Active Date Inactive Code Status Order ID Comments User Context   11/17/2018 1539 11/17/2018 1708 Full Code [id number]  Alwyn Pea, MD Inpatient   02/04/2017 1838 02/05/2017 2039 Full Code [id number]  Auburn Bilberry, MD Inpatient     Patient told me he would like to have a trial of CPR and defibrillator use along with medications in adverse event but does not want to be intubated or kept on ventilator.  TOTAL TIME TAKING CARE OF THIS PATIENT: 45 minutes.    Altamese Dilling M.D on 11/17/2018   Between 7am to 6pm - Pager - [phone]  After 6pm go to [email or web address] - password [email or web address]  [facility] [place] [facility]  Office  [phone]  CC: Primary care physician; Margaretann LovelessKhan, Neelam S, MD   Note: This dictation was prepared with Dragon dictation along with smaller phrase technology. Any transcriptional errors that result from this process are unintentional.

## 2018-11-17 NOTE — Progress Notes (Addendum)
Pt received from Cath Lab. Dr. Juliann Pares paged to clarify when to start deflating TR band. Per Md to turn off Angiomax Gtt at 1645 and begin deflation at 1730. Pt resting VSS, right wrist CDI. Will monitor closely.

## 2018-11-18 DIAGNOSIS — Z955 Presence of coronary angioplasty implant and graft: Secondary | ICD-10-CM

## 2018-11-18 LAB — BASIC METABOLIC PANEL
Anion gap: 11 (ref 5–15)
BUN: 21 mg/dL (ref 8–23)
CO2: 23 mmol/L (ref 22–32)
Calcium: 8.8 mg/dL — ABNORMAL LOW (ref 8.9–10.3)
Chloride: 106 mmol/L (ref 98–111)
Creatinine, Ser: 1.09 mg/dL (ref 0.61–1.24)
GFR calc Af Amer: >60 mL/min (ref >60)
GFR calc non Af Amer: >60 mL/min (ref >60)
Glucose, Bld: 237 mg/dL — ABNORMAL HIGH (ref 70–99)
Potassium: 3.8 mmol/L (ref 3.5–5.1)
Sodium: 140 mmol/L (ref 135–145)

## 2018-11-18 LAB — LIPID PANEL
Cholesterol: 164 mg/dL (ref 0–200)
HDL: 63 mg/dL (ref >40)
LDL Cholesterol: 82 mg/dL (ref 0–99)
Total CHOL/HDL Ratio: 2.6 RATIO
Triglycerides: 97 mg/dL (ref <150)
VLDL: 19 mg/dL (ref 0–40)

## 2018-11-18 LAB — GLUCOSE, CAPILLARY
Glucose-Capillary: 188 mg/dL — ABNORMAL HIGH (ref 70–99)
Glucose-Capillary: 219 mg/dL — ABNORMAL HIGH (ref 70–99)
Glucose-Capillary: 306 mg/dL — ABNORMAL HIGH (ref 70–99)
Glucose-Capillary: 330 mg/dL — ABNORMAL HIGH (ref 70–99)
Glucose-Capillary: 272 mg/dL — ABNORMAL HIGH (ref 70–99)
Glucose-Capillary: 222 mg/dL — ABNORMAL HIGH (ref 70–99)
Glucose-Capillary: 241 mg/dL — ABNORMAL HIGH (ref 70–99)
Glucose-Capillary: 332 mg/dL — ABNORMAL HIGH (ref 70–99)
Glucose-Capillary: 231 mg/dL — ABNORMAL HIGH (ref 70–99)

## 2018-11-18 MED ORDER — INSULIN ASPART 100 UNIT/ML ~~LOC~~ SOLN: 0.0000 [IU] | Freq: Three times a day (TID) | SUBCUTANEOUS | Status: DC

## 2018-11-18 MED ORDER — ENOXAPARIN SODIUM 40 MG/0.4ML ~~LOC~~ SOLN
40.0000 mg | Freq: Two times a day (BID) | SUBCUTANEOUS | Status: DC
  Administered 2018-11-18: 40 mg via SUBCUTANEOUS
  Filled 2018-11-18: qty 0.4

## 2018-11-18 MED ORDER — INSULIN DEGLUDEC 200 UNIT/ML ~~LOC~~ SOPN: 160.0000 [IU] | PEN_INJECTOR | Freq: Every day | SUBCUTANEOUS | Status: DC

## 2018-11-18 MED ORDER — INSULIN ASPART 100 UNIT/ML ~~LOC~~ SOLN
0.0000 [IU] | Freq: Every day | SUBCUTANEOUS | Status: DC
  Administered 2018-11-18: 2 [IU] via SUBCUTANEOUS
  Filled 2018-11-18: qty 1

## 2018-11-18 NOTE — Progress Notes (Signed)
Patient ID: Derrick Barajas, male   DOB: [birth year], 63 y.o.   MRN: [id number]  [employer] PROGRESS NOTE  Derrick Barajas FVC:[id number] DOB: 18-Mar-1956 DOA: 11/17/2018 PCP: Margaretann Loveless, MD  HPI/Subjective: Patient feeling better than yesterday.  Was having symptoms of chest pain and shortness of breath prior to coming in.  Had a stent placed yesterday.  Objective: Vitals:   11/18/18 0508 11/18/18 0737  BP: 123/74 110/75  Pulse: 94 89  Resp:    Temp: 98.1 F (36.7 C) 97.9 F (36.6 C)  SpO2: 96% 98%    Filed Weights   11/17/18 1016 11/17/18 1957 11/18/18 0508  Weight: 124.7 kg 128.1 kg 128.1 kg    ROS: Review of Systems  Constitutional: Negative for chills and fever.  Eyes: Negative for blurred vision.  Respiratory: Negative for cough and shortness of breath.   Cardiovascular: Negative for chest pain.  Gastrointestinal: Negative for abdominal pain, constipation, diarrhea, nausea and vomiting.  Genitourinary: Negative for dysuria.  Musculoskeletal: Negative for joint pain.  Neurological: Negative for dizziness and headaches.   Exam: Physical Exam  Constitutional: He is oriented to person, place, and time.  HENT:  Nose: No mucosal edema.  Mouth/Throat: No oropharyngeal exudate or posterior oropharyngeal edema.  Eyes: Pupils are equal, round, and reactive to light. Conjunctivae, EOM and lids are normal.  Neck: No JVD present. Carotid bruit is not present. No edema present. No thyroid mass and no thyromegaly present.  Cardiovascular: S1 normal and S2 normal. Exam reveals no gallop.  No murmur heard. Pulses:      Dorsalis pedis pulses are 2+ on the right side and 2+ on the left side.  Respiratory: No respiratory distress. He has no wheezes. He has no rhonchi. He has no rales.  GI: Soft. Bowel sounds are normal. There is no abdominal tenderness.  Musculoskeletal:     Right ankle: He exhibits swelling.     Left ankle: He exhibits swelling.  Lymphadenopathy:    He has  no cervical adenopathy.  Neurological: He is alert and oriented to person, place, and time. No cranial nerve deficit.  Skin: Skin is warm. No rash noted. Nails show no clubbing.  Psychiatric: He has a normal mood and affect.      Data Reviewed: Basic Metabolic Panel: Recent Labs  Lab 11/18/18 0352  NA 140  K 3.8  CL 106  CO2 23  GLUCOSE 237*  BUN 21  CREATININE 1.09  CALCIUM 8.8*    CBG: Recent Labs  Lab 11/17/18 1539 11/17/18 2021 11/18/18 0738 11/18/18 1058 11/18/18 1259  GLUCAP 71 348* 188* 219* 306*    Recent Results (from the past 240 hour(s))  Novel Coronavirus, NAA (hospital order; send-out to ref lab)     Status: None   Collection Time: 11/14/18  1:09 PM  Result Value Ref Range Status   SARS-CoV-2, NAA NOT DETECTED NOT DETECTED Final    Comment: (NOTE) This test was developed and its performance characteristics determined by [employer]. This test has not been FDA cleared or approved. This test has been authorized by FDA under an Emergency Use Authorization (EUA). This test is only authorized for the duration of time the declaration that circumstances exist justifying the authorization of the emergency use of in vitro diagnostic tests for detection of SARS-CoV-2 virus and/or diagnosis of COVID-19 infection under section 564(b)(1) of the Act, 21 U.S.C. 638GYK-5(L)(9), unless the authorization is terminated or revoked sooner. When diagnostic testing is negative, the possibility of a  false negative result should be considered in the context of a patient's recent exposures and the presence of clinical signs and symptoms consistent with COVID-19. An individual without symptoms of COVID-19 and who is not shedding SARS-CoV-2 virus would expect to have a negative (not detected) result in this assay. Performed  At: [facility] [address] [place], [place] [id number] Jolene Schimke MD OM:[id number]    Coronavirus Source NASOPHARYNGEAL   Final    Comment: Performed at [facility], [address] [place], [place] [address]     Scheduled Meds: . amLODipine  10 mg Oral Daily  . aspirin  81 mg Oral Daily  . docusate sodium  100 mg Oral QHS  . enoxaparin (LOVENOX) injection  40 mg Subcutaneous Q12H  . ezetimibe  10 mg Oral BID  . fluticasone  1 spray Each Nare Daily  . furosemide  20 mg Oral BID  . isosorbide mononitrate  60 mg Oral Daily  . levothyroxine  125 mcg Oral Q0600  . metoprolol succinate  100 mg Oral Daily  . olopatadine  1 drop Both Eyes Daily  . prasugrel  10 mg Oral Daily  . ramipril  10 mg Oral Daily  . ranolazine  500 mg Oral BID  . rosuvastatin  40 mg Oral QPM  . sodium chloride flush  3 mL Intravenous Q12H   Continuous Infusions: . sodium chloride      Assessment/Plan:  1. Type 2 diabetes mellitus.  Patient was given 160 mg of Lantus last night.  I thought it was ordered on daily dosing regimen.  The patient received another 160 mg of Lantus this morning.  I was message from the diabetes coordinator that the patient was given 2 doses of high-dose Lantus in a short period of time.  We will check fingersticks every 2 hours.  High risk for hypoglycemia potentially from 3 to 6 PM.  We will watch another night in the hospital to make sure he does not have any hypoglycemic episodes overnight.  I held all diabetic medications at this point. 2. Acute coronary syndrome with a drug-eluting stent placed on the right coronary artery.  Continue aspirin, Effient beta-blocker and Crestor 3. Hypertension.  Continue usual meds blood pressure is controlled 4. Chronic kidney disease stage II 5. Morbid obesity with a BMI of 44.23.  Weight loss needed 6. Hyperlipidemia unspecified on Crestor 7. Hypothyroidism unspecified on levothyroxine  Code Status:     Code Status Orders  (From admission, onward)         Start     Ordered   11/17/18 1709  Limited resuscitation (code)  Continuous    Question Answer  Comment  In the event of cardiac or respiratory ARREST: Initiate Code Blue, Call Rapid Response Yes   In the event of cardiac or respiratory ARREST: Perform CPR Yes   In the event of cardiac or respiratory ARREST: Perform Intubation/Mechanical Ventilation No   In the event of cardiac or respiratory ARREST: Use NIPPV/BiPAp only if indicated Yes   In the event of cardiac or respiratory ARREST: Administer ACLS medications if indicated Yes   In the event of cardiac or respiratory ARREST: Perform Defibrillation or Cardioversion if indicated Yes      11/17/18 1708        Code Status History    Date Active Date Inactive Code Status Order ID Comments User Context   11/17/2018 1539 11/17/2018 1708 Full Code [id number]  Alwyn Pea, MD Inpatient   02/04/2017  1838 02/05/2017 2039 Full Code [id number]  Auburn Bilberry, MD Inpatient      Disposition Plan: Home tomorrow  Consultants:  Cardiology  Time spent: 32 minutes    Layanna Charo [employer]

## 2018-11-18 NOTE — Plan of Care (Signed)
  Problem: Clinical Measurements: Goal: Will remain free from infection Outcome: Progressing Goal: Respiratory complications will improve Outcome: Progressing Note:  Room air   Problem: Activity: Goal: Risk for activity intolerance will decrease Outcome: Progressing Note:  Up with standby assist tolerating well   Problem: Coping: Goal: Level of anxiety will decrease Outcome: Progressing   Problem: Elimination: Goal: Will not experience complications related to urinary retention Outcome: Progressing   Problem: Pain Managment: Goal: General experience of comfort will improve Outcome: Progressing Note:  No complaints of pain this shift   Problem: Safety: Goal: Ability to remain free from injury will improve Outcome: Progressing   Problem: Cardiovascular: Goal: Vascular access site(s) Level 0-1 will be maintained Outcome: Progressing Note:  Right radial site at a level 0, no bleeding/bruising    Problem: Education: Goal: Knowledge of General Education information will improve Description Including pain rating scale, medication(s)/side effects and non-pharmacologic comfort measures Outcome: Completed/Met   Problem: Nutrition: Goal: Adequate nutrition will be maintained Outcome: Completed/Met
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
## 2018-11-18 NOTE — Discharge Instructions (Signed)
Acute Coronary Syndrome    Acute coronary syndrome (ACS) is a serious problem in which there is suddenly not enough blood and oxygen reaching the heart. ACS can result in chest pain or a heart attack.  This condition is a medical emergency. If you have any symptoms of this condition, get help right away.  What are the causes?  This condition may be caused by:   Buildup of fat and cholesterol inside of the arteries (atherosclerosis). This is the most common cause. The buildup (plaque) can cause blood vessels in the heart (coronary arteries) to become narrow or blocked, which reduces blood flow to the heart. Plaque can also break off and lead to a clot, which can block an artery and cause a heart attack or stroke.   Sudden tightening of the muscles around the coronary arteries (coronary spasm).   Tearing of a coronary artery (spontaneous coronary artery dissection).   Very low blood pressure (hypotension).   An abnormal heartbeat (arrhythmia).   Other medical conditions that cause a decrease of oxygen to the heart, such as anemiaorrespiratory failure.   Using cocaine or methamphetamine.  What increases the risk?  The following factors may make you more likely to develop this condition:   Age. The risk for ACS increases as you get older.   History of chest pain, heart attack, peripheral artery disease, or stroke.   Having taken chemotherapy or immune-suppressing medicines.   Being male.   Family history of chest pain, heart disease, or stroke.   Smoking.   Not exercising enough.   Being overweight.   High cholesterol.   High blood pressure (hypertension).   Diabetes.   Excessive alcohol use.  What are the signs or symptoms?  Common symptoms of this condition include:   Chest pain. The pain may last a long time, or it may stop and come back (recur). It may feel like:  ? Crushing or squeezing.  ? Tightness, pressure, fullness, or heaviness.   Arm, neck, jaw, or back pain.   Heartburn or  indigestion.   Shortness of breath.   Nausea.   Sudden cold sweats.   Light-headedness.   Dizziness, or passing out.   Tiredness (fatigue).  Sometimes there are no symptoms.  How is this diagnosed?  This condition may be diagnosed based on:   Your medical history and symptoms.   An electrocardiogram (ECG). This imaging test measures the heart's electrical activity.   Blood tests. Cardiac blood tests may need to be repeated at designated time intervals.   Chest X-ray.   A CT scan of the chest.   A coronary angiogram. This is a procedure in which dye is injected into the bloodstream and then X-rays are taken to show if there is a blockage in a coronary artery.   Exercise stress testing.   Echocardiography. This is a test that uses sound waves to produce detailed images of the heart.  How is this treated?  The treatment is to restore blood flow to the heart as soon as possible. Treatment for this condition may include:   Oxygen therapy.   Medicines, such as:  ? Antiplatelet medicines and blood-thinning medicines, such as aspirin. These help prevent blood clots.  ? Medicine that dissolves any blood clots (fibrinolytic therapy).  ? Blood pressure medicines.  ? Nitroglycerin. This helps relieve chest pain and widens blood vessels to improve blood flow.  ? Pain medicine.  ? Cholesterol-lowering medicine.   Surgery, such as:  ? Coronary angioplasty with   stent placement. This involves placing a small piece of metal that looks like mesh or a spring into a narrow coronary artery. This widens the artery and keep it open.  ? Coronary artery bypass surgery. This involves taking a section of a blood vessel from a different part of your body, and placing it on the blocked coronary artery to allow blood to flow around (bypass) the blockage.   Cardiac rehabilitation. This is a program that helps improve your health and well-being. It includes exercise training, education, and counseling to help you recover.  Follow  these instructions at home:  Eating and drinking   Eat a heart-healthy diet that includes whole grains, fruits and vegetables, lean proteins, and low-fat or nonfat dairy products.   Limit how much salt (sodium) you eat as told by your health care provider. Follow instructions from your health care provider about any other eating or drinking restrictions, such as limiting foods that are high in fat and processed sugars.   Use healthy cooking methods such as roasting, grilling, broiling, baking, poaching, steaming, or stir-frying.   Talk with a dietitian to learn about healthy cooking methods and how to eat less sodium.  Medicines   Take over-the-counter and prescription medicines only as told by your health care provider.   Do not take these medicines unless your health care provider approves:  ? Vitamin supplements that contain vitamin A or vitamin E.  ? Nonsteroidal anti-inflammatory drugs (NSAIDs), such as ibuprofen, naproxen, or celecoxib.  ? Hormone replacement therapy that contains estrogen.  If you are taking blood thinners:   Talk with your health care provider before you take any medicines that contain aspirin or NSAIDs. These medicines increase your risk for dangerous bleeding.   Take your medicine exactly as told, at the same time every day.   Avoid activities that could cause injury or bruising, and follow instructions about how to prevent falls.   Wear a medical alert bracelet, and carry a card that lists what medicines you take.  Activity   Join a cardiac rehabilitation program. An exercise plan will be developed for you.   Ask your health care provider:  ? What activities and exercises are safe for you.  ? If you should follow specific instructions about lifting, driving, or climbing stairs.  Lifestyle   Do not use any products that contain nicotine or tobacco, such as cigarettes and e-cigarettes. If you need help quitting, ask your health care provider.   If your health care provider  says that alcohol is safe for you, limit your alcohol intake to no more than 1 drink a day. One drink equals 12 oz of beer, 5 oz of wine, or 1 oz of hard liquor.   Maintain a healthy weight. If you need to lose weight, work with your health care provider to do so safely.  General instructions   Tell all the health care providers who care for you about your heart condition, including your dentist. This may affect the medicines or treatment you receive.   Manage any other health conditions you have, such as hypertension or diabetes. These conditions affect your heart.   Learn ways to manage stress.   Get screened for depression, and get mental health treatment if you need it. People with ACS are at higher risk for depression.   Keep your vaccinations up to date. Get the flu shot (influenza vaccine) every year.   If directed, monitor your blood pressure at home.   Keep all   follow-up visits as told by your health care provider. This is important.  Contact a health care provider if:   You feel overwhelmed or sad.   You have trouble doing your daily activities.  Get help right away if:   You have pain in your chest, neck, arm, jaw, stomach, or back that recurs, and:  ? It lasts for more than a few minutes.  ? It is not relieved by taking the medicineyour health care provider prescribed.   You have unexplained:  ? Heavy sweating.  ? Heartburn or indigestion.  ? Nausea or vomiting.  ? Shortness of breath.  ? Difficulty breathing.  ? Fatigue.  ? Nervousness or anxiety.  ? Weakness.  ? Diarrhea.  ? Dark stools or blood in your stool.   You have sudden light-headedness or dizziness.   Your blood pressure is higher than 180/120.   You faint.   You have thoughts about hurting yourself.  These symptoms may represent a serious problem that is an emergency. Do not wait to see if the symptoms will go away. Get medical help right away. Call your local emergency services (911 in the [place] Do not drive yourself to the  hospital.  If you ever feel like you may hurt yourself or others, or have thoughts about taking your own life, get help right away. You can go to your nearest emergency department or call:   Emergency services (911 in the [place]   A suicide crisis helpline, such as the National Suicide Prevention Lifeline at 1-800-273-8255. This is open 24 hours a day.  Summary   Acute coronary syndrome (ACS) is when there is not enough blood and oxygen being supplied to the heart. ACS can result in chest pain or a heart attack.   Acute coronary syndrome is a medical emergency. If you have any symptoms of this condition, get help right away.   Treatment includes medicines and procedures to open the blocked arteries and restore blood flow.  This information is not intended to replace advice given to you by your health care provider. Make sure you discuss any questions you have with your health care provider.  Document Released: 06/25/2005 Document Revised: 03/05/2017 Document Reviewed: 03/05/2017  [employer]vier Interactive Patient Education  2019 [employer]

## 2018-11-18 NOTE — Progress Notes (Signed)
SUBJECTIVE: no cp   Vitals:   11/17/18 1957 11/17/18 2339 11/18/18 0508 11/18/18 0737  BP: (!) 163/79 (!) 162/104 123/74 110/75  Pulse: (!) 103 (!) 112 94 89  Resp: 18     Temp: 98 F (36.7 C)  98.1 F (36.7 C) 97.9 F (36.6 C)  TempSrc: Oral  Oral Oral  SpO2: 98% 95% 96% 98%  Weight: 128.1 kg  128.1 kg   Height: 5\' 7"  (1.702 m)       Intake/Output Summary (Last 24 hours) at 11/18/2018 1046 Last data filed at 11/18/2018 4996 Gross per 24 hour  Intake 1087.79 ml  Output 3050 ml  Net -1962.21 ml    LABS: Basic Metabolic Panel: Recent Labs    11/18/18 0352  NA 140  K 3.8  CL 106  CO2 23  GLUCOSE 237*  BUN 21  CREATININE 1.09  CALCIUM 8.8*   Liver Function Tests: No results for input(s): AST, ALT, ALKPHOS, BILITOT, PROT, ALBUMIN in the last 72 hours. No results for input(s): LIPASE, AMYLASE in the last 72 hours. CBC: No results for input(s): WBC, NEUTROABS, HGB, HCT, MCV, PLT in the last 72 hours. Cardiac Enzymes: No results for input(s): CKTOTAL, CKMB, CKMBINDEX, TROPONINI in the last 72 hours. BNP: Invalid input(s): POCBNP D-Dimer: No results for input(s): DDIMER in the last 72 hours. Hemoglobin A1C: No results for input(s): HGBA1C in the last 72 hours. Fasting Lipid Panel: Recent Labs    11/18/18 0352  CHOL 164  HDL 63  LDLCALC 82  TRIG 97  CHOLHDL 2.6   Thyroid Function Tests: No results for input(s): TSH, T4TOTAL, T3FREE, THYROIDAB in the last 72 hours.  Invalid input(s): FREET3 Anemia Panel: No results for input(s): VITAMINB12, FOLATE, FERRITIN, TIBC, IRON, RETICCTPCT in the last 72 hours.   PHYSICAL EXAM General: Well developed, well nourished, in no acute distress HEENT:  Normocephalic and atramatic Neck:  No JVD.  Lungs: Clear bilaterally to auscultation and percussion. Heart: HRRR . Normal S1 and S2 without gallops or murmurs.  Abdomen: Bowel sounds are positive, abdomen soft and non-tender  Msk:  Back normal, normal gait. Normal  strength and tone for age. Extremities: No clubbing, cyanosis or edema.   Neuro: Alert and oriented X 3. Psych:  Good affect, responds appropriately  TELEMETRY: NSR  ASSESSMENT AND PLAN:S/p PCI RCA. May go hoe f/u Friday 10  Principal Problem:   Unstable angina (HCC) Active Problems:   S/P drug eluting coronary stent placement    Macedonio Scallon A, MD, [facility] 11/18/2018 10:46 AM

## 2018-11-18 NOTE — Progress Notes (Signed)
Inpatient Diabetes Program Recommendations  AACE/ADA: New Consensus Statement on Inpatient Glycemic Control   Target Ranges:  Prepandial:   less than 140 mg/dL      Peak postprandial:   less than 180 mg/dL (1-2 hours)      Critically ill patients:  140 - 180 mg/dL   Results for MYKAEL, DEPENA (MRN [id number]) as of 11/18/2018 10:32  Ref. Range 11/17/2018 10:03 11/17/2018 12:59 11/17/2018 15:39 11/17/2018 20:21 11/18/2018 07:38 11/18/2018 8:48  Glucose-Capillary Latest Ref Range: 70 - 99 mg/dL 76 54 (L) 71 177 (H)  Lantus 160 units @ 21:57 188 (H)   Lantus 160 units @ 8:48   Review of Glycemic Control  Diabetes history: DM2 Outpatient Diabetes medications: Tresiba 160 units daily, Trulicity 1.5 mg Qweek, Invokana 300 mg daily Current orders for Inpatient glycemic control: Lantus 160 units daily, Novolog 0-9 units TID with meals, Novolog 0-5 units QHS  Inpatient Diabetes Program Recommendations:   Insulin - Basal: Noted patient received Lantus 160 units at 21:57 on 11/17/18 and has already received Lantus 160 units today at 8:48 am since Lantus is ordered as daily. Concerned about hypoglycemia since patient has received a total of Lantus 320 units within a 12 hour time frame. Will make RN and MD aware.  NOTE: Patient has DM2 hx and is followed by Dr. Tedd Sias and Mollie Germany, NP (last seen Mollie Germany, NP on 10/02/18). Patient is prescribed Tresiba 160 units daily, Trulicity 1.5 mg Qweek (Wednesday), and Invokana 300 mg daily. In reviewing chart, noted patient received Lantus 160 units last night at 21:57 and has already received Lantus 160 units today at 8:48 am. Concerned about hypoglycemia due to patient receiving a total of Lantus 320 units within 12 hour time frame. Sent secure chat message to Dr. Renae Gloss and J. Christmas, RN at 10:30 and called unit and spoke with J. Christmas, RN as well to inform RN of situation and concern for hypoglycemia.   Thanks, Orlando Penner, RN, MSN, CDE Diabetes  Coordinator Inpatient Diabetes Program [phone] (Team Pager from 8am to 5pm)

## 2018-11-18 NOTE — Progress Notes (Addendum)
Lantus 160 units Daily ordered/ RN administered 160 units of Lantus at 8:48am as ordered ( pt stated that he takes his daily insulin in the morning) / noted by Gavin Pound, Diabetes Coordinator that pt received 160 units of Lantus at 21:57 (5/11) Daily Lantus administration not adjusted by Pharmacy causing pt to receive too much insulin /  Dr. Renae Gloss aware/ will monitor pt closely- orders to check FSBS every 2hrs/ insulin orders discontinued for now/ informed pt of s/s of low blood sugars /pt states he can tell when his blood sugars are low/ pt informed to report to staff if he feels like his blood sugar is low/ verbalized an understanding/ juice and peanut butter crackers at pts bedside/ also spoke with Pharmacist, Kevin Fenton about when peak amount of inulin could possible effect pt/ will continue to monitor frequently.

## 2018-11-19 ENCOUNTER — Encounter: Payer: Self-pay | Admitting: Cardiovascular Disease

## 2018-11-19 LAB — GLUCOSE, CAPILLARY
Glucose-Capillary: 155 mg/dL — ABNORMAL HIGH (ref 70–99)
Glucose-Capillary: 161 mg/dL — ABNORMAL HIGH (ref 70–99)
Glucose-Capillary: 164 mg/dL — ABNORMAL HIGH (ref 70–99)
Glucose-Capillary: 168 mg/dL — ABNORMAL HIGH (ref 70–99)
Glucose-Capillary: 236 mg/dL — ABNORMAL HIGH (ref 70–99)
Glucose-Capillary: 99 mg/dL (ref 70–99)

## 2018-11-19 MED ORDER — INSULIN GLARGINE 100 UNIT/ML ~~LOC~~ SOLN
75.0000 [IU] | Freq: Once | SUBCUTANEOUS | Status: AC
  Administered 2018-11-19: 75 [IU] via SUBCUTANEOUS
  Filled 2018-11-19: qty 0.75

## 2018-11-19 MED ORDER — INSULIN GLARGINE 100 UNIT/ML ~~LOC~~ SOLN
150.0000 [IU] | Freq: Once | SUBCUTANEOUS | Status: DC
  Filled 2018-11-19: qty 1.5

## 2018-11-19 NOTE — Discharge Summary (Signed)
[employer] - [place] at [facility]   PATIENT NAME: Derrick Barajas    MR#:  [id number]  DATE OF BIRTH:  [birth year]  DATE OF ADMISSION:  11/17/2018 ADMITTING PHYSICIAN: Alwyn Pea, MD  DATE OF DISCHARGE: 11/19/2018 11:33 AM  PRIMARY CARE PHYSICIAN: Dr. Ellsworth Lennox   ADMISSION DIAGNOSIS:  S/P drug eluting coronary stent placement [Z95.5]  DISCHARGE DIAGNOSIS:  Principal Problem:   Unstable angina (HCC) Active Problems:   S/P drug eluting coronary stent placement   SECONDARY DIAGNOSIS:   Past Medical History:  Diagnosis Date  . CAD (coronary artery disease)   . CHF (congestive heart failure) (HCC)   . Diabetes mellitus without complication (HCC)   . Gout   . Hyperlipidemia   . Hypertension   . MI (myocardial infarction) (HCC)   . RA (rheumatoid arthritis) (HCC)   . Sleep apnea   . 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 Thyroid disease     HOSPITAL COURSE:   1.  Acute coronary syndrome.  The patient was brought in for an elective cardiac catheterization and a drug-eluting stent was placed in the right coronary artery.  The patient was continued on usual medications of aspirin Effient beta-blocker and Crestor. 2.  Type 2 diabetes mellitus.  The patient was watched an extra night in the hospital because the patient received a nighttime dose of Lantus and then a morning dose of Lantus we were concerned and watched for hypoglycemia but the patient did not have any episodes of hypoglycemia.  On the day of discharge her sugar was low but not too low.  We cut back on the Lantus dose in the morning prior to discharge and he can go back on his usual medications as outpatient starting tomorrow. 3. Hypertension.  Blood pressure stable continue usual medications. 4.  Chronic kidney disease stage II 5.  Obesity with a BMI of 44.81.  Weight loss needed 6.  Hyperlipidemia unspecified on Crestor 7.  Hypothyroidism unspecified on levothyroxine   DISCHARGE CONDITIONS:    Satisfactory  CONSULTS OBTAINED:  Treatment Team:  Altamese Dilling, MD Laurier Nancy, MD  DRUG ALLERGIES:   Allergies  Allergen Reactions  . Shellfish-Derived Products Other (See Comments)    Other reaction(s): Nausea/Vomiting/Diarrhea, Shock/Unconsciousness  . Sulfa Antibiotics Other (See Comments)    Other reaction(s): Nausea/Vomiting/Diarrhea, Shock/Unconsciousness  . Iodine Nausea And Vomiting  . Atorvastatin Other (See Comments)  . Iodinated Diagnostic Agents Other (See Comments)  . Nsaids Other (See Comments)    D/t cardiac meds  . Shrimp [Shellfish Allergy] Nausea And Vomiting    DISCHARGE MEDICATIONS:   Allergies as of 11/19/2018      Reactions   Shellfish-derived Products Other (See Comments)   Other reaction(s): Nausea/Vomiting/Diarrhea, Shock/Unconsciousness   Sulfa Antibiotics Other (See Comments)   Other reaction(s): Nausea/Vomiting/Diarrhea, Shock/Unconsciousness   Iodine Nausea And Vomiting   Atorvastatin Other (See Comments)   Iodinated Diagnostic Agents Other (See Comments)   Nsaids Other (See Comments)   D/t cardiac meds   Shrimp [shellfish Allergy] Nausea And Vomiting      Medication List    TAKE these medications   Accu-Chek Aviva Plus test strip Generic drug:  glucose blood TEST TID   amLODipine 10 MG tablet Commonly known as:  NORVASC Take 10 mg by mouth daily.   aspirin EC 81 MG tablet Take 81 mg by mouth daily.   B-D ULTRAFINE III SHORT PEN 31G X 8 MM Misc Generic drug:  Insulin Pen Needle  USE UTD BID   docusate sodium 100 MG capsule Commonly known as:  COLACE Take 100 mg at bedtime by mouth.   ezetimibe 10 MG tablet Commonly known as:  ZETIA Take 10 mg by mouth 2 (two) times daily.   fluticasone 50 MCG/ACT nasal spray Commonly known as:  FLONASE Place 1 spray into both nostrils daily.   furosemide 20 MG tablet Commonly known as:  LASIX Take 20 mg by mouth 2 (two) times daily.   Invokana 300 MG Tabs  tablet Generic drug:  canagliflozin Take 300 mg by mouth daily.   isosorbide mononitrate 60 MG 24 hr tablet Commonly known as:  IMDUR Take 60 mg daily by mouth.   levothyroxine 125 MCG tablet Commonly known as:  SYNTHROID Take 125 mcg by mouth daily before breakfast.   metoprolol succinate 100 MG 24 hr tablet Commonly known as:  TOPROL-XL Take 100 mg daily by mouth. Take with or immediately following a meal.   olopatadine 0.1 % ophthalmic solution Commonly known as:  PATANOL Place 1 drop into both eyes daily.   prasugrel 10 MG Tabs tablet Commonly known as:  EFFIENT Take 10 mg by mouth daily at 3 pm.   ramipril 10 MG capsule Commonly known as:  ALTACE Take 10 mg by mouth daily.   ranolazine 500 MG 12 hr tablet Commonly known as:  RANEXA Take 500 mg by mouth 2 (two) times daily.   rosuvastatin 40 MG tablet Commonly known as:  CRESTOR Take 40 mg by mouth every evening.   Evaristo Buryresiba FlexTouch 200 UNIT/ML Sopn Generic drug:  Insulin Degludec Inject 160 Units into the skin daily. What changed:  Another medication with the same name was removed. Continue taking this medication, and follow the directions you see here.   Trulicity 1.5 MG/0.5ML Sopn Generic drug:  Dulaglutide Inject 1.5 mg into the skin every Wednesday.        DISCHARGE INSTRUCTIONS:   Follow-up cardiology on Friday Follow-up PMD 1 week  If you experience worsening of your admission symptoms, develop shortness of breath, life threatening emergency, suicidal or homicidal thoughts you must seek medical attention immediately by calling 911 or calling your MD immediately  if symptoms less severe.  You Must read complete instructions/literature along with all the possible adverse reactions/side effects for all the Medicines you take and that have been prescribed to you. Take any new Medicines after you have completely understood and accept all the possible adverse reactions/side effects.   Please note  You  were cared for by a hospitalist during your hospital stay. If you have any questions about your discharge medications or the care you received while you were in the hospital after you are discharged, you can call the unit and asked to speak with the hospitalist on call if the hospitalist that took care of you is not available. Once you are discharged, your primary care physician will handle any further medical issues. Please note that NO REFILLS for any discharge medications will be authorized once you are discharged, as it is imperative that you return to your primary care physician (or establish a relationship with a primary care physician if you do not have one) for your aftercare needs so that they can reassess your need for medications and monitor your lab values.    Today   CHIEF COMPLAINT:  Had a cardiac stent  HISTORY OF PRESENT ILLNESS:  Elige KoGarry Liddy  is a 63 y.o. male with a known history of CAD presented after a  cardiac catheterization and stent.   VITAL SIGNS:  Blood pressure 134/84, pulse 69, temperature 97.8 F (36.6 C), temperature source Oral, resp. rate 20, height 5\' 7"  (1.702 m), weight 129.8 kg, SpO2 99 %.  I/O:    Intake/Output Summary (Last 24 hours) at 11/19/2018 1334 Last data filed at 11/19/2018 0924 Gross per 24 hour  Intake 243 ml  Output 1250 ml  Net -1007 ml    PHYSICAL EXAMINATION:  GENERAL:  63 y.o.-year-old patient lying in the bed with no acute distress.  EYES: Pupils equal, round, reactive to light and accommodation. No scleral icterus. Extraocular muscles intact.  HEENT: Head atraumatic, normocephalic. Oropharynx and nasopharynx clear.  NECK:  Supple, no jugular venous distention. No thyroid enlargement, no tenderness.  LUNGS: Normal breath sounds bilaterally, no wheezing, rales,rhonchi or crepitation. No use of accessory muscles of respiration.  CARDIOVASCULAR: S1, S2 normal. No murmurs, rubs, or gallops.  ABDOMEN: Soft, non-tender, non-distended.  Bowel sounds present. No organomegaly or mass.  EXTREMITIES: Trace pedal edema, no cyanosis, or clubbing.  NEUROLOGIC: Cranial nerves II through XII are intact. Muscle strength 5/5 in all extremities. Sensation intact. Gait not checked.  PSYCHIATRIC: The patient is alert and oriented x 3.  SKIN: No obvious rash, lesion, or ulcer.   DATA REVIEW:    Chemistries  Recent Labs  Lab 11/18/18 0352  NA 140  K 3.8  CL 106  CO2 23  GLUCOSE 237*  BUN 21  CREATININE 1.09  CALCIUM 8.8*    Microbiology Results  Results for orders placed or performed during the hospital encounter of 11/14/18  Novel Coronavirus, NAA (hospital order; send-out to ref lab)     Status: None   Collection Time: 11/14/18  1:09 PM  Result Value Ref Range Status   SARS-CoV-2, NAA NOT DETECTED NOT DETECTED Final    Comment: (NOTE) This test was developed and its performance characteristics determined by [employer]. This test has not been FDA cleared or approved. This test has been authorized by FDA under an Emergency Use Authorization (EUA). This test is only authorized for the duration of time the declaration that circumstances exist justifying the authorization of the emergency use of in vitro diagnostic tests for detection of SARS-CoV-2 virus and/or diagnosis of COVID-19 infection under section 564(b)(1) of the Act, 21 U.S.C. 500XFG-1(W)(2), unless the authorization is terminated or revoked sooner. When diagnostic testing is negative, the possibility of a false negative result should be considered in the context of a patient's recent exposures and the presence of clinical signs and symptoms consistent with COVID-19. An individual without symptoms of COVID-19 and who is not shedding SARS-CoV-2 virus would expect to have a negative (not detected) result in this assay. Performed  At: [facility] [address] [place], [place] [id number] Jolene Schimke MD EL:[id number]    Coronavirus Source  NASOPHARYNGEAL  Final    Comment: Performed at [facility], [address] [place] [place], [place] [address]      Management plans discussed with the patient, family and they are in agreement.  CODE STATUS:     Code Status Orders  (From admission, onward)         Start     Ordered   11/17/18 1709  Limited resuscitation (code)  Continuous    Question Answer Comment  In the event of cardiac or respiratory ARREST: Initiate Code Blue, Call Rapid Response Yes   In the event of cardiac or respiratory ARREST: Perform CPR Yes   In the event  of cardiac or respiratory ARREST: Perform Intubation/Mechanical Ventilation No   In the event of cardiac or respiratory ARREST: Use NIPPV/BiPAp only if indicated Yes   In the event of cardiac or respiratory ARREST: Administer ACLS medications if indicated Yes   In the event of cardiac or respiratory ARREST: Perform Defibrillation or Cardioversion if indicated Yes      11/17/18 1708        Code Status History    Date Active Date Inactive Code Status Order ID Comments User Context   11/17/2018 1539 11/17/2018 1708 Full Code [id number]  Alwyn Pea, MD Inpatient   02/04/2017 1838 02/05/2017 2039 Full Code [id number]  Auburn Bilberry, MD Inpatient      TOTAL TIME TAKING CARE OF THIS PATIENT: 35 minutes.    Alford Highland M.D on 11/19/2018 at 1:34 PM  Between 7am to 6pm - Pager - [phone]  After 6pm go to [email or web address] - password [email or web address]  [employer] Office  [phone]  CC: Primary care physician; Dr Ellsworth Lennox

## 2018-11-19 NOTE — TOC Transition Note (Signed)
Transition of Care [facility]) - CM/SW Discharge Note   Patient Details  Name: Derrick Barajas MRN: [id number] Date of Birth: September 17, 1955  [facility]) CM/SW Contact:  Sherren Kerns, RN Phone Number: 11/19/2018, 10:37 AM   Clinical Narrative:   Patient is discharging today.  From home alone and independent.  Current with PCP; obtains medications through mail order with [facility].  He drives and his car is at home.  Does not have a way home today.  Provided taxi voucher for DC.  No further need identified.      Final next level of care: Home/Self Care Barriers to Discharge: No Barriers Identified   Patient Goals and CMS Choice        Discharge Placement                       Discharge Plan and Services                                     Social Determinants of Health (SDOH) Interventions     Readmission Risk Interventions No flowsheet data found.

## 2018-11-19 NOTE — Plan of Care (Signed)
Blood sugars checked q2hrs ranging from 332-155.  Problem: Clinical Measurements: Goal: Respiratory complications will improve Outcome: Progressing   Problem: Activity: Goal: Risk for activity intolerance will decrease Outcome: Progressing   Problem: Elimination: Goal: Will not experience complications related to bowel motility Outcome: Progressing Goal: Will not experience complications related to urinary retention Outcome: Progressing   Problem: Pain Managment: Goal: General experience of comfort will improve Outcome: Progressing Note:  No complaints of pain this shift   Problem: Safety: Goal: Ability to remain free from injury will improve Outcome: Progressing   Problem: Skin Integrity: Goal: Risk for impaired skin integrity will decrease Outcome: Progressing   Problem: Cardiovascular: Goal: Vascular access site(s) Level 0-1 will be maintained Outcome: Progressing Note:  Right radial cath site level 0 no bleeding or bruising     Problem: Coping: Goal: Level of anxiety will decrease Outcome: Completed/Met

## 2018-11-19 NOTE — Progress Notes (Signed)
SUBJECTIVE: Feeling well, no CP or shortness of breath.   Vitals:   11/18/18 1635 11/18/18 1943 11/19/18 0332 11/19/18 0820  BP: 119/67 130/73 134/85 134/84  Pulse: 79 76 73 69  Resp:  20 18 20   Temp: (!) 97.4 F (36.3 C) (!) 97.5 F (36.4 C) 97.7 F (36.5 C) 97.8 F (36.6 C)  TempSrc:  Oral Oral Oral  SpO2: 99% 100% 98% 99%  Weight:   129.8 kg   Height:        Intake/Output Summary (Last 24 hours) at 11/19/2018 0952 Last data filed at 11/19/2018 0924 Gross per 24 hour  Intake 243 ml  Output 1250 ml  Net -1007 ml    LABS: Basic Metabolic Panel: Recent Labs    11/18/18 0352  NA 140  K 3.8  CL 106  CO2 23  GLUCOSE 237*  BUN 21  CREATININE 1.09  CALCIUM 8.8*   Liver Function Tests: No results for input(s): AST, ALT, ALKPHOS, BILITOT, PROT, ALBUMIN in the last 72 hours. No results for input(s): LIPASE, AMYLASE in the last 72 hours. CBC: No results for input(s): WBC, NEUTROABS, HGB, HCT, MCV, PLT in the last 72 hours. Cardiac Enzymes: No results for input(s): CKTOTAL, CKMB, CKMBINDEX, TROPONINI in the last 72 hours. BNP: Invalid input(s): POCBNP D-Dimer: No results for input(s): DDIMER in the last 72 hours. Hemoglobin A1C: No results for input(s): HGBA1C in the last 72 hours. Fasting Lipid Panel: Recent Labs    11/18/18 0352  CHOL 164  HDL 63  LDLCALC 82  TRIG 97  CHOLHDL 2.6   Thyroid Function Tests: No results for input(s): TSH, T4TOTAL, T3FREE, THYROIDAB in the last 72 hours.  Invalid input(s): FREET3 Anemia Panel: No results for input(s): VITAMINB12, FOLATE, FERRITIN, TIBC, IRON, RETICCTPCT in the last 72 hours.   PHYSICAL EXAM General: Well developed, well nourished, in no acute distress HEENT:  Normocephalic and atramatic Neck:  No JVD.  Lungs: Clear bilaterally to auscultation and percussion. Heart: HRRR . Normal S1 and S2 without gallops or murmurs.  Abdomen: Bowel sounds are positive, abdomen soft and non-tender  Msk:  Back normal,  normal gait. Normal strength and tone for age. Extremities: No clubbing, cyanosis or edema.   Neuro: Alert and oriented X 3. Psych:  Good affect, responds appropriately  TELEMETRY: NSR 69bpm  ASSESSMENT AND PLAN: CAD: S/P PCI to RCA 2 days ago, patient was kept an extra night for observation of blood sugar. Blood sugar has been running high, creatinine WNL. May discharge from cardiac perspective, follow up on Monday at 10:30am with Dr. Welton Flakes.  Principal Problem:   Unstable angina [facility]) Active Problems:   S/P drug eluting coronary stent placement    Caroleen Hamman, NP-C 11/19/2018 9:52 AM Cell: [phone]

## 2018-11-19 NOTE — Progress Notes (Signed)
Discharge instructions explained to pt/ verbalized an understanding/ ivs and tele removed/ transported off unit via wheelchair/ taxi voucher provided

## 2018-12-13 ENCOUNTER — Emergency Department
Admission: EM | Admit: 2018-12-13 | Discharge: 2018-12-13 | Disposition: A | Discharge status: Home / Self Care | Payer: Medicaid Other | Attending: Emergency Medicine | Admitting: Emergency Medicine

## 2018-12-13 ENCOUNTER — Other Ambulatory Visit: Payer: Self-pay

## 2018-12-13 DIAGNOSIS — I252 Old myocardial infarction: Secondary | ICD-10-CM | POA: Diagnosis not present

## 2018-12-13 DIAGNOSIS — I509 Heart failure, unspecified: Secondary | ICD-10-CM | POA: Insufficient documentation

## 2018-12-13 DIAGNOSIS — Y999 Unspecified external cause status: Secondary | ICD-10-CM | POA: Diagnosis not present

## 2018-12-13 DIAGNOSIS — E079 Disorder of thyroid, unspecified: Secondary | ICD-10-CM | POA: Diagnosis not present

## 2018-12-13 DIAGNOSIS — I2510 Atherosclerotic heart disease of native coronary artery without angina pectoris: Secondary | ICD-10-CM | POA: Insufficient documentation

## 2018-12-13 DIAGNOSIS — Z794 Long term (current) use of insulin: Secondary | ICD-10-CM | POA: Diagnosis not present

## 2018-12-13 DIAGNOSIS — Z7982 Long term (current) use of aspirin: Secondary | ICD-10-CM | POA: Insufficient documentation

## 2018-12-13 DIAGNOSIS — Y939 Activity, unspecified: Secondary | ICD-10-CM | POA: Diagnosis not present

## 2018-12-13 DIAGNOSIS — W57XXXA Bitten or stung by nonvenomous insect and other nonvenomous arthropods, initial encounter: Secondary | ICD-10-CM

## 2018-12-13 DIAGNOSIS — R21 Rash and other nonspecific skin eruption: Secondary | ICD-10-CM | POA: Diagnosis present

## 2018-12-13 DIAGNOSIS — Z79899 Other long term (current) drug therapy: Secondary | ICD-10-CM | POA: Insufficient documentation

## 2018-12-13 DIAGNOSIS — M069 Rheumatoid arthritis, unspecified: Secondary | ICD-10-CM | POA: Insufficient documentation

## 2018-12-13 DIAGNOSIS — E119 Type 2 diabetes mellitus without complications: Secondary | ICD-10-CM | POA: Insufficient documentation

## 2018-12-13 DIAGNOSIS — Y929 Unspecified place or not applicable: Secondary | ICD-10-CM | POA: Diagnosis not present

## 2018-12-13 DIAGNOSIS — S70362A Insect bite (nonvenomous), left thigh, initial encounter: Secondary | ICD-10-CM | POA: Insufficient documentation

## 2018-12-13 DIAGNOSIS — I110 Hypertensive heart disease with heart failure: Secondary | ICD-10-CM | POA: Diagnosis not present

## 2018-12-13 MED ORDER — MUPIROCIN 2 % EX OINT: TOPICAL_OINTMENT | CUTANEOUS | 0 refills | Status: AC

## 2018-12-13 MED ORDER — TRIAMCINOLONE ACETONIDE 0.5 % EX OINT: 1.0000 "application " | TOPICAL_OINTMENT | Freq: Two times a day (BID) | CUTANEOUS | 0 refills | Status: DC

## 2018-12-13 NOTE — ED Provider Notes (Signed)
[facility] Emergency Department Provider Note  ____________________________________________  Time seen: Approximately 8:49 AM  I have reviewed the triage vital signs and the nursing notes.   HISTORY  Chief Complaint Rash   HPI Derrick Barajas is a 63 y.o. male who presents to the emergency department for treatment and evaluation of burning, itching rash on the back of his left thigh. Symptoms started after sitting in the grass yesterday. Patient is concerned about developing an infection due to multiple co-morbidities. No alleviating measures attempted prior to arrival.   Past Medical History:  Diagnosis Date  . CAD (coronary artery disease)   . CHF (congestive heart failure) (HCC)   . Diabetes mellitus without complication (HCC)   . Gout   . Hyperlipidemia   . Hypertension   . MI (myocardial infarction) (HCC)   . RA (rheumatoid arthritis) (HCC)   . Sleep apnea   . 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 Thyroid disease     Patient Active Problem List   Diagnosis Date Noted  . S/P drug eluting coronary stent placement 11/17/2018  . Unstable angina (HCC) 11/13/2018  . 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 Complete tear of right rotator cuff 03/27/2016  . Diabetes mellitus type 2, uncomplicated (HCC) 10/26/2015  . Hypertension 10/26/2015  . Myocardial infarction (HCC) 10/26/2015    Past Surgical History:  Procedure Laterality Date  . CIRCUMCISION    . CORONARY ANGIOPLASTY WITH STENT PLACEMENT    . CORONARY STENT INTERVENTION N/A 11/17/2018   Procedure: CORONARY STENT INTERVENTION;  Surgeon: Alwyn Pea, MD;  Location: [facility] INVASIVE CV LAB;  Service: Cardiovascular;  Laterality: N/A;  . LEFT HEART CATH AND CORONARY ANGIOGRAPHY Left 11/17/2018   Procedure: LEFT HEART CATH AND CORONARY ANGIOGRAPHY;  Surgeon: Laurier Nancy, MD;  Location: [facility] INVASIVE CV LAB;  Service: Cardiovascular;  Laterality: Left;  . TONSILLECTOMY      Prior to Admission  medications   Medication Sig Start Date End Date Taking? Authorizing Provider  ACCU-CHEK AVIVA PLUS test strip TEST TID 02/02/17   [provider]  amLODipine (NORVASC) 10 MG tablet Take 10 mg by mouth daily.     [provider]  aspirin EC 81 MG tablet Take 81 mg by mouth daily.    [provider]  B-D ULTRAFINE III SHORT PEN 31G X 8 MM MISC USE UTD BID 04/10/17   [provider]  docusate sodium (COLACE) 100 MG capsule Take 100 mg at bedtime by mouth.     [provider]  ezetimibe (ZETIA) 10 MG tablet Take 10 mg by mouth 2 (two) times daily.    Adrian Blackwater A, MD  fluticasone [facility]) 50 MCG/ACT nasal spray Place 1 spray into both nostrils daily.  12/05/16   [provider]  furosemide (LASIX) 20 MG tablet Take 20 mg by mouth 2 (two) times daily.     [provider]  Insulin Degludec (TRESIBA FLEXTOUCH) 200 UNIT/ML SOPN Inject 160 Units into the skin daily.    [provider]  INVOKANA 300 MG TABS tablet Take 300 mg by mouth daily.  10/07/15   [provider]  isosorbide mononitrate (IMDUR) 60 MG 24 hr tablet Take 60 mg daily by mouth.    Caroleen Hamman, FNP  levothyroxine (SYNTHROID, LEVOTHROID) 125 MCG tablet Take 125 mcg by mouth daily before breakfast.  09/26/15   [provider]  metoprolol succinate (TOPROL-XL) 100 MG 24 hr tablet Take 100 mg daily by  mouth. Take with or immediately following a meal.    Caroleen Hamman, FNP  mupirocin ointment (BACTROBAN) 2 % Apply to affected area 3 times daily until healed. 12/13/18 12/13/19  Harshita Bernales, Rulon Eisenmenger B, FNP  olopatadine (PATANOL) 0.1 % ophthalmic solution Place 1 drop into both eyes daily.    [provider]  prasugrel (EFFIENT) 10 MG TABS Take 10 mg by mouth daily at 3 pm.     [provider]  ramipril (ALTACE) 10 MG capsule Take 10 mg by mouth daily.  09/14/15   [provider]  ranolazine (RANEXA) 500 MG 12 hr tablet Take 500 mg  by mouth 2 (two) times daily.    [provider]  rosuvastatin (CRESTOR) 40 MG tablet Take 40 mg by mouth every evening.  11/13/17   [provider]  triamcinolone ointment (KENALOG) 0.5 % Apply 1 application topically 2 (two) times daily. 12/13/18   Dulcey Riederer, Rulon Eisenmenger B, FNP  TRULICITY 1.5 MG/0.5ML SOPN Inject 1.5 mg into the skin every Wednesday.  11/30/17   [provider]    Allergies Shellfish-derived products; Sulfa antibiotics; Iodine; Atorvastatin; Iodinated diagnostic agents; Nsaids; and Shrimp [shellfish allergy]  Family History  Problem Relation Age of Onset  . Diabetes Mother   . Diabetes Brother     Social History Social History   Tobacco Use  . Smoking status: Never Smoker  . Smokeless tobacco: Never Used  Substance Use Topics  . Alcohol use: No    Alcohol/week: 0.0 standard drinks  . Drug use: No    Review of Systems  Constitutional: Negative for fever. Respiratory: Negative for cough or shortness of breath.  Musculoskeletal: Negative for myalgias Skin: Positive for burning pruritic rash. Neurological: Negative for numbness or paresthesias. ____________________________________________   PHYSICAL EXAM:  VITAL SIGNS: ED Triage Vitals  Enc Vitals Group     BP 12/13/18 0843 (!) 162/77     Pulse Rate 12/13/18 0843 88     Resp 12/13/18 0843 16     Temp 12/13/18 0843 97.6 F (36.4 C)     Temp Source 12/13/18 0843 Oral     SpO2 12/13/18 0843 96 %     Weight 12/13/18 0843 275 lb (124.7 kg)     Height 12/13/18 0843 5\' 7"  (1.702 m)     Head Circumference --      Peak Flow --      Pain Score 12/13/18 0841 0     Pain Loc --      Pain Edu? --      Excl. in GC? --      Constitutional: Well appearing. Eyes: Conjunctivae are clear without discharge or drainage. Nose: No rhinorrhea noted. Mouth/Throat: Airway is patent.  Neck: No stridor. Unrestricted range of motion observed. Cardiovascular: Capillary refill is <3 seconds.   Respiratory: Respirations are even and unlabored.. Musculoskeletal: Unrestricted range of motion observed. Neurologic: Awake, alert, and oriented x 4.  Skin:  Urticarial lesions with small pustules and vesicles noted on the posterior left thigh.  ____________________________________________   LABS (all labs ordered are listed, but only abnormal results are displayed)  Labs Reviewed - No data to display ____________________________________________  EKG  Not indicated. ____________________________________________  RADIOLOGY  Not indicated. ____________________________________________   PROCEDURES  Procedures ____________________________________________   INITIAL IMPRESSION / ASSESSMENT AND PLAN / ED COURSE  JOSPH NORFLEET is a 63 y.o. male who presents to the ER due to burning, itching rash that started yesterday. Area of rash is consistent with some type  of insect bites that would have happened while sitting in the grass. He will be treated with triamcinolone ointment and mupirocin. He was advised to see PCP if not improving over the next few days. He was advised to return to the ER for symptoms that change or worsen if unable to schedule an appointment.   Medications - No data to display   Pertinent labs & imaging results that were available during my care of the patient were reviewed by me and considered in my medical decision making (see chart for details).  ____________________________________________   FINAL CLINICAL IMPRESSION(S) / ED DIAGNOSES  Final diagnoses:  Insect bites and stings    ED Discharge Orders         Ordered    mupirocin ointment (BACTROBAN) 2 %     12/13/18 0857    triamcinolone ointment (KENALOG) 0.5 %  2 times daily     12/13/18 0857           Note:  This document was prepared using Dragon voice recognition software and may include unintentional dictation errors.   Victorino Dike, FNP 12/13/18 9774    Schuyler Amor,  MD 12/13/18 [phone]

## 2018-12-13 NOTE — Discharge Instructions (Addendum)
If unable to see primary care and you feel your condition is getting worse, return to the ER.  Use the prescribed ointments for no more than 2 weeks.  If itching prevents sleep, you may take Benadryl 25mg  tablet at night.

## 2018-12-13 NOTE — ED Triage Notes (Signed)
Pt c/o itchy rash to the left leg, states he was sitting on the ground yesterday and noticed it after.

## 2019-01-03 ENCOUNTER — Other Ambulatory Visit: Payer: Self-pay | Admitting: *Deleted

## 2019-01-03 DIAGNOSIS — Z20822 Contact with and (suspected) exposure to covid-19: Secondary | ICD-10-CM

## 2019-01-09 LAB — NOVEL CORONAVIRUS, NAA: SARS-CoV-2, NAA: NOT DETECTED

## 2019-01-26 ENCOUNTER — Other Ambulatory Visit: Payer: Self-pay

## 2019-01-26 ENCOUNTER — Encounter: Payer: Medicaid Other | Attending: Cardiovascular Disease | Admitting: *Deleted

## 2019-01-26 DIAGNOSIS — Z955 Presence of coronary angioplasty implant and graft: Secondary | ICD-10-CM | POA: Insufficient documentation

## 2019-01-26 NOTE — Progress Notes (Signed)
Virtual orientation completed with RN. 6MWT with EP and RD eval scheduled for 7/23 at 11

## 2019-01-29 ENCOUNTER — Encounter: Payer: Medicaid Other | Admitting: *Deleted

## 2019-01-29 ENCOUNTER — Other Ambulatory Visit: Payer: Self-pay

## 2019-01-29 VITALS — Ht 69.5 in | Wt 287.8 lb

## 2019-01-29 DIAGNOSIS — Z955 Presence of coronary angioplasty implant and graft: Secondary | ICD-10-CM | POA: Diagnosis not present

## 2019-01-29 NOTE — Patient Instructions (Signed)
Patient Instructions  Patient Details  Name: Derrick Barajas MRN: [id number] Date of Birth: January 28, 1956 Referring Provider:  Laurier Nancy, MD  Below are your personal goals for exercise, nutrition, and risk factors. Our goal is to help you stay on track towards obtaining and maintaining these goals. We will be discussing your progress on these goals with you throughout the program.  Initial Exercise Prescription: Initial Exercise Prescription - 01/29/19 1300      Date of Initial Exercise RX and Referring Provider   Date  01/29/19    Referring Provider  Adrian Blackwater MD      Treadmill   MPH  1.6    Grade  0    Minutes  15    METs  2.23      Recumbant Bike   Level  1    RPM  50    Watts  10    Minutes  15    METs  2.2      NuStep   Level  1    SPM  80    Minutes  15    METs  2      Biostep-RELP   Level  1    SPM  50    Minutes  15    METs  2      Prescription Details   Frequency (times per week)  3    Duration  Progress to 30 minutes of continuous aerobic without signs/symptoms of physical distress      Intensity   THRR 40-80% of Max Heartrate  107-140    Ratings of Perceived Exertion  11-13    Perceived Dyspnea  0-4      Progression   Progression  Continue to progress workloads to maintain intensity without signs/symptoms of physical distress.      Resistance Training   Training Prescription  Yes    Weight  3 lbs    Reps  10-15       Exercise Goals: Frequency: Be able to perform aerobic exercise two to three times per week in program working toward 2-5 days per week of home exercise.  Intensity: Work with a perceived exertion of 11 (fairly light) - 15 (hard) while following your exercise prescription.  We will make changes to your prescription with you as you progress through the program.   Duration: Be able to do 30 to 45 minutes of continuous aerobic exercise in addition to a 5 minute warm-up and a 5 minute cool-down routine.   Nutrition Goals: Your  personal nutrition goals will be established when you do your nutrition analysis with the dietician.  The following are general nutrition guidelines to follow: Cholesterol < 200mg /day Sodium < 1500mg /day Fiber: Men over 50 yrs - 30 grams per day  Personal Goals: Personal Goals and Risk Factors at Admission - 01/29/19 1341      Core Components/Risk Factors/Patient Goals on Admission    Weight Management  Yes;Obesity;Weight Loss    Intervention  Weight Management: Develop a combined nutrition and exercise program designed to reach desired caloric intake, while maintaining appropriate intake of nutrient and fiber, sodium and fats, and appropriate energy expenditure required for the weight goal.;Weight Management: Provide education and appropriate resources to help participant work on and attain dietary goals.;Weight Management/Obesity: Establish reasonable short term and long term weight goals.;Obesity: Provide education and appropriate resources to help participant work on and attain dietary goals.    Admit Weight  287 lb 12.8 oz (130.5 kg)  Goal Weight: Short Term  282 lb (127.9 kg)    Goal Weight: Long Term  270 lb (122.5 kg)    Expected Outcomes  Short Term: Continue to assess and modify interventions until short term weight is achieved;Long Term: Adherence to nutrition and physical activity/exercise program aimed toward attainment of established weight goal;Weight Loss: Understanding of general recommendations for a balanced deficit meal plan, which promotes 1-2 lb weight loss per week and includes a negative energy balance of [phone] kcal/d;Understanding recommendations for meals to include 15-35% energy as protein, 25-35% energy from fat, 35-60% energy from carbohydrates, less than 200mg  of dietary cholesterol, 20-35 gm of total fiber daily;Understanding of distribution of calorie intake throughout the day with the consumption of 4-5 meals/snacks    Diabetes  Yes    Intervention  Provide  education about signs/symptoms and action to take for hypo/hyperglycemia.;Provide education about proper nutrition, including hydration, and aerobic/resistive exercise prescription along with prescribed medications to achieve blood glucose in normal ranges: Fasting glucose 65-99 mg/dL    Expected Outcomes  Short Term: Participant verbalizes understanding of the signs/symptoms and immediate care of hyper/hypoglycemia, proper foot care and importance of medication, aerobic/resistive exercise and nutrition plan for blood glucose control.;Long Term: Attainment of HbA1C < 7%.    Heart Failure  Yes    Intervention  Provide a combined exercise and nutrition program that is supplemented with education, support and counseling about heart failure. Directed toward relieving symptoms such as shortness of breath, decreased exercise tolerance, and extremity edema.    Expected Outcomes  Improve functional capacity of life;Short term: Attendance in program 2-3 days a week with increased exercise capacity. Reported lower sodium intake. Reported increased fruit and vegetable intake. Reports medication compliance.;Short term: Daily weights obtained and reported for increase. Utilizing diuretic protocols set by physician.;Long term: Adoption of self-care skills and reduction of barriers for early signs and symptoms recognition and intervention leading to self-care maintenance.    Hypertension  Yes    Intervention  Provide education on lifestyle modifcations including regular physical activity/exercise, weight management, moderate sodium restriction and increased consumption of fresh fruit, vegetables, and low fat dairy, alcohol moderation, and smoking cessation.;Monitor prescription use compliance.    Expected Outcomes  Short Term: Continued assessment and intervention until BP is < 140/33mm HG in hypertensive participants. < 130/11mm HG in hypertensive participants with diabetes, heart failure or chronic kidney disease.;Long  Term: Maintenance of blood pressure at goal levels.    Lipids  Yes    Intervention  Provide education and support for participant on nutrition & aerobic/resistive exercise along with prescribed medications to achieve LDL 70mg , HDL >40mg .    Expected Outcomes  Short Term: Participant states understanding of desired cholesterol values and is compliant with medications prescribed. Participant is following exercise prescription and nutrition guidelines.;Long Term: Cholesterol controlled with medications as prescribed, with individualized exercise RX and with personalized nutrition plan. Value goals: LDL < 70mg , HDL > 40 mg.       Tobacco Use Initial Evaluation: Social History   Tobacco Use  Smoking Status Never Smoker  Smokeless Tobacco Never Used    Exercise Goals and Review: Exercise Goals    Row Name 01/29/19 1335             Exercise Goals   Increase Physical Activity  Yes       Intervention  Provide advice, education, support and counseling about physical activity/exercise needs.;Develop an individualized exercise prescription for aerobic and resistive training based on initial evaluation findings,  risk stratification, comorbidities and participant's personal goals.       Expected Outcomes  Short Term: Attend rehab on a regular basis to increase amount of physical activity.;Long Term: Add in home exercise to make exercise part of routine and to increase amount of physical activity.;Long Term: Exercising regularly at least 3-5 days a week.       Increase Strength and Stamina  Yes       Intervention  Provide advice, education, support and counseling about physical activity/exercise needs.;Develop an individualized exercise prescription for aerobic and resistive training based on initial evaluation findings, risk stratification, comorbidities and participant's personal goals.       Expected Outcomes  Short Term: Increase workloads from initial exercise prescription for resistance, speed,  and METs.;Short Term: Perform resistance training exercises routinely during rehab and add in resistance training at home;Long Term: Improve cardiorespiratory fitness, muscular endurance and strength as measured by increased METs and functional capacity ( )       Able to understand and use rate of perceived exertion (RPE) scale  Yes       Intervention  Provide education and explanation on how to use RPE scale       Expected Outcomes  Short Term: Able to use RPE daily in rehab to express subjective intensity level;Long Term:  Able to use RPE to guide intensity level when exercising independently       Able to understand and use Dyspnea scale  Yes       Intervention  Provide education and explanation on how to use Dyspnea scale       Expected Outcomes  Short Term: Able to use Dyspnea scale daily in rehab to express subjective sense of shortness of breath during exertion;Long Term: Able to use Dyspnea scale to guide intensity level when exercising independently       Knowledge and understanding of Target Heart Rate Range (THRR)  Yes       Intervention  Provide education and explanation of THRR including how the numbers were predicted and where they are located for reference       Expected Outcomes  Short Term: Able to state/look up THRR;Short Term: Able to use daily as guideline for intensity in rehab;Long Term: Able to use THRR to govern intensity when exercising independently       Able to check pulse independently  Yes       Intervention  Provide education and demonstration on how to check pulse in carotid and radial arteries.;Review the importance of being able to check your own pulse for safety during independent exercise       Expected Outcomes  Short Term: Able to explain why pulse checking is important during independent exercise;Long Term: Able to check pulse independently and accurately       Understanding of Exercise Prescription  Yes       Intervention  Provide education, explanation, and  written materials on patient's individual exercise prescription       Expected Outcomes  Short Term: Able to explain program exercise prescription;Long Term: Able to explain home exercise prescription to exercise independently          Copy of goals given to participant.

## 2019-01-29 NOTE — Progress Notes (Signed)
Cardiac Individual Treatment Plan  Patient Details  Name: Derrick Barajas MRN: [id number] Date of Birth: 1955-07-17 Referring Provider:     Cardiac Rehab from 01/29/2019 in [facility] Cardiac and Pulmonary Rehab  Referring Provider  Neoma Laming MD      Initial Encounter Date:    Cardiac Rehab from 01/29/2019 in [facility] Cardiac and Pulmonary Rehab  Date  01/29/19      Visit Diagnosis: Status post coronary artery stent placement  Patient's Home Medications on Admission:  Current Outpatient Medications:  .  ACCU-CHEK AVIVA PLUS test strip, TEST TID, Disp: , Rfl: 11 .  amLODipine (NORVASC) 10 MG tablet, Take 10 mg by mouth daily. , Disp: , Rfl:  .  aspirin EC 81 MG tablet, Take 81 mg by mouth daily., Disp: , Rfl:  .  B-D ULTRAFINE III SHORT PEN 31G X 8 MM MISC, USE UTD BID, Disp: , Rfl: 3 .  docusate sodium (COLACE) 100 MG capsule, Take 100 mg at bedtime by mouth. , Disp: , Rfl:  .  ezetimibe (ZETIA) 10 MG tablet, Take 10 mg by mouth 2 (two) times daily., Disp: , Rfl:  .  fluticasone (FLONASE) 50 MCG/ACT nasal spray, Place 1 spray into both nostrils daily. , Disp: , Rfl: 1 .  furosemide (LASIX) 20 MG tablet, Take 20 mg by mouth 2 (two) times daily. , Disp: , Rfl:  .  Insulin Degludec (TRESIBA FLEXTOUCH) 200 UNIT/ML SOPN, Inject 140 Units into the skin daily. , Disp: , Rfl:  .  INVOKANA 300 MG TABS tablet, Take 300 mg by mouth daily. , Disp: , Rfl: 0 .  isosorbide mononitrate (IMDUR) 60 MG 24 hr tablet, Take 60 mg daily by mouth., Disp: , Rfl:  .  levothyroxine (SYNTHROID, LEVOTHROID) 125 MCG tablet, Take 125 mcg by mouth daily before breakfast. , Disp: , Rfl: 2 .  metFORMIN (GLUCOPHAGE-XR) 500 MG 24 hr tablet, , Disp: , Rfl:  .  metoprolol succinate (TOPROL-XL) 100 MG 24 hr tablet, Take 100 mg daily by mouth. Take with or immediately following a meal., Disp: , Rfl:  .  mupirocin ointment (BACTROBAN) 2 %, Apply to affected area 3 times daily until healed., Disp: 22 g, Rfl: 0 .  olopatadine  (PATANOL) 0.1 % ophthalmic solution, Place 1 drop into both eyes daily., Disp: , Rfl:  .  prasugrel (EFFIENT) 10 MG TABS, Take 10 mg by mouth daily at 3 pm. , Disp: , Rfl:  .  ramipril (ALTACE) 10 MG capsule, Take 10 mg by mouth daily. , Disp: , Rfl: 2 .  ranolazine (RANEXA) 500 MG 12 hr tablet, Take 500 mg by mouth 2 (two) times daily., Disp: , Rfl:  .  rosuvastatin (CRESTOR) 40 MG tablet, Take 40 mg by mouth every evening. , Disp: , Rfl: 3 .  triamcinolone ointment (KENALOG) 0.5 %, Apply 1 application topically 2 (two) times daily., Disp: 30 g, Rfl: 0 .  TRULICITY 1.5 VH/8.4ON SOPN, Inject 1.5 mg into the skin every Wednesday. , Disp: , Rfl: 2  Past Medical History: Past Medical History:  Diagnosis Date  . CAD (coronary artery disease)   . CHF (congestive heart failure) ([place])   . Diabetes mellitus without complication ([place])   . Gout   . Hyperlipidemia   . Hypertension   . MI (myocardial infarction) ([place])   . RA (rheumatoid arthritis) ([place])   . Sleep apnea   . 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 Thyroid disease  Tobacco Use: Social History   Tobacco Use  Smoking Status Never Smoker  Smokeless Tobacco Never Used    Labs: Recent Review Flowsheet Data    Labs for ITP Cardiac and Pulmonary Rehab Latest Ref Rng & Units 08/29/2011 02/05/2017 11/18/2018   Cholestrol 0 - 200 mg/dL 139 207(H) 164   LDLCALC 0 - 99 mg/dL 64 99 82   HDL >40 mg/dL 38(L) 35(L) 63   Trlycerides <150 mg/dL 185 367(H) 97   Hemoglobin A1c 4.8 - 5.6 % 10.0(H) 7.5(H) -       Exercise Target Goals: Exercise Program Goal: Individual exercise prescription set using results from initial 6 min walk test and THRR while considering  patient's activity barriers and safety.   Exercise Prescription Goal: Initial exercise prescription builds to 30-45 minutes a day of aerobic activity, 2-3 days per week.  Home exercise guidelines will be given to patient during program as part of exercise prescription that the  participant will acknowledge.  Activity Barriers & Risk Stratification: Activity Barriers & Cardiac Risk Stratification - 01/29/19 1330      Activity Barriers & Cardiac Risk Stratification   Activity Barriers  Joint Problems;Shortness of Breath;Muscular Weakness;Deconditioning;Balance Concerns;Other (comment)    Comments  R rotator cuff tear, occasional knee pain    Cardiac Risk Stratification  High       6 Minute Walk: 6 Minute Walk    Row Name 01/29/19 1329         6 Minute Walk   Phase  Initial     Distance  1100 feet     Walk Time  6 minutes     # of Rest Breaks  0     MPH  2.08     METS  2.26     RPE  14     Perceived Dyspnea   2     VO2 Peak  7.92     Symptoms  Yes (comment)     Comments  SOB     Resting HR  74 bpm     Resting BP  134/74     Resting Oxygen Saturation   98 %     Exercise Oxygen Saturation  during 6 min walk  94 %     Max Ex. HR  107 bpm     Max Ex. BP  146/64     2 Minute Post BP  126/64        Oxygen Initial Assessment:   Oxygen Re-Evaluation:   Oxygen Discharge (Final Oxygen Re-Evaluation):   Initial Exercise Prescription: Initial Exercise Prescription - 01/29/19 1300      Date of Initial Exercise RX and Referring Provider   Date  01/29/19    Referring Provider  Neoma Laming MD      Treadmill   MPH  1.6    Grade  0    Minutes  15    METs  2.23      Recumbant Bike   Level  1    RPM  50    Watts  10    Minutes  15    METs  2.2      NuStep   Level  1    SPM  80    Minutes  15    METs  2      Biostep-RELP   Level  1    SPM  50    Minutes  15    METs  2  Prescription Details   Frequency (times per week)  3    Duration  Progress to 30 minutes of continuous aerobic without signs/symptoms of physical distress      Intensity   THRR 40-80% of Max Heartrate  107-140    Ratings of Perceived Exertion  11-13    Perceived Dyspnea  0-4      Progression   Progression  Continue to progress workloads to maintain  intensity without signs/symptoms of physical distress.      Resistance Training   Training Prescription  Yes    Weight  3 lbs    Reps  10-15       Perform Capillary Blood Glucose checks as needed.  Exercise Prescription Changes: Exercise Prescription Changes    Row Name 01/29/19 1300             Response to Exercise   Blood Pressure (Admit)  134/74       Blood Pressure (Exercise)  146/64       Blood Pressure (Exit)  126/64       Heart Rate (Admit)  74 bpm       Heart Rate (Exercise)  107 bpm       Heart Rate (Exit)  75 bpm       Oxygen Saturation (Admit)  98 %       Oxygen Saturation (Exercise)  94 %       Rating of Perceived Exertion (Exercise)  14       Perceived Dyspnea (Exercise)  2       Symptoms  SOB       Comments  walk test results          Exercise Comments:   Exercise Goals and Review: Exercise Goals    Row Name 01/29/19 1335             Exercise Goals   Increase Physical Activity  Yes       Intervention  Provide advice, education, support and counseling about physical activity/exercise needs.;Develop an individualized exercise prescription for aerobic and resistive training based on initial evaluation findings, risk stratification, comorbidities and participant's personal goals.       Expected Outcomes  Short Term: Attend rehab on a regular basis to increase amount of physical activity.;Long Term: Add in home exercise to make exercise part of routine and to increase amount of physical activity.;Long Term: Exercising regularly at least 3-5 days a week.       Increase Strength and Stamina  Yes       Intervention  Provide advice, education, support and counseling about physical activity/exercise needs.;Develop an individualized exercise prescription for aerobic and resistive training based on initial evaluation findings, risk stratification, comorbidities and participant's personal goals.       Expected Outcomes  Short Term: Increase workloads from initial  exercise prescription for resistance, speed, and METs.;Short Term: Perform resistance training exercises routinely during rehab and add in resistance training at home;Long Term: Improve cardiorespiratory fitness, muscular endurance and strength as measured by increased METs and functional capacity (6MWT)       Able to understand and use rate of perceived exertion (RPE) scale  Yes       Intervention  Provide education and explanation on how to use RPE scale       Expected Outcomes  Short Term: Able to use RPE daily in rehab to express subjective intensity level;Long Term:  Able to use RPE to guide intensity level when exercising independently  Able to understand and use Dyspnea scale  Yes       Intervention  Provide education and explanation on how to use Dyspnea scale       Expected Outcomes  Short Term: Able to use Dyspnea scale daily in rehab to express subjective sense of shortness of breath during exertion;Long Term: Able to use Dyspnea scale to guide intensity level when exercising independently       Knowledge and understanding of Target Heart Rate Range (THRR)  Yes       Intervention  Provide education and explanation of THRR including how the numbers were predicted and where they are located for reference       Expected Outcomes  Short Term: Able to state/look up THRR;Short Term: Able to use daily as guideline for intensity in rehab;Long Term: Able to use THRR to govern intensity when exercising independently       Able to check pulse independently  Yes       Intervention  Provide education and demonstration on how to check pulse in carotid and radial arteries.;Review the importance of being able to check your own pulse for safety during independent exercise       Expected Outcomes  Short Term: Able to explain why pulse checking is important during independent exercise;Long Term: Able to check pulse independently and accurately       Understanding of Exercise Prescription  Yes        Intervention  Provide education, explanation, and written materials on patient's individual exercise prescription       Expected Outcomes  Short Term: Able to explain program exercise prescription;Long Term: Able to explain home exercise prescription to exercise independently          Exercise Goals Re-Evaluation :   Discharge Exercise Prescription (Final Exercise Prescription Changes): Exercise Prescription Changes - 01/29/19 1300      Response to Exercise   Blood Pressure (Admit)  134/74    Blood Pressure (Exercise)  146/64    Blood Pressure (Exit)  126/64    Heart Rate (Admit)  74 bpm    Heart Rate (Exercise)  107 bpm    Heart Rate (Exit)  75 bpm    Oxygen Saturation (Admit)  98 %    Oxygen Saturation (Exercise)  94 %    Rating of Perceived Exertion (Exercise)  14    Perceived Dyspnea (Exercise)  2    Symptoms  SOB    Comments  walk test results       Nutrition:  Target Goals: Understanding of nutrition guidelines, daily intake of sodium <1570m, cholesterol <2041m calories 30% from fat and 7% or less from saturated fats, daily to have 5 or more servings of fruits and vegetables.  Biometrics: Pre Biometrics - 01/29/19 1337      Pre Biometrics   Height  5' 9.5" (1.765 m)    Weight  287 lb 12.8 oz (130.5 kg)    BMI (Calculated)  41.91    Single Leg Stand  2 seconds        Nutrition Therapy Plan and Nutrition Goals: Nutrition Therapy & Goals - 01/29/19 1223      Nutrition Therapy   Diet  Low Na, HH, diabetic diet    Protein (specify units)  100g    Fiber  25 grams    Whole Grain Foods  3 servings    Saturated Fats  12 max. grams    Fruits and Vegetables  5 servings/day    Sodium  1.5 grams      Personal Nutrition Goals   Nutrition Goal  ST: continue with HH eating and label reading LT: lose weight (unspecified goal weight)    Comments  Pt reports normally eating [place] bacon with egg whites (sometimes whole eggs) with some cheese or oatmeal in the am. fried  fish or baked chicken witht the skin, grain or squash, and collards, lettuce, or other greens (similar dinner). Pt will eat snowqueen diet ice cream most nights, every friday will have popcorn and snacks. once a month will have lite beer. Meals on wheels will bring him frozen regular and diet meals for the week (replaces L or D). Uses low fat dairy (aside from cheese) and will try to get olive oil or spreads made with olive oil. Disucssed HH DM diet. discussed the importance of BG control, monitoring, frequent meals, composition of meals (pt reports choosing whoel grains), discussed what to do if BG gets too low. Pt reports wanting to lose weight for his heart, discussed healthy weight loss and how healthy habits and lifestyle is more important. Pt did not want to make any changes. (discussed maybe adding protein extenders (beans) and additional veggies to frozen meals to bulk it up - can also help manage sodium).      Intervention Plan   Intervention  Prescribe, educate and counsel regarding individualized specific dietary modifications aiming towards targeted core components such as weight, hypertension, lipid management, diabetes, heart failure and other comorbidities.;Nutrition handout(s) given to patient.    Expected Outcomes  Short Term Goal: Understand basic principles of dietary content, such as calories, fat, sodium, cholesterol and nutrients.;Short Term Goal: A plan has been developed with personal nutrition goals set during dietitian appointment.;Long Term Goal: Adherence to prescribed nutrition plan.       Nutrition Assessments: Nutrition Assessments - 01/29/19 1243      MEDFICTS Scores   Pre Score  51       Nutrition Goals Re-Evaluation:   Nutrition Goals Discharge (Final Nutrition Goals Re-Evaluation):   Psychosocial: Target Goals: Acknowledge presence or absence of significant depression and/or stress, maximize coping skills, provide positive support system. Participant is able  to verbalize types and ability to use techniques and skills needed for reducing stress and depression.   Initial Review & Psychosocial Screening: Initial Psych Review & Screening - 01/26/19 1422      Initial Review   Current issues with  Current Stress Concerns    Comments  Ansh reported sleeping well, having a good support system, but very stressed about COVID. He is worried about himself and his family/friends. He is getting his meals and is financially okay for now.      Family Dynamics   Good Support System?  Yes      Barriers   Psychosocial barriers to participate in program  There are no identifiable barriers or psychosocial needs.      Screening Interventions   Interventions  Encouraged to exercise    Expected Outcomes  Short Term goal: Utilizing psychosocial counselor, staff and physician to assist with identification of specific Stressors or current issues interfering with healing process. Setting desired goal for each stressor or current issue identified.;Long Term Goal: Stressors or current issues are controlled or eliminated.;Short Term goal: Identification and review with participant of any Quality of Life or Depression concerns found by scoring the questionnaire.;Long Term goal: The participant improves quality of Life and PHQ9 Scores as seen by post scores and/or verbalization of changes  Quality of Life Scores:  Quality of Life - 01/29/19 1338      Quality of Life   Select  Quality of Life      Quality of Life Scores   Health/Function Pre  27.2 %    Socioeconomic Pre  28.29 %    Psych/Spiritual Pre  28.29 %    Family Pre  30 %    GLOBAL Pre  27.94 %      Scores of 19 and below usually indicate a poorer quality of life in these areas.  A difference of  2-3 points is a clinically meaningful difference.  A difference of 2-3 points in the total score of the Quality of Life Index has been associated with significant improvement in overall quality of life,  self-image, physical symptoms, and general health in studies assessing change in quality of life.  PHQ-9: Recent Review Flowsheet Data    Depression screen [facility] 2/9 10/05/2015   Decreased Interest 1   Down, Depressed, Hopeless 0   PHQ - 2 Score 1     Interpretation of Total Score  Total Score Depression Severity:  1-4 = Minimal depression, 5-9 = Mild depression, 10-14 = Moderate depression, 15-19 = Moderately severe depression, 20-27 = Severe depression   Psychosocial Evaluation and Intervention:   Psychosocial Re-Evaluation:   Psychosocial Discharge (Final Psychosocial Re-Evaluation):   Vocational Rehabilitation: Provide vocational rehab assistance to qualifying candidates.   Vocational Rehab Evaluation & Intervention: Vocational Rehab - 01/26/19 1422      Initial Vocational Rehab Evaluation & Intervention   Assessment shows need for Vocational Rehabilitation  No       Education: Education Goals: Education classes will be provided on a variety of topics geared toward better understanding of heart health and risk factor modification. Participant will state understanding/return demonstration of topics presented as noted by education test scores.  Learning Barriers/Preferences: Learning Barriers/Preferences - 01/26/19 1422      Learning Barriers/Preferences   Learning Barriers  None    Learning Preferences  Individual Instruction       Education Topics:  AED/CPR: - Group verbal and written instruction with the use of models to demonstrate the basic use of the AED with the basic ABC's of resuscitation.   General Nutrition Guidelines/Fats and Fiber: -Group instruction provided by verbal, written material, models and posters to present the general guidelines for heart healthy nutrition. Gives an explanation and review of dietary fats and fiber.   Controlling Sodium/Reading Food Labels: -Group verbal and written material supporting the discussion of sodium use in  heart healthy nutrition. Review and explanation with models, verbal and written materials for utilization of the food label.   Exercise Physiology & General Exercise Guidelines: - Group verbal and written instruction with models to review the exercise physiology of the cardiovascular system and associated critical values. Provides general exercise guidelines with specific guidelines to those with heart or lung disease.    Aerobic Exercise & Resistance Training: - Gives group verbal and written instruction on the various components of exercise. Focuses on aerobic and resistive training programs and the benefits of this training and how to safely progress through these programs..   Flexibility, Balance, Mind/Body Relaxation: Provides group verbal/written instruction on the benefits of flexibility and balance training, including mind/body exercise modes such as yoga, pilates and tai chi.  Demonstration and skill practice provided.   Stress and Anxiety: - Provides group verbal and written instruction about the health risks of elevated stress and causes of high  stress.  Discuss the correlation between heart/lung disease and anxiety and treatment options. Review healthy ways to manage with stress and anxiety.   Depression: - Provides group verbal and written instruction on the correlation between heart/lung disease and depressed mood, treatment options, and the stigmas associated with seeking treatment.   Anatomy & Physiology of the Heart: - Group verbal and written instruction and models provide basic cardiac anatomy and physiology, with the coronary electrical and arterial systems. Review of Valvular disease and Heart Failure   Cardiac Procedures: - Group verbal and written instruction to review commonly prescribed medications for heart disease. Reviews the medication, class of the drug, and side effects. Includes the steps to properly store meds and maintain the prescription regimen. (beta  blockers and nitrates)   Cardiac Medications I: - Group verbal and written instruction to review commonly prescribed medications for heart disease. Reviews the medication, class of the drug, and side effects. Includes the steps to properly store meds and maintain the prescription regimen.   Cardiac Medications II: -Group verbal and written instruction to review commonly prescribed medications for heart disease. Reviews the medication, class of the drug, and side effects. (all other drug classes)    Go Sex-Intimacy & Heart Disease, Get SMART - Goal Setting: - Group verbal and written instruction through game format to discuss heart disease and the return to sexual intimacy. Provides group verbal and written material to discuss and apply goal setting through the application of the S.M.A.R.T. Method.   Other Matters of the Heart: - Provides group verbal, written materials and models to describe Stable Angina and Peripheral Artery. Includes description of the disease process and treatment options available to the cardiac patient.   Exercise & Equipment Safety: - Individual verbal instruction and demonstration of equipment use and safety with use of the equipment.   Cardiac Rehab from 01/29/2019 in [facility] Cardiac and Pulmonary Rehab  Date  01/29/19  Educator  [facility]  Instruction Review Code  1- Verbalizes Understanding      Infection Prevention: - Provides verbal and written material to individual with discussion of infection control including proper hand washing and proper equipment cleaning during exercise session.   Cardiac Rehab from 01/29/2019 in [facility] Cardiac and Pulmonary Rehab  Date  01/29/19  Educator  [facility]  Instruction Review Code  1- Verbalizes Understanding      Falls Prevention: - Provides verbal and written material to individual with discussion of falls prevention and safety.   Cardiac Rehab from 01/29/2019 in [facility] Cardiac and Pulmonary Rehab  Date  01/29/19  Educator  [facility]   Instruction Review Code  1- Verbalizes Understanding      Diabetes: - Individual verbal and written instruction to review signs/symptoms of diabetes, desired ranges of glucose level fasting, after meals and with exercise. Acknowledge that pre and post exercise glucose checks will be done for 3 sessions at entry of program.   Cardiac Rehab from 01/29/2019 in [facility] Cardiac and Pulmonary Rehab  Date  01/26/19  Educator  [facility]  Instruction Review Code  1- Verbalizes Understanding      Know Your Numbers and Risk Factors: -Group verbal and written instruction about important numbers in your health.  Discussion of what are risk factors and how they play a role in the disease process.  Review of Cholesterol, Blood Pressure, Diabetes, and BMI and the role they play in your overall health.   Sleep Hygiene: -Provides group verbal and written instruction about how sleep can affect your health.  Define sleep hygiene, discuss sleep cycles and impact of sleep habits. Review good sleep hygiene tips.    Other: -Provides group and verbal instruction on various topics (see comments)   Knowledge Questionnaire Score: Knowledge Questionnaire Score - 01/29/19 1338      Knowledge Questionnaire Score   Pre Score  18/23   Education Focus: MI, A&P, Diabetes, PAD, Nutrition, Exercise, Cessation      Core Components/Risk Factors/Patient Goals at Admission: Personal Goals and Risk Factors at Admission - 01/29/19 1341      Core Components/Risk Factors/Patient Goals on Admission    Weight Management  Yes;Obesity;Weight Loss    Intervention  Weight Management: Develop a combined nutrition and exercise program designed to reach desired caloric intake, while maintaining appropriate intake of nutrient and fiber, sodium and fats, and appropriate energy expenditure required for the weight goal.;Weight Management: Provide education and appropriate resources to help participant work on and attain dietary goals.;Weight  Management/Obesity: Establish reasonable short term and long term weight goals.;Obesity: Provide education and appropriate resources to help participant work on and attain dietary goals.    Admit Weight  287 lb 12.8 oz (130.5 kg)    Goal Weight: Short Term  282 lb (127.9 kg)    Goal Weight: Long Term  270 lb (122.5 kg)    Expected Outcomes  Short Term: Continue to assess and modify interventions until short term weight is achieved;Long Term: Adherence to nutrition and physical activity/exercise program aimed toward attainment of established weight goal;Weight Loss: Understanding of general recommendations for a balanced deficit meal plan, which promotes 1-2 lb weight loss per week and includes a negative energy balance of [phone] kcal/d;Understanding recommendations for meals to include 15-35% energy as protein, 25-35% energy from fat, 35-60% energy from carbohydrates, less than 243m of dietary cholesterol, 20-35 gm of total fiber daily;Understanding of distribution of calorie intake throughout the day with the consumption of 4-5 meals/snacks    Diabetes  Yes    Intervention  Provide education about signs/symptoms and action to take for hypo/hyperglycemia.;Provide education about proper nutrition, including hydration, and aerobic/resistive exercise prescription along with prescribed medications to achieve blood glucose in normal ranges: Fasting glucose 65-99 mg/dL    Expected Outcomes  Short Term: Participant verbalizes understanding of the signs/symptoms and immediate care of hyper/hypoglycemia, proper foot care and importance of medication, aerobic/resistive exercise and nutrition plan for blood glucose control.;Long Term: Attainment of HbA1C < 7%.    Heart Failure  Yes    Intervention  Provide a combined exercise and nutrition program that is supplemented with education, support and counseling about heart failure. Directed toward relieving symptoms such as shortness of breath, decreased exercise  tolerance, and extremity edema.    Expected Outcomes  Improve functional capacity of life;Short term: Attendance in program 2-3 days a week with increased exercise capacity. Reported lower sodium intake. Reported increased fruit and vegetable intake. Reports medication compliance.;Short term: Daily weights obtained and reported for increase. Utilizing diuretic protocols set by physician.;Long term: Adoption of self-care skills and reduction of barriers for early signs and symptoms recognition and intervention leading to self-care maintenance.    Hypertension  Yes    Intervention  Provide education on lifestyle modifcations including regular physical activity/exercise, weight management, moderate sodium restriction and increased consumption of fresh fruit, vegetables, and low fat dairy, alcohol moderation, and smoking cessation.;Monitor prescription use compliance.    Expected Outcomes  Short Term: Continued assessment and intervention until BP is < 140/97mHG in hypertensive participants. < 130/8015mG  in hypertensive participants with diabetes, heart failure or chronic kidney disease.;Long Term: Maintenance of blood pressure at goal levels.    Lipids  Yes    Intervention  Provide education and support for participant on nutrition & aerobic/resistive exercise along with prescribed medications to achieve LDL <66m, HDL >437m    Expected Outcomes  Short Term: Participant states understanding of desired cholesterol values and is compliant with medications prescribed. Participant is following exercise prescription and nutrition guidelines.;Long Term: Cholesterol controlled with medications as prescribed, with individualized exercise RX and with personalized nutrition plan. Value goals: LDL < 7054mHDL > 40 mg.       Core Components/Risk Factors/Patient Goals Review:    Core Components/Risk Factors/Patient Goals at Discharge (Final Review):    ITP Comments: ITP Comments    Row Name 01/26/19 1421  01/29/19 1328         ITP Comments  Virtual Orientation completed. [facility] Scheduled for EP/RD orientation 7/23 at 11  6MWT and orientation completed.  Initial ITP created and sent to Dr. BerRamonita Labvering for Medical Director Dr. MarEmily Filbert       Comments: Initial ITP

## 2019-02-02 ENCOUNTER — Encounter: Payer: Medicaid Other | Admitting: *Deleted

## 2019-02-02 ENCOUNTER — Other Ambulatory Visit: Payer: Self-pay

## 2019-02-02 DIAGNOSIS — Z955 Presence of coronary angioplasty implant and graft: Secondary | ICD-10-CM | POA: Diagnosis not present

## 2019-02-02 LAB — GLUCOSE, CAPILLARY
Glucose-Capillary: 213 mg/dL — ABNORMAL HIGH (ref 70–99)
Glucose-Capillary: 140 mg/dL — ABNORMAL HIGH (ref 70–99)

## 2019-02-02 NOTE — Progress Notes (Signed)
Daily Session Note  Patient Details  Name: Derrick Barajas MRN: [id number] Date of Birth: 1956-06-09 Referring Provider:     Cardiac Rehab from 01/29/2019 in [facility] Cardiac and Pulmonary Rehab  Referring Provider  Neoma Laming MD      Encounter Date: 02/02/2019  Check In: Session Check In - 02/02/19 1114      Check-In   Supervising physician immediately available to respond to emergencies  See telemetry face sheet for immediately available ER MD    Location  [facility]-Cardiac & Pulmonary Rehab    Staff Present  Renita Papa, RN Moises Blood, BS, ACSM CEP, Exercise Physiologist;Joseph Tessie Fass RCP,RRT,BSRT    Virtual Visit  No    Medication changes reported      No    Fall or balance concerns reported     No    Warm-up and Cool-down  Performed on first and last piece of equipment    Resistance Training Performed  Yes    VAD Patient?  No    PAD/SET Patient?  No      Pain Assessment   Currently in Pain?  No/denies          Social History   Tobacco Use  Smoking Status Never Smoker  Smokeless Tobacco Never Used    Goals Met:  Independence with exercise equipment Exercise tolerated well No report of cardiac concerns or symptoms Strength training completed today  Goals Unmet:  Not Applicable  Comments: First full day of exercise!  Patient was oriented to gym and equipment including functions, settings, policies, and procedures.  Patient's individual exercise prescription and treatment plan were reviewed.  All starting workloads were established based on the results of the 6 minute walk test done at initial orientation visit.  The plan for exercise progression was also introduced and progression will be customized based on patient's performance and goals.     Dr. Emily Filbert is Medical Director for [place] and [facility].

## 2019-02-04 ENCOUNTER — Encounter: Payer: Medicaid Other | Admitting: *Deleted

## 2019-02-04 ENCOUNTER — Other Ambulatory Visit: Payer: Self-pay

## 2019-02-04 DIAGNOSIS — Z955 Presence of coronary angioplasty implant and graft: Secondary | ICD-10-CM

## 2019-02-04 LAB — GLUCOSE, CAPILLARY
Glucose-Capillary: 163 mg/dL — ABNORMAL HIGH (ref 70–99)
Glucose-Capillary: 111 mg/dL — ABNORMAL HIGH (ref 70–99)

## 2019-02-04 NOTE — Progress Notes (Signed)
Daily Session Note  Patient Details  Name: Derrick Barajas MRN: [id number] Date of Birth: 25-Jul-1955 Referring Provider:     Cardiac Rehab from 01/29/2019 in [facility] Cardiac and Pulmonary Rehab  Referring Provider  Neoma Laming MD      Encounter Date: 02/04/2019  Check In: Session Check In - 02/04/19 1057      Check-In   Supervising physician immediately available to respond to emergencies  See telemetry face sheet for immediately available ER MD    Location  [facility]-Cardiac & Pulmonary Rehab    Staff Present  Renita Papa, RN BSN;Jessica Luan Pulling, MA, RCEP, CCRP, CCET;Joseph Fairland RCP,RRT,BSRT    Virtual Visit  No    Medication changes reported      No    Fall or balance concerns reported     No    Warm-up and Cool-down  Performed on first and last piece of equipment    Resistance Training Performed  Yes    VAD Patient?  No    PAD/SET Patient?  No      Pain Assessment   Currently in Pain?  No/denies          Social History   Tobacco Use  Smoking Status Never Smoker  Smokeless Tobacco Never Used    Goals Met:  Independence with exercise equipment Exercise tolerated well No report of cardiac concerns or symptoms Strength training completed today  Goals Unmet:  Not Applicable  Comments: Pt able to follow exercise prescription today without complaint.  Will continue to monitor for progression.    Dr. Emily Filbert is Medical Director for [place] and [facility].

## 2019-02-06 ENCOUNTER — Encounter: Payer: Self-pay | Admitting: *Deleted

## 2019-02-06 ENCOUNTER — Telehealth: Payer: Self-pay | Admitting: *Deleted

## 2019-02-06 DIAGNOSIS — Z955 Presence of coronary angioplasty implant and graft: Secondary | ICD-10-CM

## 2019-02-06 NOTE — Telephone Encounter (Signed)
Returned [employer].  He slipped in the driveway this morning and hurt his foot.  I encouraged him to seek care if swelling or pain increase or continue.  He hopes to return on Monday.

## 2019-02-09 ENCOUNTER — Other Ambulatory Visit: Payer: Self-pay

## 2019-02-09 ENCOUNTER — Encounter: Payer: Medicaid Other | Attending: Cardiovascular Disease

## 2019-02-09 DIAGNOSIS — Z955 Presence of coronary angioplasty implant and graft: Secondary | ICD-10-CM | POA: Diagnosis present

## 2019-02-09 LAB — GLUCOSE, CAPILLARY
Glucose-Capillary: 289 mg/dL — ABNORMAL HIGH (ref 70–99)
Glucose-Capillary: 234 mg/dL — ABNORMAL HIGH (ref 70–99)

## 2019-02-09 NOTE — Progress Notes (Signed)
Daily Session Note  Patient Details  Name: Derrick Barajas MRN: [id number] Date of Birth: Oct 28, 1955 Referring Provider:     Cardiac Rehab from 01/29/2019 in [facility] Cardiac and Pulmonary Rehab  Referring Provider  Neoma Laming MD      Encounter Date: 02/09/2019  Check In:      Social History   Tobacco Use  Smoking Status Never Smoker  Smokeless Tobacco Never Used    Goals Met:  Independence with exercise equipment Exercise tolerated well No report of cardiac concerns or symptoms Strength training completed today  Goals Unmet:  Not Applicable  Comments: Pt able to follow exercise prescription today without complaint.  Will continue to monitor for progression.    Dr. Emily Filbert is Medical Director for [place] and [facility].

## 2019-02-11 ENCOUNTER — Other Ambulatory Visit: Payer: Self-pay

## 2019-02-11 ENCOUNTER — Encounter: Payer: Medicaid Other | Admitting: *Deleted

## 2019-02-11 DIAGNOSIS — Z955 Presence of coronary angioplasty implant and graft: Secondary | ICD-10-CM

## 2019-02-11 NOTE — Progress Notes (Signed)
Daily Session Note  Patient Details  Name: Derrick Barajas MRN: [id number] Date of Birth: 1956-01-23 Referring Provider:     Cardiac Rehab from 01/29/2019 in [facility] Cardiac and Pulmonary Rehab  Referring Provider  Neoma Laming MD      Encounter Date: 02/11/2019  Check In: Session Check In - 02/11/19 1106      Check-In   Supervising physician immediately available to respond to emergencies  See telemetry face sheet for immediately available ER MD    Location  [facility]-Cardiac & Pulmonary Rehab    Staff Present  Renita Papa, RN Vickki Hearing, BA, ACSM CEP, Exercise Physiologist;Joseph Tessie Fass RCP,RRT,BSRT    Virtual Visit  No    Medication changes reported      No    Fall or balance concerns reported     No    Warm-up and Cool-down  Performed on first and last piece of equipment    Resistance Training Performed  Yes    VAD Patient?  No    PAD/SET Patient?  No      Pain Assessment   Currently in Pain?  No/denies          Social History   Tobacco Use  Smoking Status Never Smoker  Smokeless Tobacco Never Used    Goals Met:  Independence with exercise equipment Exercise tolerated well No report of cardiac concerns or symptoms Strength training completed today  Goals Unmet:  Not Applicable  Comments: Pt able to follow exercise prescription today without complaint.  Will continue to monitor for progression.    Dr. Emily Filbert is Medical Director for [place] and [facility].

## 2019-02-13 ENCOUNTER — Encounter: Payer: Medicaid Other | Admitting: *Deleted

## 2019-02-13 ENCOUNTER — Other Ambulatory Visit: Payer: Self-pay

## 2019-02-13 DIAGNOSIS — Z955 Presence of coronary angioplasty implant and graft: Secondary | ICD-10-CM | POA: Diagnosis not present

## 2019-02-13 NOTE — Progress Notes (Signed)
Daily Session Note  Patient Details  Name: Derrick Barajas MRN: [id number] Date of Birth: September 23, 1955 Referring Provider:     Cardiac Rehab from 01/29/2019 in [facility] Cardiac and Pulmonary Rehab  Referring Provider  Neoma Laming MD      Encounter Date: 02/13/2019  Check In: Session Check In - 02/13/19 1102      Check-In   Supervising physician immediately available to respond to emergencies  See telemetry face sheet for immediately available ER MD    Location  [facility]-Cardiac & Pulmonary Rehab    Staff Present  Renita Papa, RN Vickki Hearing, BA, ACSM CEP, Exercise Physiologist;Joseph Tessie Fass RCP,RRT,BSRT    Virtual Visit  No    Medication changes reported      No    Fall or balance concerns reported     No    Warm-up and Cool-down  Performed on first and last piece of equipment    Resistance Training Performed  Yes    VAD Patient?  No    PAD/SET Patient?  No      Pain Assessment   Currently in Pain?  No/denies          Social History   Tobacco Use  Smoking Status Never Smoker  Smokeless Tobacco Never Used    Goals Met:  Independence with exercise equipment Exercise tolerated well No report of cardiac concerns or symptoms Strength training completed today  Goals Unmet:  Not Applicable  Comments: Pt able to follow exercise prescription today without complaint.  Will continue to monitor for progression.    Dr. Emily Filbert is Medical Director for [place] and [facility].

## 2019-02-18 ENCOUNTER — Encounter: Payer: Self-pay | Admitting: *Deleted

## 2019-02-18 ENCOUNTER — Telehealth: Payer: Self-pay | Admitting: *Deleted

## 2019-02-18 DIAGNOSIS — Z955 Presence of coronary angioplasty implant and graft: Secondary | ICD-10-CM

## 2019-02-18 NOTE — Telephone Encounter (Signed)
Grace called to let us know he will not be in class until he can get his car fixed.  Kaisen says he cannot use [employer] since he has a car.  I advised him to call and see if he can use [employer] since his car is not working.

## 2019-02-18 NOTE — Progress Notes (Signed)
Cardiac Individual Treatment Plan  Patient Details  Name: Derrick Barajas MRN: [id number] Date of Birth: 1955-07-17 Referring Provider:     Cardiac Rehab from 01/29/2019 in [facility] Cardiac and Pulmonary Rehab  Referring Provider  Neoma Laming MD      Initial Encounter Date:    Cardiac Rehab from 01/29/2019 in [facility] Cardiac and Pulmonary Rehab  Date  01/29/19      Visit Diagnosis: Status post coronary artery stent placement  Patient's Home Medications on Admission:  Current Outpatient Medications:  .  ACCU-CHEK AVIVA PLUS test strip, TEST TID, Disp: , Rfl: 11 .  amLODipine (NORVASC) 10 MG tablet, Take 10 mg by mouth daily. , Disp: , Rfl:  .  aspirin EC 81 MG tablet, Take 81 mg by mouth daily., Disp: , Rfl:  .  B-D ULTRAFINE III SHORT PEN 31G X 8 MM MISC, USE UTD BID, Disp: , Rfl: 3 .  docusate sodium (COLACE) 100 MG capsule, Take 100 mg at bedtime by mouth. , Disp: , Rfl:  .  ezetimibe (ZETIA) 10 MG tablet, Take 10 mg by mouth 2 (two) times daily., Disp: , Rfl:  .  fluticasone (FLONASE) 50 MCG/ACT nasal spray, Place 1 spray into both nostrils daily. , Disp: , Rfl: 1 .  furosemide (LASIX) 20 MG tablet, Take 20 mg by mouth 2 (two) times daily. , Disp: , Rfl:  .  Insulin Degludec (TRESIBA FLEXTOUCH) 200 UNIT/ML SOPN, Inject 140 Units into the skin daily. , Disp: , Rfl:  .  INVOKANA 300 MG TABS tablet, Take 300 mg by mouth daily. , Disp: , Rfl: 0 .  isosorbide mononitrate (IMDUR) 60 MG 24 hr tablet, Take 60 mg daily by mouth., Disp: , Rfl:  .  levothyroxine (SYNTHROID, LEVOTHROID) 125 MCG tablet, Take 125 mcg by mouth daily before breakfast. , Disp: , Rfl: 2 .  metFORMIN (GLUCOPHAGE-XR) 500 MG 24 hr tablet, , Disp: , Rfl:  .  metoprolol succinate (TOPROL-XL) 100 MG 24 hr tablet, Take 100 mg daily by mouth. Take with or immediately following a meal., Disp: , Rfl:  .  mupirocin ointment (BACTROBAN) 2 %, Apply to affected area 3 times daily until healed., Disp: 22 g, Rfl: 0 .  olopatadine  (PATANOL) 0.1 % ophthalmic solution, Place 1 drop into both eyes daily., Disp: , Rfl:  .  prasugrel (EFFIENT) 10 MG TABS, Take 10 mg by mouth daily at 3 pm. , Disp: , Rfl:  .  ramipril (ALTACE) 10 MG capsule, Take 10 mg by mouth daily. , Disp: , Rfl: 2 .  ranolazine (RANEXA) 500 MG 12 hr tablet, Take 500 mg by mouth 2 (two) times daily., Disp: , Rfl:  .  rosuvastatin (CRESTOR) 40 MG tablet, Take 40 mg by mouth every evening. , Disp: , Rfl: 3 .  triamcinolone ointment (KENALOG) 0.5 %, Apply 1 application topically 2 (two) times daily., Disp: 30 g, Rfl: 0 .  TRULICITY 1.5 VH/8.4ON SOPN, Inject 1.5 mg into the skin every Wednesday. , Disp: , Rfl: 2  Past Medical History: Past Medical History:  Diagnosis Date  . CAD (coronary artery disease)   . CHF (congestive heart failure) ([place])   . Diabetes mellitus without complication ([place])   . Gout   . Hyperlipidemia   . Hypertension   . MI (myocardial infarction) ([place])   . RA (rheumatoid arthritis) ([place])   . Sleep apnea   . 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 Thyroid disease  Tobacco Use: Social History   Tobacco Use  Smoking Status Never Smoker  Smokeless Tobacco Never Used    Labs: Recent Review Flowsheet Data    Labs for ITP Cardiac and Pulmonary Rehab Latest Ref Rng & Units 08/29/2011 02/05/2017 11/18/2018   Cholestrol 0 - 200 mg/dL 139 207(H) 164   LDLCALC 0 - 99 mg/dL 64 99 82   HDL >40 mg/dL 38(L) 35(L) 63   Trlycerides <150 mg/dL 185 367(H) 97   Hemoglobin A1c 4.8 - 5.6 % 10.0(H) 7.5(H) -       Exercise Target Goals: Exercise Program Goal: Individual exercise prescription set using results from initial 6 min walk test and THRR while considering  patient's activity barriers and safety.   Exercise Prescription Goal: Initial exercise prescription builds to 30-45 minutes a day of aerobic activity, 2-3 days per week.  Home exercise guidelines will be given to patient during program as part of exercise prescription that the  participant will acknowledge.  Activity Barriers & Risk Stratification: Activity Barriers & Cardiac Risk Stratification - 01/29/19 1330      Activity Barriers & Cardiac Risk Stratification   Activity Barriers  Joint Problems;Shortness of Breath;Muscular Weakness;Deconditioning;Balance Concerns;Other (comment)    Comments  R rotator cuff tear, occasional knee pain    Cardiac Risk Stratification  High       6 Minute Walk: 6 Minute Walk    Row Name 01/29/19 1329         6 Minute Walk   Phase  Initial     Distance  1100 feet     Walk Time  6 minutes     # of Rest Breaks  0     MPH  2.08     METS  2.26     RPE  14     Perceived Dyspnea   2     VO2 Peak  7.92     Symptoms  Yes (comment)     Comments  SOB     Resting HR  74 bpm     Resting BP  134/74     Resting Oxygen Saturation   98 %     Exercise Oxygen Saturation  during 6 min walk  94 %     Max Ex. HR  107 bpm     Max Ex. BP  146/64     2 Minute Post BP  126/64        Oxygen Initial Assessment:   Oxygen Re-Evaluation:   Oxygen Discharge (Final Oxygen Re-Evaluation):   Initial Exercise Prescription: Initial Exercise Prescription - 01/29/19 1300      Date of Initial Exercise RX and Referring Provider   Date  01/29/19    Referring Provider  Neoma Laming MD      Treadmill   MPH  1.6    Grade  0    Minutes  15    METs  2.23      Recumbant Bike   Level  1    RPM  50    Watts  10    Minutes  15    METs  2.2      NuStep   Level  1    SPM  80    Minutes  15    METs  2      Biostep-RELP   Level  1    SPM  50    Minutes  15    METs  2  Prescription Details   Frequency (times per week)  3    Duration  Progress to 30 minutes of continuous aerobic without signs/symptoms of physical distress      Intensity   THRR 40-80% of Max Heartrate  107-140    Ratings of Perceived Exertion  11-13    Perceived Dyspnea  0-4      Progression   Progression  Continue to progress workloads to maintain  intensity without signs/symptoms of physical distress.      Resistance Training   Training Prescription  Yes    Weight  3 lbs    Reps  10-15       Perform Capillary Blood Glucose checks as needed.  Exercise Prescription Changes: Exercise Prescription Changes    Row Name 01/29/19 1300 02/09/19 0900           Response to Exercise   Blood Pressure (Admit)  134/74  148/82      Blood Pressure (Exercise)  146/64  136/74      Blood Pressure (Exit)  126/64  150/92      Heart Rate (Admit)  74 bpm  52 bpm      Heart Rate (Exercise)  107 bpm  105 bpm      Heart Rate (Exit)  75 bpm  96 bpm      Oxygen Saturation (Admit)  98 %  -      Oxygen Saturation (Exercise)  94 %  -      Rating of Perceived Exertion (Exercise)  14  13      Perceived Dyspnea (Exercise)  2  -      Symptoms  SOB  none      Comments  walk test results  -      Duration  -  Progress to 30 minutes of  aerobic without signs/symptoms of physical distress      Intensity  -  THRR unchanged        Progression   Progression  -  Continue to progress workloads to maintain intensity without signs/symptoms of physical distress.      Average METs  -  2.41        Resistance Training   Training Prescription  -  Yes      Weight  -  3 lbs      Reps  -  10-15        Interval Training   Interval Training  -  No        Treadmill   MPH  -  1.6      Grade  -  0      Minutes  -  15      METs  -  2.23        Recumbant Bike   Level  -  3      Watts  -  26      Minutes  -  15      METs  -  2.61        NuStep   Level  -  1      Minutes  -  15      METs  -  1.8        Biostep-RELP   Level  -  3      Minutes  -  15      METs  -  3         Exercise Comments:   Exercise Goals and Review: Exercise  Goals    Row Name 01/29/19 1335             Exercise Goals   Increase Physical Activity  Yes       Intervention  Provide advice, education, support and counseling about physical activity/exercise needs.;Develop an  individualized exercise prescription for aerobic and resistive training based on initial evaluation findings, risk stratification, comorbidities and participant's personal goals.       Expected Outcomes  Short Term: Attend rehab on a regular basis to increase amount of physical activity.;Long Term: Add in home exercise to make exercise part of routine and to increase amount of physical activity.;Long Term: Exercising regularly at least 3-5 days a week.       Increase Strength and Stamina  Yes       Intervention  Provide advice, education, support and counseling about physical activity/exercise needs.;Develop an individualized exercise prescription for aerobic and resistive training based on initial evaluation findings, risk stratification, comorbidities and participant's personal goals.       Expected Outcomes  Short Term: Increase workloads from initial exercise prescription for resistance, speed, and METs.;Short Term: Perform resistance training exercises routinely during rehab and add in resistance training at home;Long Term: Improve cardiorespiratory fitness, muscular endurance and strength as measured by increased METs and functional capacity (6MWT)       Able to understand and use rate of perceived exertion (RPE) scale  Yes       Intervention  Provide education and explanation on how to use RPE scale       Expected Outcomes  Short Term: Able to use RPE daily in rehab to express subjective intensity level;Long Term:  Able to use RPE to guide intensity level when exercising independently       Able to understand and use Dyspnea scale  Yes       Intervention  Provide education and explanation on how to use Dyspnea scale       Expected Outcomes  Short Term: Able to use Dyspnea scale daily in rehab to express subjective sense of shortness of breath during exertion;Long Term: Able to use Dyspnea scale to guide intensity level when exercising independently       Knowledge and understanding of Target Heart  Rate Range (THRR)  Yes       Intervention  Provide education and explanation of THRR including how the numbers were predicted and where they are located for reference       Expected Outcomes  Short Term: Able to state/look up THRR;Short Term: Able to use daily as guideline for intensity in rehab;Long Term: Able to use THRR to govern intensity when exercising independently       Able to check pulse independently  Yes       Intervention  Provide education and demonstration on how to check pulse in carotid and radial arteries.;Review the importance of being able to check your own pulse for safety during independent exercise       Expected Outcomes  Short Term: Able to explain why pulse checking is important during independent exercise;Long Term: Able to check pulse independently and accurately       Understanding of Exercise Prescription  Yes       Intervention  Provide education, explanation, and written materials on patient's individual exercise prescription       Expected Outcomes  Short Term: Able to explain program exercise prescription;Long Term: Able to explain home exercise prescription to exercise independently  Exercise Goals Re-Evaluation : Exercise Goals Re-Evaluation    Row Name 02/02/19 1136 02/09/19 0924           Exercise Goal Re-Evaluation   Exercise Goals Review  Increase Physical Activity;Able to understand and use rate of perceived exertion (RPE) scale;Knowledge and understanding of Target Heart Rate Range (THRR);Understanding of Exercise Prescription;Increase Strength and Stamina;Able to check pulse independently  Increase Physical Activity;Increase Strength and Stamina;Understanding of Exercise Prescription      Comments  Reviewed RPE scale, THR and program prescription with pt today.  Pt voiced understanding and was given a copy of goals to take home.  Derrick Barajas has completed two full days of exercise.  He hurt his foot on Friday and was not able to come in.  He is off to  a good start.  We will continue to monitor his progress.      Expected Outcomes  Short: Use RPE daily to regulate intensity. Long: Follow program prescription in THR.  Short: Continue to attend rehab regularly.  Long: Continue to follow program prescription.         Discharge Exercise Prescription (Final Exercise Prescription Changes): Exercise Prescription Changes - 02/09/19 0900      Response to Exercise   Blood Pressure (Admit)  148/82    Blood Pressure (Exercise)  136/74    Blood Pressure (Exit)  150/92    Heart Rate (Admit)  52 bpm    Heart Rate (Exercise)  105 bpm    Heart Rate (Exit)  96 bpm    Rating of Perceived Exertion (Exercise)  13    Symptoms  none    Duration  Progress to 30 minutes of  aerobic without signs/symptoms of physical distress    Intensity  THRR unchanged      Progression   Progression  Continue to progress workloads to maintain intensity without signs/symptoms of physical distress.    Average METs  2.41      Resistance Training   Training Prescription  Yes    Weight  3 lbs    Reps  10-15      Interval Training   Interval Training  No      Treadmill   MPH  1.6    Grade  0    Minutes  15    METs  2.23      Recumbant Bike   Level  3    Watts  26    Minutes  15    METs  2.61      NuStep   Level  1    Minutes  15    METs  1.8      Biostep-RELP   Level  3    Minutes  15    METs  3       Nutrition:  Target Goals: Understanding of nutrition guidelines, daily intake of sodium '1500mg'$ , cholesterol '200mg'$ , calories 30% from fat and 7% or less from saturated fats, daily to have 5 or more servings of fruits and vegetables.  Biometrics: Pre Biometrics - 01/29/19 1337      Pre Biometrics   Height  5' 9.5" (1.765 m)    Weight  287 lb 12.8 oz (130.5 kg)    BMI (Calculated)  41.91    Single Leg Stand  2 seconds        Nutrition Therapy Plan and Nutrition Goals: Nutrition Therapy & Goals - 01/29/19 1223      Nutrition Therapy   Diet   Low Na,  HH, diabetic diet    Protein (specify units)  100g    Fiber  25 grams    Whole Grain Foods  3 servings    Saturated Fats  12 max. grams    Fruits and Vegetables  5 servings/day    Sodium  1.5 grams      Personal Nutrition Goals   Nutrition Goal  ST: continue with HH eating and label reading LT: lose weight (unspecified goal weight)    Comments  Pt reports normally eating [place] bacon with egg whites (sometimes whole eggs) with some cheese or oatmeal in the am. fried fish or baked chicken witht the skin, grain or squash, and collards, lettuce, or other greens (similar dinner). Pt will eat snowqueen diet ice cream most nights, every friday will have popcorn and snacks. once a month will have lite beer. Meals on wheels will bring him frozen regular and diet meals for the week (replaces L or D). Uses low fat dairy (aside from cheese) and will try to get olive oil or spreads made with olive oil. Disucssed HH DM diet. discussed the importance of BG control, monitoring, frequent meals, composition of meals (pt reports choosing whoel grains), discussed what to do if BG gets too low. Pt reports wanting to lose weight for his heart, discussed healthy weight loss and how healthy habits and lifestyle is more important. Pt did not want to make any changes. (discussed maybe adding protein extenders (beans) and additional veggies to frozen meals to bulk it up - can also help manage sodium).      Intervention Plan   Intervention  Prescribe, educate and counsel regarding individualized specific dietary modifications aiming towards targeted core components such as weight, hypertension, lipid management, diabetes, heart failure and other comorbidities.;Nutrition handout(s) given to patient.    Expected Outcomes  Short Term Goal: Understand basic principles of dietary content, such as calories, fat, sodium, cholesterol and nutrients.;Short Term Goal: A plan has been developed with personal nutrition goals set  during dietitian appointment.;Long Term Goal: Adherence to prescribed nutrition plan.       Nutrition Assessments: Nutrition Assessments - 01/29/19 1243      MEDFICTS Scores   Pre Score  51       Nutrition Goals Re-Evaluation:   Nutrition Goals Discharge (Final Nutrition Goals Re-Evaluation):   Psychosocial: Target Goals: Acknowledge presence or absence of significant depression and/or stress, maximize coping skills, provide positive support system. Participant is able to verbalize types and ability to use techniques and skills needed for reducing stress and depression.   Initial Review & Psychosocial Screening: Initial Psych Review & Screening - 01/26/19 1422      Initial Review   Current issues with  Current Stress Concerns    Comments  Derrick Barajas reported sleeping well, having a good support system, but very stressed about COVID. He is worried about himself and his family/friends. He is getting his meals and is financially okay for now.      Family Dynamics   Good Support System?  Yes      Barriers   Psychosocial barriers to participate in program  There are no identifiable barriers or psychosocial needs.      Screening Interventions   Interventions  Encouraged to exercise    Expected Outcomes  Short Term goal: Utilizing psychosocial counselor, staff and physician to assist with identification of specific Stressors or current issues interfering with healing process. Setting desired goal for each stressor or current issue identified.;Long Term Goal: Stressors or  current issues are controlled or eliminated.;Short Term goal: Identification and review with participant of any Quality of Life or Depression concerns found by scoring the questionnaire.;Long Term goal: The participant improves quality of Life and PHQ9 Scores as seen by post scores and/or verbalization of changes       Quality of Life Scores:  Quality of Life - 01/29/19 1338      Quality of Life   Select  Quality of  Life      Quality of Life Scores   Health/Function Pre  27.2 %    Socioeconomic Pre  28.29 %    Psych/Spiritual Pre  28.29 %    Family Pre  30 %    GLOBAL Pre  27.94 %      Scores of 19 and below usually indicate a poorer quality of life in these areas.  A difference of  2-3 points is a clinically meaningful difference.  A difference of 2-3 points in the total score of the Quality of Life Index has been associated with significant improvement in overall quality of life, self-image, physical symptoms, and general health in studies assessing change in quality of life.  PHQ-9: Recent Review Flowsheet Data    Depression screen [facility] 2/9 10/05/2015   Decreased Interest 1   Down, Depressed, Hopeless 0   PHQ - 2 Score 1     Interpretation of Total Score  Total Score Depression Severity:  1-4 = Minimal depression, 5-9 = Mild depression, 10-14 = Moderate depression, 15-19 = Moderately severe depression, 20-27 = Severe depression   Psychosocial Evaluation and Intervention:   Psychosocial Re-Evaluation:   Psychosocial Discharge (Final Psychosocial Re-Evaluation):   Vocational Rehabilitation: Provide vocational rehab assistance to qualifying candidates.   Vocational Rehab Evaluation & Intervention: Vocational Rehab - 01/26/19 1422      Initial Vocational Rehab Evaluation & Intervention   Assessment shows need for Vocational Rehabilitation  No       Education: Education Goals: Education classes will be provided on a variety of topics geared toward better understanding of heart health and risk factor modification. Participant will state understanding/return demonstration of topics presented as noted by education test scores.  Learning Barriers/Preferences: Learning Barriers/Preferences - 01/26/19 1422      Learning Barriers/Preferences   Learning Barriers  None    Learning Preferences  Individual Instruction       Education Topics:  AED/CPR: - Group verbal and written  instruction with the use of models to demonstrate the basic use of the AED with the basic ABC's of resuscitation.   General Nutrition Guidelines/Fats and Fiber: -Group instruction provided by verbal, written material, models and posters to present the general guidelines for heart healthy nutrition. Gives an explanation and review of dietary fats and fiber.   Controlling Sodium/Reading Food Labels: -Group verbal and written material supporting the discussion of sodium use in heart healthy nutrition. Review and explanation with models, verbal and written materials for utilization of the food label.   Exercise Physiology & General Exercise Guidelines: - Group verbal and written instruction with models to review the exercise physiology of the cardiovascular system and associated critical values. Provides general exercise guidelines with specific guidelines to those with heart or lung disease.    Aerobic Exercise & Resistance Training: - Gives group verbal and written instruction on the various components of exercise. Focuses on aerobic and resistive training programs and the benefits of this training and how to safely progress through these programs..   Flexibility, Balance, Mind/Body Relaxation:  Provides group verbal/written instruction on the benefits of flexibility and balance training, including mind/body exercise modes such as yoga, pilates and tai chi.  Demonstration and skill practice provided.   Stress and Anxiety: - Provides group verbal and written instruction about the health risks of elevated stress and causes of high stress.  Discuss the correlation between heart/lung disease and anxiety and treatment options. Review healthy ways to manage with stress and anxiety.   Depression: - Provides group verbal and written instruction on the correlation between heart/lung disease and depressed mood, treatment options, and the stigmas associated with seeking treatment.   Anatomy &  Physiology of the Heart: - Group verbal and written instruction and models provide basic cardiac anatomy and physiology, with the coronary electrical and arterial systems. Review of Valvular disease and Heart Failure   Cardiac Procedures: - Group verbal and written instruction to review commonly prescribed medications for heart disease. Reviews the medication, class of the drug, and side effects. Includes the steps to properly store meds and maintain the prescription regimen. (beta blockers and nitrates)   Cardiac Medications I: - Group verbal and written instruction to review commonly prescribed medications for heart disease. Reviews the medication, class of the drug, and side effects. Includes the steps to properly store meds and maintain the prescription regimen.   Cardiac Medications II: -Group verbal and written instruction to review commonly prescribed medications for heart disease. Reviews the medication, class of the drug, and side effects. (all other drug classes)    Go Sex-Intimacy & Heart Disease, Get SMART - Goal Setting: - Group verbal and written instruction through game format to discuss heart disease and the return to sexual intimacy. Provides group verbal and written material to discuss and apply goal setting through the application of the S.M.A.R.T. Method.   Other Matters of the Heart: - Provides group verbal, written materials and models to describe Stable Angina and Peripheral Artery. Includes description of the disease process and treatment options available to the cardiac patient.   Exercise & Equipment Safety: - Individual verbal instruction and demonstration of equipment use and safety with use of the equipment.   Cardiac Rehab from 01/29/2019 in [facility] Cardiac and Pulmonary Rehab  Date  01/29/19  Educator  [facility]  Instruction Review Code  1- Verbalizes Understanding      Infection Prevention: - Provides verbal and written material to individual with discussion of  infection control including proper hand washing and proper equipment cleaning during exercise session.   Cardiac Rehab from 01/29/2019 in [facility] Cardiac and Pulmonary Rehab  Date  01/29/19  Educator  [facility]  Instruction Review Code  1- Verbalizes Understanding      Falls Prevention: - Provides verbal and written material to individual with discussion of falls prevention and safety.   Cardiac Rehab from 01/29/2019 in [facility] Cardiac and Pulmonary Rehab  Date  01/29/19  Educator  [facility]  Instruction Review Code  1- Verbalizes Understanding      Diabetes: - Individual verbal and written instruction to review signs/symptoms of diabetes, desired ranges of glucose level fasting, after meals and with exercise. Acknowledge that pre and post exercise glucose checks will be done for 3 sessions at entry of program.   Cardiac Rehab from 01/29/2019 in [facility]  Date  01/26/19  Educator  [facility]  Instruction Review Code  1- Verbalizes Understanding      Know Your Numbers and Risk Factors: -Group verbal and written instruction about important numbers in your health.  Discussion of what are risk factors and how they play a role in the disease process.  Review of Cholesterol, Blood Pressure, Diabetes, and BMI and the role they play in your overall health.   Sleep Hygiene: -Provides group verbal and written instruction about how sleep can affect your health.  Define sleep hygiene, discuss sleep cycles and impact of sleep habits. Review good sleep hygiene tips.    Other: -Provides group and verbal instruction on various topics (see comments)   Knowledge Questionnaire Score: Knowledge Questionnaire Score - 01/29/19 1338      Knowledge Questionnaire Score   Pre Score  18/23   Education Focus: MI, A&P, Diabetes, PAD, Nutrition, Exercise, Cessation      Core Components/Risk Factors/Patient Goals at Admission: Personal Goals and Risk Factors at Admission - 01/29/19 1341      Core  Components/Risk Factors/Patient Goals on Admission    Weight Management  Yes;Obesity;Weight Loss    Intervention  Weight Management: Develop a combined nutrition and exercise program designed to reach desired caloric intake, while maintaining appropriate intake of nutrient and fiber, sodium and fats, and appropriate energy expenditure required for the weight goal.;Weight Management: Provide education and appropriate resources to help participant work on and attain dietary goals.;Weight Management/Obesity: Establish reasonable short term and long term weight goals.;Obesity: Provide education and appropriate resources to help participant work on and attain dietary goals.    Admit Weight  287 lb 12.8 oz (130.5 kg)    Goal Weight: Short Term  282 lb (127.9 kg)    Goal Weight: Long Term  270 lb (122.5 kg)    Expected Outcomes  Short Term: Continue to assess and modify interventions until short term weight is achieved;Long Term: Adherence to nutrition and physical activity/exercise program aimed toward attainment of established weight goal;Weight Loss: Understanding of general recommendations for a balanced deficit meal plan, which promotes 1-2 lb weight loss per week and includes a negative energy balance of [phone] kcal/d;Understanding recommendations for meals to include 15-35% energy as protein, 25-35% energy from fat, 35-60% energy from carbohydrates, less than '200mg'$  of dietary cholesterol, 20-35 gm of total fiber daily;Understanding of distribution of calorie intake throughout the day with the consumption of 4-5 meals/snacks    Diabetes  Yes    Intervention  Provide education about signs/symptoms and action to take for hypo/hyperglycemia.;Provide education about proper nutrition, including hydration, and aerobic/resistive exercise prescription along with prescribed medications to achieve blood glucose in normal ranges: Fasting glucose 65-99 mg/dL    Expected Outcomes  Short Term: Participant verbalizes  understanding of the signs/symptoms and immediate care of hyper/hypoglycemia, proper foot care and importance of medication, aerobic/resistive exercise and nutrition plan for blood glucose control.;Long Term: Attainment of HbA1C < 7%.    Heart Failure  Yes    Intervention  Provide a combined exercise and nutrition program that is supplemented with education, support and counseling about heart failure. Directed toward relieving symptoms such as shortness of breath, decreased exercise tolerance, and extremity edema.    Expected Outcomes  Improve functional capacity of life;Short term: Attendance in program 2-3 days a week with increased exercise capacity. Reported lower sodium intake. Reported increased fruit and vegetable intake. Reports medication compliance.;Short term: Daily weights obtained and reported for increase. Utilizing diuretic protocols set by physician.;Long term: Adoption of self-care skills and reduction of barriers for early signs and symptoms recognition and intervention leading to self-care maintenance.    Hypertension  Yes    Intervention  Provide education on lifestyle modifcations  including regular physical activity/exercise, weight management, moderate sodium restriction and increased consumption of fresh fruit, vegetables, and low fat dairy, alcohol moderation, and smoking cessation.;Monitor prescription use compliance.    Expected Outcomes  Short Term: Continued assessment and intervention until BP is < 140/53m HG in hypertensive participants. < 130/863mHG in hypertensive participants with diabetes, heart failure or chronic kidney disease.;Long Term: Maintenance of blood pressure at goal levels.    Lipids  Yes    Intervention  Provide education and support for participant on nutrition & aerobic/resistive exercise along with prescribed medications to achieve LDL '70mg'$ , HDL >'40mg'$ .    Expected Outcomes  Short Term: Participant states understanding of desired cholesterol values and is  compliant with medications prescribed. Participant is following exercise prescription and nutrition guidelines.;Long Term: Cholesterol controlled with medications as prescribed, with individualized exercise RX and with personalized nutrition plan. Value goals: LDL < '70mg'$ , HDL > 40 mg.       Core Components/Risk Factors/Patient Goals Review:    Core Components/Risk Factors/Patient Goals at Discharge (Final Review):    ITP Comments: ITP Comments    Row Name 01/26/19 1421 01/29/19 1328 02/02/19 1136 02/06/19 1122 02/18/19 0551   ITP Comments  Virtual Orientation completed. [facility] Scheduled for EP/RD orientation 7/23 at 11  6MWT and orientation completed.  Initial ITP created and sent to Dr. BeRamonita Labovering for Medical Director Dr. MaEmily Filbert First full day of exercise!  Patient was oriented to gym and equipment including functions, settings, policies, and procedures.  Patient's individual exercise prescription and treatment plan were reviewed.  All starting workloads were established based on the results of the 6 minute walk test done at initial orientation visit.  The plan for exercise progression was also introduced and progression will be customized based on patient's performance and goals.  Returned [employer] He slipped in the driveway this morning and hurt his foot.  I encouraged him to seek care if swelling or pain increase or continue.  He hopes to return on Monday.  30 Day Review Completed today. Continue with ITP unless changed by Medical Director review.      Comments:

## 2019-02-25 ENCOUNTER — Telehealth: Payer: Self-pay

## 2019-02-25 NOTE — Telephone Encounter (Signed)
Pt called to let us know he will not be in class until he can get his car fixed. Saw in note that he had called 8/12 to first inform us, pt does not know currently when he will be back. Per last encounter Derrick Barajas said he cannot use [employer] since he has a car. He was advised to call and see if he can use [employer] since his car is not working.

## 2019-03-11 ENCOUNTER — Encounter: Payer: Medicaid Other | Attending: Cardiovascular Disease

## 2019-03-11 DIAGNOSIS — Z955 Presence of coronary angioplasty implant and graft: Secondary | ICD-10-CM | POA: Insufficient documentation

## 2019-03-17 NOTE — Progress Notes (Signed)
Unable to complete nutrition 30-day re-eval cycle due to inconsistent attendence 

## 2019-03-18 ENCOUNTER — Telehealth: Payer: Self-pay

## 2019-03-18 ENCOUNTER — Encounter: Payer: Self-pay | Admitting: *Deleted

## 2019-03-18 DIAGNOSIS — Z955 Presence of coronary angioplasty implant and graft: Secondary | ICD-10-CM

## 2019-03-18 NOTE — Telephone Encounter (Signed)
He wants to come back and misses class.  He feels his Medicaid counselor may be able to help him find transportation.

## 2019-03-18 NOTE — Progress Notes (Signed)
Cardiac Individual Treatment Plan  Patient Details  Name: Derrick Barajas MRN: [id number] Date of Birth: 1955-07-17 Referring Provider:     Cardiac Rehab from 01/29/2019 in [facility] Cardiac and Pulmonary Rehab  Referring Provider  Neoma Laming MD      Initial Encounter Date:    Cardiac Rehab from 01/29/2019 in [facility] Cardiac and Pulmonary Rehab  Date  01/29/19      Visit Diagnosis: Status post coronary artery stent placement  Patient's Home Medications on Admission:  Current Outpatient Medications:  .  ACCU-CHEK AVIVA PLUS test strip, TEST TID, Disp: , Rfl: 11 .  amLODipine (NORVASC) 10 MG tablet, Take 10 mg by mouth daily. , Disp: , Rfl:  .  aspirin EC 81 MG tablet, Take 81 mg by mouth daily., Disp: , Rfl:  .  B-D ULTRAFINE III SHORT PEN 31G X 8 MM MISC, USE UTD BID, Disp: , Rfl: 3 .  docusate sodium (COLACE) 100 MG capsule, Take 100 mg at bedtime by mouth. , Disp: , Rfl:  .  ezetimibe (ZETIA) 10 MG tablet, Take 10 mg by mouth 2 (two) times daily., Disp: , Rfl:  .  fluticasone (FLONASE) 50 MCG/ACT nasal spray, Place 1 spray into both nostrils daily. , Disp: , Rfl: 1 .  furosemide (LASIX) 20 MG tablet, Take 20 mg by mouth 2 (two) times daily. , Disp: , Rfl:  .  Insulin Degludec (TRESIBA FLEXTOUCH) 200 UNIT/ML SOPN, Inject 140 Units into the skin daily. , Disp: , Rfl:  .  INVOKANA 300 MG TABS tablet, Take 300 mg by mouth daily. , Disp: , Rfl: 0 .  isosorbide mononitrate (IMDUR) 60 MG 24 hr tablet, Take 60 mg daily by mouth., Disp: , Rfl:  .  levothyroxine (SYNTHROID, LEVOTHROID) 125 MCG tablet, Take 125 mcg by mouth daily before breakfast. , Disp: , Rfl: 2 .  metFORMIN (GLUCOPHAGE-XR) 500 MG 24 hr tablet, , Disp: , Rfl:  .  metoprolol succinate (TOPROL-XL) 100 MG 24 hr tablet, Take 100 mg daily by mouth. Take with or immediately following a meal., Disp: , Rfl:  .  mupirocin ointment (BACTROBAN) 2 %, Apply to affected area 3 times daily until healed., Disp: 22 g, Rfl: 0 .  olopatadine  (PATANOL) 0.1 % ophthalmic solution, Place 1 drop into both eyes daily., Disp: , Rfl:  .  prasugrel (EFFIENT) 10 MG TABS, Take 10 mg by mouth daily at 3 pm. , Disp: , Rfl:  .  ramipril (ALTACE) 10 MG capsule, Take 10 mg by mouth daily. , Disp: , Rfl: 2 .  ranolazine (RANEXA) 500 MG 12 hr tablet, Take 500 mg by mouth 2 (two) times daily., Disp: , Rfl:  .  rosuvastatin (CRESTOR) 40 MG tablet, Take 40 mg by mouth every evening. , Disp: , Rfl: 3 .  triamcinolone ointment (KENALOG) 0.5 %, Apply 1 application topically 2 (two) times daily., Disp: 30 g, Rfl: 0 .  TRULICITY 1.5 VH/8.4ON SOPN, Inject 1.5 mg into the skin every Wednesday. , Disp: , Rfl: 2  Past Medical History: Past Medical History:  Diagnosis Date  . CAD (coronary artery disease)   . CHF (congestive heart failure) ([place])   . Diabetes mellitus without complication ([place])   . Gout   . Hyperlipidemia   . Hypertension   . MI (myocardial infarction) ([place])   . RA (rheumatoid arthritis) ([place])   . Sleep apnea   . 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 Thyroid disease  Tobacco Use: Social History   Tobacco Use  Smoking Status Never Smoker  Smokeless Tobacco Never Used    Labs: Recent Review Flowsheet Data    Labs for ITP Cardiac and Pulmonary Rehab Latest Ref Rng & Units 08/29/2011 02/05/2017 11/18/2018   Cholestrol 0 - 200 mg/dL 139 207(H) 164   LDLCALC 0 - 99 mg/dL 64 99 82   HDL >40 mg/dL 38(L) 35(L) 63   Trlycerides <150 mg/dL 185 367(H) 97   Hemoglobin A1c 4.8 - 5.6 % 10.0(H) 7.5(H) -       Exercise Target Goals: Exercise Program Goal: Individual exercise prescription set using results from initial 6 min walk test and THRR while considering  patient's activity barriers and safety.   Exercise Prescription Goal: Initial exercise prescription builds to 30-45 minutes a day of aerobic activity, 2-3 days per week.  Home exercise guidelines will be given to patient during program as part of exercise prescription that the  participant will acknowledge.  Activity Barriers & Risk Stratification: Activity Barriers & Cardiac Risk Stratification - 01/29/19 1330      Activity Barriers & Cardiac Risk Stratification   Activity Barriers  Joint Problems;Shortness of Breath;Muscular Weakness;Deconditioning;Balance Concerns;Other (comment)    Comments  R rotator cuff tear, occasional knee pain    Cardiac Risk Stratification  High       6 Minute Walk: 6 Minute Walk    Row Name 01/29/19 1329         6 Minute Walk   Phase  Initial     Distance  1100 feet     Walk Time  6 minutes     # of Rest Breaks  0     MPH  2.08     METS  2.26     RPE  14     Perceived Dyspnea   2     VO2 Peak  7.92     Symptoms  Yes (comment)     Comments  SOB     Resting HR  74 bpm     Resting BP  134/74     Resting Oxygen Saturation   98 %     Exercise Oxygen Saturation  during 6 min walk  94 %     Max Ex. HR  107 bpm     Max Ex. BP  146/64     2 Minute Post BP  126/64        Oxygen Initial Assessment:   Oxygen Re-Evaluation:   Oxygen Discharge (Final Oxygen Re-Evaluation):   Initial Exercise Prescription: Initial Exercise Prescription - 01/29/19 1300      Date of Initial Exercise RX and Referring Provider   Date  01/29/19    Referring Provider  Neoma Laming MD      Treadmill   MPH  1.6    Grade  0    Minutes  15    METs  2.23      Recumbant Bike   Level  1    RPM  50    Watts  10    Minutes  15    METs  2.2      NuStep   Level  1    SPM  80    Minutes  15    METs  2      Biostep-RELP   Level  1    SPM  50    Minutes  15    METs  2  Prescription Details   Frequency (times per week)  3    Duration  Progress to 30 minutes of continuous aerobic without signs/symptoms of physical distress      Intensity   THRR 40-80% of Max Heartrate  107-140    Ratings of Perceived Exertion  11-13    Perceived Dyspnea  0-4      Progression   Progression  Continue to progress workloads to maintain  intensity without signs/symptoms of physical distress.      Resistance Training   Training Prescription  Yes    Weight  3 lbs    Reps  10-15       Perform Capillary Blood Glucose checks as needed.  Exercise Prescription Changes: Exercise Prescription Changes    Row Name 01/29/19 1300 02/09/19 0900           Response to Exercise   Blood Pressure (Admit)  134/74  148/82      Blood Pressure (Exercise)  146/64  136/74      Blood Pressure (Exit)  126/64  150/92      Heart Rate (Admit)  74 bpm  52 bpm      Heart Rate (Exercise)  107 bpm  105 bpm      Heart Rate (Exit)  75 bpm  96 bpm      Oxygen Saturation (Admit)  98 %  -      Oxygen Saturation (Exercise)  94 %  -      Rating of Perceived Exertion (Exercise)  14  13      Perceived Dyspnea (Exercise)  2  -      Symptoms  SOB  none      Comments  walk test results  -      Duration  -  Progress to 30 minutes of  aerobic without signs/symptoms of physical distress      Intensity  -  THRR unchanged        Progression   Progression  -  Continue to progress workloads to maintain intensity without signs/symptoms of physical distress.      Average METs  -  2.41        Resistance Training   Training Prescription  -  Yes      Weight  -  3 lbs      Reps  -  10-15        Interval Training   Interval Training  -  No        Treadmill   MPH  -  1.6      Grade  -  0      Minutes  -  15      METs  -  2.23        Recumbant Bike   Level  -  3      Watts  -  26      Minutes  -  15      METs  -  2.61        NuStep   Level  -  1      Minutes  -  15      METs  -  1.8        Biostep-RELP   Level  -  3      Minutes  -  15      METs  -  3         Exercise Comments: Exercise Comments    Row Name  02/27/19 0850           Exercise Comments  Derrick Barajas has not attended since 8/7.          Exercise Goals and Review: Exercise Goals    Row Name 01/29/19 1335             Exercise Goals   Increase Physical Activity  Yes        Intervention  Provide advice, education, support and counseling about physical activity/exercise needs.;Develop an individualized exercise prescription for aerobic and resistive training based on initial evaluation findings, risk stratification, comorbidities and participant's personal goals.       Expected Outcomes  Short Term: Attend rehab on a regular basis to increase amount of physical activity.;Long Term: Add in home exercise to make exercise part of routine and to increase amount of physical activity.;Long Term: Exercising regularly at least 3-5 days a week.       Increase Strength and Stamina  Yes       Intervention  Provide advice, education, support and counseling about physical activity/exercise needs.;Develop an individualized exercise prescription for aerobic and resistive training based on initial evaluation findings, risk stratification, comorbidities and participant's personal goals.       Expected Outcomes  Short Term: Increase workloads from initial exercise prescription for resistance, speed, and METs.;Short Term: Perform resistance training exercises routinely during rehab and add in resistance training at home;Long Term: Improve cardiorespiratory fitness, muscular endurance and strength as measured by increased METs and functional capacity (6MWT)       Able to understand and use rate of perceived exertion (RPE) scale  Yes       Intervention  Provide education and explanation on how to use RPE scale       Expected Outcomes  Short Term: Able to use RPE daily in rehab to express subjective intensity level;Long Term:  Able to use RPE to guide intensity level when exercising independently       Able to understand and use Dyspnea scale  Yes       Intervention  Provide education and explanation on how to use Dyspnea scale       Expected Outcomes  Short Term: Able to use Dyspnea scale daily in rehab to express subjective sense of shortness of breath during exertion;Long Term: Able to use  Dyspnea scale to guide intensity level when exercising independently       Knowledge and understanding of Target Heart Rate Range (THRR)  Yes       Intervention  Provide education and explanation of THRR including how the numbers were predicted and where they are located for reference       Expected Outcomes  Short Term: Able to state/look up THRR;Short Term: Able to use daily as guideline for intensity in rehab;Long Term: Able to use THRR to govern intensity when exercising independently       Able to check pulse independently  Yes       Intervention  Provide education and demonstration on how to check pulse in carotid and radial arteries.;Review the importance of being able to check your own pulse for safety during independent exercise       Expected Outcomes  Short Term: Able to explain why pulse checking is important during independent exercise;Long Term: Able to check pulse independently and accurately       Understanding of Exercise Prescription  Yes       Intervention  Provide education, explanation, and written materials on patient's individual exercise  prescription       Expected Outcomes  Short Term: Able to explain program exercise prescription;Long Term: Able to explain home exercise prescription to exercise independently          Exercise Goals Re-Evaluation : Exercise Goals Re-Evaluation    Row Name 02/02/19 1136 02/09/19 0924           Exercise Goal Re-Evaluation   Exercise Goals Review  Increase Physical Activity;Able to understand and use rate of perceived exertion (RPE) scale;Knowledge and understanding of Target Heart Rate Range (THRR);Understanding of Exercise Prescription;Increase Strength and Stamina;Able to check pulse independently  Increase Physical Activity;Increase Strength and Stamina;Understanding of Exercise Prescription      Comments  Reviewed RPE scale, THR and program prescription with pt today.  Pt voiced understanding and was given a copy of goals to take  home.  Derrick Barajas has completed two full days of exercise.  He hurt his foot on Friday and was not able to come in.  He is off to a good start.  We will continue to monitor his progress.      Expected Outcomes  Short: Use RPE daily to regulate intensity. Long: Follow program prescription in THR.  Short: Continue to attend rehab regularly.  Long: Continue to follow program prescription.         Discharge Exercise Prescription (Final Exercise Prescription Changes): Exercise Prescription Changes - 02/09/19 0900      Response to Exercise   Blood Pressure (Admit)  148/82    Blood Pressure (Exercise)  136/74    Blood Pressure (Exit)  150/92    Heart Rate (Admit)  52 bpm    Heart Rate (Exercise)  105 bpm    Heart Rate (Exit)  96 bpm    Rating of Perceived Exertion (Exercise)  13    Symptoms  none    Duration  Progress to 30 minutes of  aerobic without signs/symptoms of physical distress    Intensity  THRR unchanged      Progression   Progression  Continue to progress workloads to maintain intensity without signs/symptoms of physical distress.    Average METs  2.41      Resistance Training   Training Prescription  Yes    Weight  3 lbs    Reps  10-15      Interval Training   Interval Training  No      Treadmill   MPH  1.6    Grade  0    Minutes  15    METs  2.23      Recumbant Bike   Level  3    Watts  26    Minutes  15    METs  2.61      NuStep   Level  1    Minutes  15    METs  1.8      Biostep-RELP   Level  3    Minutes  15    METs  3       Nutrition:  Target Goals: Understanding of nutrition guidelines, daily intake of sodium '1500mg'$ , cholesterol '200mg'$ , calories 30% from fat and 7% or less from saturated fats, daily to have 5 or more servings of fruits and vegetables.  Biometrics: Pre Biometrics - 01/29/19 1337      Pre Biometrics   Height  5' 9.5" (1.765 m)    Weight  287 lb 12.8 oz (130.5 kg)    BMI (Calculated)  41.91    Single Leg Stand  2 seconds         Nutrition Therapy Plan and Nutrition Goals: Nutrition Therapy & Goals - 01/29/19 1223      Nutrition Therapy   Diet  Low Na, HH, diabetic diet    Protein (specify units)  100g    Fiber  25 grams    Whole Grain Foods  3 servings    Saturated Fats  12 max. grams    Fruits and Vegetables  5 servings/day    Sodium  1.5 grams      Personal Nutrition Goals   Nutrition Goal  ST: continue with HH eating and label reading LT: lose weight (unspecified goal weight)    Comments  Pt reports normally eating [place] bacon with egg whites (sometimes whole eggs) with some cheese or oatmeal in the am. fried fish or baked chicken witht the skin, grain or squash, and collards, lettuce, or other greens (similar dinner). Pt will eat snowqueen diet ice cream most nights, every friday will have popcorn and snacks. once a month will have lite beer. Meals on wheels will bring him frozen regular and diet meals for the week (replaces L or D). Uses low fat dairy (aside from cheese) and will try to get olive oil or spreads made with olive oil. Disucssed HH DM diet. discussed the importance of BG control, monitoring, frequent meals, composition of meals (pt reports choosing whoel grains), discussed what to do if BG gets too low. Pt reports wanting to lose weight for his heart, discussed healthy weight loss and how healthy habits and lifestyle is more important. Pt did not want to make any changes. (discussed maybe adding protein extenders (beans) and additional veggies to frozen meals to bulk it up - can also help manage sodium).      Intervention Plan   Intervention  Prescribe, educate and counsel regarding individualized specific dietary modifications aiming towards targeted core components such as weight, hypertension, lipid management, diabetes, heart failure and other comorbidities.;Nutrition handout(s) given to patient.    Expected Outcomes  Short Term Goal: Understand basic principles of dietary content, such as  calories, fat, sodium, cholesterol and nutrients.;Short Term Goal: A plan has been developed with personal nutrition goals set during dietitian appointment.;Long Term Goal: Adherence to prescribed nutrition plan.       Nutrition Assessments: Nutrition Assessments - 01/29/19 1243      MEDFICTS Scores   Pre Score  51       Nutrition Goals Re-Evaluation:   Nutrition Goals Discharge (Final Nutrition Goals Re-Evaluation):   Psychosocial: Target Goals: Acknowledge presence or absence of significant depression and/or stress, maximize coping skills, provide positive support system. Participant is able to verbalize types and ability to use techniques and skills needed for reducing stress and depression.   Initial Review & Psychosocial Screening: Initial Psych Review & Screening - 01/26/19 1422      Initial Review   Current issues with  Current Stress Concerns    Comments  Derrick Barajas reported sleeping well, having a good support system, but very stressed about COVID. He is worried about himself and his family/friends. He is getting his meals and is financially okay for now.      Family Dynamics   Good Support System?  Yes      Barriers   Psychosocial barriers to participate in program  There are no identifiable barriers or psychosocial needs.      Screening Interventions   Interventions  Encouraged to exercise    Expected Outcomes  Short  Term goal: Utilizing psychosocial counselor, staff and physician to assist with identification of specific Stressors or current issues interfering with healing process. Setting desired goal for each stressor or current issue identified.;Long Term Goal: Stressors or current issues are controlled or eliminated.;Short Term goal: Identification and review with participant of any Quality of Life or Depression concerns found by scoring the questionnaire.;Long Term goal: The participant improves quality of Life and PHQ9 Scores as seen by post scores and/or  verbalization of changes       Quality of Life Scores:  Quality of Life - 01/29/19 1338      Quality of Life   Select  Quality of Life      Quality of Life Scores   Health/Function Pre  27.2 %    Socioeconomic Pre  28.29 %    Psych/Spiritual Pre  28.29 %    Family Pre  30 %    GLOBAL Pre  27.94 %      Scores of 19 and below usually indicate a poorer quality of life in these areas.  A difference of  2-3 points is a clinically meaningful difference.  A difference of 2-3 points in the total score of the Quality of Life Index has been associated with significant improvement in overall quality of life, self-image, physical symptoms, and general health in studies assessing change in quality of life.  PHQ-9: Recent Review Flowsheet Data    Depression screen [facility] 2/9 10/05/2015   Decreased Interest 1   Down, Depressed, Hopeless 0   PHQ - 2 Score 1     Interpretation of Total Score  Total Score Depression Severity:  1-4 = Minimal depression, 5-9 = Mild depression, 10-14 = Moderate depression, 15-19 = Moderately severe depression, 20-27 = Severe depression   Psychosocial Evaluation and Intervention:   Psychosocial Re-Evaluation:   Psychosocial Discharge (Final Psychosocial Re-Evaluation):   Vocational Rehabilitation: Provide vocational rehab assistance to qualifying candidates.   Vocational Rehab Evaluation & Intervention: Vocational Rehab - 01/26/19 1422      Initial Vocational Rehab Evaluation & Intervention   Assessment shows need for Vocational Rehabilitation  No       Education: Education Goals: Education classes will be provided on a variety of topics geared toward better understanding of heart health and risk factor modification. Participant will state understanding/return demonstration of topics presented as noted by education test scores.  Learning Barriers/Preferences: Learning Barriers/Preferences - 01/26/19 1422      Learning Barriers/Preferences   Learning  Barriers  None    Learning Preferences  Individual Instruction       Education Topics:  AED/CPR: - Group verbal and written instruction with the use of models to demonstrate the basic use of the AED with the basic ABC's of resuscitation.   General Nutrition Guidelines/Fats and Fiber: -Group instruction provided by verbal, written material, models and posters to present the general guidelines for heart healthy nutrition. Gives an explanation and review of dietary fats and fiber.   Controlling Sodium/Reading Food Labels: -Group verbal and written material supporting the discussion of sodium use in heart healthy nutrition. Review and explanation with models, verbal and written materials for utilization of the food label.   Exercise Physiology & General Exercise Guidelines: - Group verbal and written instruction with models to review the exercise physiology of the cardiovascular system and associated critical values. Provides general exercise guidelines with specific guidelines to those with heart or lung disease.    Aerobic Exercise & Resistance Training: - Gives group verbal  and written instruction on the various components of exercise. Focuses on aerobic and resistive training programs and the benefits of this training and how to safely progress through these programs..   Flexibility, Balance, Mind/Body Relaxation: Provides group verbal/written instruction on the benefits of flexibility and balance training, including mind/body exercise modes such as yoga, pilates and tai chi.  Demonstration and skill practice provided.   Stress and Anxiety: - Provides group verbal and written instruction about the health risks of elevated stress and causes of high stress.  Discuss the correlation between heart/lung disease and anxiety and treatment options. Review healthy ways to manage with stress and anxiety.   Depression: - Provides group verbal and written instruction on the correlation  between heart/lung disease and depressed mood, treatment options, and the stigmas associated with seeking treatment.   Anatomy & Physiology of the Heart: - Group verbal and written instruction and models provide basic cardiac anatomy and physiology, with the coronary electrical and arterial systems. Review of Valvular disease and Heart Failure   Cardiac Procedures: - Group verbal and written instruction to review commonly prescribed medications for heart disease. Reviews the medication, class of the drug, and side effects. Includes the steps to properly store meds and maintain the prescription regimen. (beta blockers and nitrates)   Cardiac Medications I: - Group verbal and written instruction to review commonly prescribed medications for heart disease. Reviews the medication, class of the drug, and side effects. Includes the steps to properly store meds and maintain the prescription regimen.   Cardiac Medications II: -Group verbal and written instruction to review commonly prescribed medications for heart disease. Reviews the medication, class of the drug, and side effects. (all other drug classes)    Go Sex-Intimacy & Heart Disease, Get SMART - Goal Setting: - Group verbal and written instruction through game format to discuss heart disease and the return to sexual intimacy. Provides group verbal and written material to discuss and apply goal setting through the application of the S.M.A.R.T. Method.   Other Matters of the Heart: - Provides group verbal, written materials and models to describe Stable Angina and Peripheral Artery. Includes description of the disease process and treatment options available to the cardiac patient.   Exercise & Equipment Safety: - Individual verbal instruction and demonstration of equipment use and safety with use of the equipment.   Cardiac Rehab from 01/29/2019 in [facility] Cardiac and Pulmonary Rehab  Date  01/29/19  Educator  [facility]  Instruction Review Code   1- Verbalizes Understanding      Infection Prevention: - Provides verbal and written material to individual with discussion of infection control including proper hand washing and proper equipment cleaning during exercise session.   Cardiac Rehab from 01/29/2019 in [facility] Cardiac and Pulmonary Rehab  Date  01/29/19  Educator  [facility]  Instruction Review Code  1- Verbalizes Understanding      Falls Prevention: - Provides verbal and written material to individual with discussion of falls prevention and safety.   Cardiac Rehab from 01/29/2019 in [facility] Cardiac and Pulmonary Rehab  Date  01/29/19  Educator  [facility]  Instruction Review Code  1- Verbalizes Understanding      Diabetes: - Individual verbal and written instruction to review signs/symptoms of diabetes, desired ranges of glucose level fasting, after meals and with exercise. Acknowledge that pre and post exercise glucose checks will be done for 3 sessions at entry of program.   Cardiac Rehab from 01/29/2019 in [facility]  Date  01/26/19  Educator  [place]  Instruction Review Code  1- Verbalizes Understanding      Know Your Numbers and Risk Factors: -Group verbal and written instruction about important numbers in your health.  Discussion of what are risk factors and how they play a role in the disease process.  Review of Cholesterol, Blood Pressure, Diabetes, and BMI and the role they play in your overall health.   Sleep Hygiene: -Provides group verbal and written instruction about how sleep can affect your health.  Define sleep hygiene, discuss sleep cycles and impact of sleep habits. Review good sleep hygiene tips.    Other: -Provides group and verbal instruction on various topics (see comments)   Knowledge Questionnaire Score: Knowledge Questionnaire Score - 01/29/19 1338      Knowledge Questionnaire Score   Pre Score  18/23   Education Focus: MI, A&P, Diabetes, PAD, Nutrition, Exercise, Cessation      Core  Components/Risk Factors/Patient Goals at Admission: Personal Goals and Risk Factors at Admission - 01/29/19 1341      Core Components/Risk Factors/Patient Goals on Admission    Weight Management  Yes;Obesity;Weight Loss    Intervention  Weight Management: Develop a combined nutrition and exercise program designed to reach desired caloric intake, while maintaining appropriate intake of nutrient and fiber, sodium and fats, and appropriate energy expenditure required for the weight goal.;Weight Management: Provide education and appropriate resources to help participant work on and attain dietary goals.;Weight Management/Obesity: Establish reasonable short term and long term weight goals.;Obesity: Provide education and appropriate resources to help participant work on and attain dietary goals.    Admit Weight  287 lb 12.8 oz (130.5 kg)    Goal Weight: Short Term  282 lb (127.9 kg)    Goal Weight: Long Term  270 lb (122.5 kg)    Expected Outcomes  Short Term: Continue to assess and modify interventions until short term weight is achieved;Long Term: Adherence to nutrition and physical activity/exercise program aimed toward attainment of established weight goal;Weight Loss: Understanding of general recommendations for a balanced deficit meal plan, which promotes 1-2 lb weight loss per week and includes a negative energy balance of [phone] kcal/d;Understanding recommendations for meals to include 15-35% energy as protein, 25-35% energy from fat, 35-60% energy from carbohydrates, less than '200mg'$  of dietary cholesterol, 20-35 gm of total fiber daily;Understanding of distribution of calorie intake throughout the day with the consumption of 4-5 meals/snacks    Diabetes  Yes    Intervention  Provide education about signs/symptoms and action to take for hypo/hyperglycemia.;Provide education about proper nutrition, including hydration, and aerobic/resistive exercise prescription along with prescribed medications to  achieve blood glucose in normal ranges: Fasting glucose 65-99 mg/dL    Expected Outcomes  Short Term: Participant verbalizes understanding of the signs/symptoms and immediate care of hyper/hypoglycemia, proper foot care and importance of medication, aerobic/resistive exercise and nutrition plan for blood glucose control.;Long Term: Attainment of HbA1C < 7%.    Heart Failure  Yes    Intervention  Provide a combined exercise and nutrition program that is supplemented with education, support and counseling about heart failure. Directed toward relieving symptoms such as shortness of breath, decreased exercise tolerance, and extremity edema.    Expected Outcomes  Improve functional capacity of life;Short term: Attendance in program 2-3 days a week with increased exercise capacity. Reported lower sodium intake. Reported increased fruit and vegetable intake. Reports medication compliance.;Short term: Daily weights obtained and reported for increase. Utilizing diuretic protocols set by physician.;Long term:  Adoption of self-care skills and reduction of barriers for early signs and symptoms recognition and intervention leading to self-care maintenance.    Hypertension  Yes    Intervention  Provide education on lifestyle modifcations including regular physical activity/exercise, weight management, moderate sodium restriction and increased consumption of fresh fruit, vegetables, and low fat dairy, alcohol moderation, and smoking cessation.;Monitor prescription use compliance.    Expected Outcomes  Short Term: Continued assessment and intervention until BP is < 140/28m HG in hypertensive participants. < 130/840mHG in hypertensive participants with diabetes, heart failure or chronic kidney disease.;Long Term: Maintenance of blood pressure at goal levels.    Lipids  Yes    Intervention  Provide education and support for participant on nutrition & aerobic/resistive exercise along with prescribed medications to achieve  LDL '70mg'$ , HDL >'40mg'$ .    Expected Outcomes  Short Term: Participant states understanding of desired cholesterol values and is compliant with medications prescribed. Participant is following exercise prescription and nutrition guidelines.;Long Term: Cholesterol controlled with medications as prescribed, with individualized exercise RX and with personalized nutrition plan. Value goals: LDL < '70mg'$ , HDL > 40 mg.       Core Components/Risk Factors/Patient Goals Review:    Core Components/Risk Factors/Patient Goals at Discharge (Final Review):    ITP Comments: ITP Comments    Row Name 01/26/19 1421 01/29/19 1328 02/02/19 1136 02/06/19 1122 02/18/19 0551   ITP Comments  Virtual Orientation completed. [facility] Scheduled for EP/RD orientation 7/23 at 11  6MWT and orientation completed.  Initial ITP created and sent to Dr. BeRamonita Labovering for Medical Director Dr. MaEmily Filbert First full day of exercise!  Patient was oriented to gym and equipment including functions, settings, policies, and procedures.  Patient's individual exercise prescription and treatment plan were reviewed.  All starting workloads were established based on the results of the 6 minute walk test done at initial orientation visit.  The plan for exercise progression was also introduced and progression will be customized based on patient's performance and goals.  Returned [employer] He slipped in the driveway this morning and hurt his foot.  I encouraged him to seek care if swelling or pain increase or continue.  He hopes to return on Monday.  30 Day Review Completed today. Continue with ITP unless changed by Medical Director review.   RoWhitmerame 02/18/19 [phone]) [id number]/08/20 0911 03/18/19 0552       ITP Comments  GaJaycenalled to let usKoreanow he will not be in class until he can get his car fixed.  GaTejayays he cannot use [employer] since he has a car.  I advised him to call and see if he can use ACT[employer] since his car is not  working.  Unable to complete nutrition 30-day re-eval cycle due to inconsistent attendence  30 Day review. Continue with ITP unless directed changes per Medical Director review. Out since early Aug Transportation problems        Comments:

## 2019-03-31 ENCOUNTER — Encounter: Payer: Self-pay | Admitting: *Deleted

## 2019-03-31 DIAGNOSIS — Z955 Presence of coronary angioplasty implant and graft: Secondary | ICD-10-CM

## 2019-04-15 ENCOUNTER — Encounter: Payer: Self-pay | Admitting: *Deleted

## 2019-04-15 DIAGNOSIS — Z955 Presence of coronary angioplasty implant and graft: Secondary | ICD-10-CM

## 2019-04-15 NOTE — Progress Notes (Signed)
Cardiac Individual Treatment Plan  Patient Details  Name: Derrick Barajas MRN: [id number] Date of Birth: 1955-07-17 Referring Provider:     Cardiac Rehab from 01/29/2019 in [facility] Cardiac and Pulmonary Rehab  Referring Provider  Neoma Laming MD      Initial Encounter Date:    Cardiac Rehab from 01/29/2019 in [facility] Cardiac and Pulmonary Rehab  Date  01/29/19      Visit Diagnosis: Status post coronary artery stent placement  Patient's Home Medications on Admission:  Current Outpatient Medications:  .  ACCU-CHEK AVIVA PLUS test strip, TEST TID, Disp: , Rfl: 11 .  amLODipine (NORVASC) 10 MG tablet, Take 10 mg by mouth daily. , Disp: , Rfl:  .  aspirin EC 81 MG tablet, Take 81 mg by mouth daily., Disp: , Rfl:  .  B-D ULTRAFINE III SHORT PEN 31G X 8 MM MISC, USE UTD BID, Disp: , Rfl: 3 .  docusate sodium (COLACE) 100 MG capsule, Take 100 mg at bedtime by mouth. , Disp: , Rfl:  .  ezetimibe (ZETIA) 10 MG tablet, Take 10 mg by mouth 2 (two) times daily., Disp: , Rfl:  .  fluticasone (FLONASE) 50 MCG/ACT nasal spray, Place 1 spray into both nostrils daily. , Disp: , Rfl: 1 .  furosemide (LASIX) 20 MG tablet, Take 20 mg by mouth 2 (two) times daily. , Disp: , Rfl:  .  Insulin Degludec (TRESIBA FLEXTOUCH) 200 UNIT/ML SOPN, Inject 140 Units into the skin daily. , Disp: , Rfl:  .  INVOKANA 300 MG TABS tablet, Take 300 mg by mouth daily. , Disp: , Rfl: 0 .  isosorbide mononitrate (IMDUR) 60 MG 24 hr tablet, Take 60 mg daily by mouth., Disp: , Rfl:  .  levothyroxine (SYNTHROID, LEVOTHROID) 125 MCG tablet, Take 125 mcg by mouth daily before breakfast. , Disp: , Rfl: 2 .  metFORMIN (GLUCOPHAGE-XR) 500 MG 24 hr tablet, , Disp: , Rfl:  .  metoprolol succinate (TOPROL-XL) 100 MG 24 hr tablet, Take 100 mg daily by mouth. Take with or immediately following a meal., Disp: , Rfl:  .  mupirocin ointment (BACTROBAN) 2 %, Apply to affected area 3 times daily until healed., Disp: 22 g, Rfl: 0 .  olopatadine  (PATANOL) 0.1 % ophthalmic solution, Place 1 drop into both eyes daily., Disp: , Rfl:  .  prasugrel (EFFIENT) 10 MG TABS, Take 10 mg by mouth daily at 3 pm. , Disp: , Rfl:  .  ramipril (ALTACE) 10 MG capsule, Take 10 mg by mouth daily. , Disp: , Rfl: 2 .  ranolazine (RANEXA) 500 MG 12 hr tablet, Take 500 mg by mouth 2 (two) times daily., Disp: , Rfl:  .  rosuvastatin (CRESTOR) 40 MG tablet, Take 40 mg by mouth every evening. , Disp: , Rfl: 3 .  triamcinolone ointment (KENALOG) 0.5 %, Apply 1 application topically 2 (two) times daily., Disp: 30 g, Rfl: 0 .  TRULICITY 1.5 VH/8.4ON SOPN, Inject 1.5 mg into the skin every Wednesday. , Disp: , Rfl: 2  Past Medical History: Past Medical History:  Diagnosis Date  . CAD (coronary artery disease)   . CHF (congestive heart failure) ([place])   . Diabetes mellitus without complication ([place])   . Gout   . Hyperlipidemia   . Hypertension   . MI (myocardial infarction) ([place])   . RA (rheumatoid arthritis) ([place])   . Sleep apnea   . 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 Thyroid disease  Tobacco Use: Social History   Tobacco Use  Smoking Status Never Smoker  Smokeless Tobacco Never Used    Labs: Recent Review Flowsheet Data    Labs for ITP Cardiac and Pulmonary Rehab Latest Ref Rng & Units 08/29/2011 02/05/2017 11/18/2018   Cholestrol 0 - 200 mg/dL 139 207(H) 164   LDLCALC 0 - 99 mg/dL 64 99 82   HDL >40 mg/dL 38(L) 35(L) 63   Trlycerides <150 mg/dL 185 367(H) 97   Hemoglobin A1c 4.8 - 5.6 % 10.0(H) 7.5(H) -       Exercise Target Goals: Exercise Program Goal: Individual exercise prescription set using results from initial 6 min walk test and THRR while considering  patient's activity barriers and safety.   Exercise Prescription Goal: Initial exercise prescription builds to 30-45 minutes a day of aerobic activity, 2-3 days per week.  Home exercise guidelines will be given to patient during program as part of exercise prescription that the  participant will acknowledge.  Activity Barriers & Risk Stratification: Activity Barriers & Cardiac Risk Stratification - 01/29/19 1330      Activity Barriers & Cardiac Risk Stratification   Activity Barriers  Joint Problems;Shortness of Breath;Muscular Weakness;Deconditioning;Balance Concerns;Other (comment)    Comments  R rotator cuff tear, occasional knee pain    Cardiac Risk Stratification  High       6 Minute Walk: 6 Minute Walk    Row Name 01/29/19 1329         6 Minute Walk   Phase  Initial     Distance  1100 feet     Walk Time  6 minutes     # of Rest Breaks  0     MPH  2.08     METS  2.26     RPE  14     Perceived Dyspnea   2     VO2 Peak  7.92     Symptoms  Yes (comment)     Comments  SOB     Resting HR  74 bpm     Resting BP  134/74     Resting Oxygen Saturation   98 %     Exercise Oxygen Saturation  during 6 min walk  94 %     Max Ex. HR  107 bpm     Max Ex. BP  146/64     2 Minute Post BP  126/64        Oxygen Initial Assessment:   Oxygen Re-Evaluation:   Oxygen Discharge (Final Oxygen Re-Evaluation):   Initial Exercise Prescription: Initial Exercise Prescription - 01/29/19 1300      Date of Initial Exercise RX and Referring Provider   Date  01/29/19    Referring Provider  Neoma Laming MD      Treadmill   MPH  1.6    Grade  0    Minutes  15    METs  2.23      Recumbant Bike   Level  1    RPM  50    Watts  10    Minutes  15    METs  2.2      NuStep   Level  1    SPM  80    Minutes  15    METs  2      Biostep-RELP   Level  1    SPM  50    Minutes  15    METs  2  Prescription Details   Frequency (times per week)  3    Duration  Progress to 30 minutes of continuous aerobic without signs/symptoms of physical distress      Intensity   THRR 40-80% of Max Heartrate  107-140    Ratings of Perceived Exertion  11-13    Perceived Dyspnea  0-4      Progression   Progression  Continue to progress workloads to maintain  intensity without signs/symptoms of physical distress.      Resistance Training   Training Prescription  Yes    Weight  3 lbs    Reps  10-15       Perform Capillary Blood Glucose checks as needed.  Exercise Prescription Changes: Exercise Prescription Changes    Row Name 01/29/19 1300 02/09/19 0900           Response to Exercise   Blood Pressure (Admit)  134/74  148/82      Blood Pressure (Exercise)  146/64  136/74      Blood Pressure (Exit)  126/64  150/92      Heart Rate (Admit)  74 bpm  52 bpm      Heart Rate (Exercise)  107 bpm  105 bpm      Heart Rate (Exit)  75 bpm  96 bpm      Oxygen Saturation (Admit)  98 %  -      Oxygen Saturation (Exercise)  94 %  -      Rating of Perceived Exertion (Exercise)  14  13      Perceived Dyspnea (Exercise)  2  -      Symptoms  SOB  none      Comments  walk test results  -      Duration  -  Progress to 30 minutes of  aerobic without signs/symptoms of physical distress      Intensity  -  THRR unchanged        Progression   Progression  -  Continue to progress workloads to maintain intensity without signs/symptoms of physical distress.      Average METs  -  2.41        Resistance Training   Training Prescription  -  Yes      Weight  -  3 lbs      Reps  -  10-15        Interval Training   Interval Training  -  No        Treadmill   MPH  -  1.6      Grade  -  0      Minutes  -  15      METs  -  2.23        Recumbant Bike   Level  -  3      Watts  -  26      Minutes  -  15      METs  -  2.61        NuStep   Level  -  1      Minutes  -  15      METs  -  1.8        Biostep-RELP   Level  -  3      Minutes  -  15      METs  -  3         Exercise Comments: Exercise Comments    Row Name  02/27/19 0850           Exercise Comments  Nijee has not attended since 8/7.          Exercise Goals and Review: Exercise Goals    Row Name 01/29/19 1335             Exercise Goals   Increase Physical Activity  Yes        Intervention  Provide advice, education, support and counseling about physical activity/exercise needs.;Develop an individualized exercise prescription for aerobic and resistive training based on initial evaluation findings, risk stratification, comorbidities and participant's personal goals.       Expected Outcomes  Short Term: Attend rehab on a regular basis to increase amount of physical activity.;Long Term: Add in home exercise to make exercise part of routine and to increase amount of physical activity.;Long Term: Exercising regularly at least 3-5 days a week.       Increase Strength and Stamina  Yes       Intervention  Provide advice, education, support and counseling about physical activity/exercise needs.;Develop an individualized exercise prescription for aerobic and resistive training based on initial evaluation findings, risk stratification, comorbidities and participant's personal goals.       Expected Outcomes  Short Term: Increase workloads from initial exercise prescription for resistance, speed, and METs.;Short Term: Perform resistance training exercises routinely during rehab and add in resistance training at home;Long Term: Improve cardiorespiratory fitness, muscular endurance and strength as measured by increased METs and functional capacity (6MWT)       Able to understand and use rate of perceived exertion (RPE) scale  Yes       Intervention  Provide education and explanation on how to use RPE scale       Expected Outcomes  Short Term: Able to use RPE daily in rehab to express subjective intensity level;Long Term:  Able to use RPE to guide intensity level when exercising independently       Able to understand and use Dyspnea scale  Yes       Intervention  Provide education and explanation on how to use Dyspnea scale       Expected Outcomes  Short Term: Able to use Dyspnea scale daily in rehab to express subjective sense of shortness of breath during exertion;Long Term: Able to use  Dyspnea scale to guide intensity level when exercising independently       Knowledge and understanding of Target Heart Rate Range (THRR)  Yes       Intervention  Provide education and explanation of THRR including how the numbers were predicted and where they are located for reference       Expected Outcomes  Short Term: Able to state/look up THRR;Short Term: Able to use daily as guideline for intensity in rehab;Long Term: Able to use THRR to govern intensity when exercising independently       Able to check pulse independently  Yes       Intervention  Provide education and demonstration on how to check pulse in carotid and radial arteries.;Review the importance of being able to check your own pulse for safety during independent exercise       Expected Outcomes  Short Term: Able to explain why pulse checking is important during independent exercise;Long Term: Able to check pulse independently and accurately       Understanding of Exercise Prescription  Yes       Intervention  Provide education, explanation, and written materials on patient's individual exercise  prescription       Expected Outcomes  Short Term: Able to explain program exercise prescription;Long Term: Able to explain home exercise prescription to exercise independently          Exercise Goals Re-Evaluation : Exercise Goals Re-Evaluation    Row Name 02/02/19 1136 02/09/19 0924 03/31/19 1542         Exercise Goal Re-Evaluation   Exercise Goals Review  Increase Physical Activity;Able to understand and use rate of perceived exertion (RPE) scale;Knowledge and understanding of Target Heart Rate Range (THRR);Understanding of Exercise Prescription;Increase Strength and Stamina;Able to check pulse independently  Increase Physical Activity;Increase Strength and Stamina;Understanding of Exercise Prescription  -     Comments  Reviewed RPE scale, THR and program prescription with pt today.  Pt voiced understanding and was given a copy of goals  to take home.  Rahmel has completed two full days of exercise.  He hurt his foot on Friday and was not able to come in.  He is off to a good start.  We will continue to monitor his progress.  Out since last review     Expected Outcomes  Short: Use RPE daily to regulate intensity. Long: Follow program prescription in THR.  Short: Continue to attend rehab regularly.  Long: Continue to follow program prescription.  -        Discharge Exercise Prescription (Final Exercise Prescription Changes): Exercise Prescription Changes - 02/09/19 0900      Response to Exercise   Blood Pressure (Admit)  148/82    Blood Pressure (Exercise)  136/74    Blood Pressure (Exit)  150/92    Heart Rate (Admit)  52 bpm    Heart Rate (Exercise)  105 bpm    Heart Rate (Exit)  96 bpm    Rating of Perceived Exertion (Exercise)  13    Symptoms  none    Duration  Progress to 30 minutes of  aerobic without signs/symptoms of physical distress    Intensity  THRR unchanged      Progression   Progression  Continue to progress workloads to maintain intensity without signs/symptoms of physical distress.    Average METs  2.41      Resistance Training   Training Prescription  Yes    Weight  3 lbs    Reps  10-15      Interval Training   Interval Training  No      Treadmill   MPH  1.6    Grade  0    Minutes  15    METs  2.23      Recumbant Bike   Level  3    Watts  26    Minutes  15    METs  2.61      NuStep   Level  1    Minutes  15    METs  1.8      Biostep-RELP   Level  3    Minutes  15    METs  3       Nutrition:  Target Goals: Understanding of nutrition guidelines, daily intake of sodium '1500mg'$ , cholesterol '200mg'$ , calories 30% from fat and 7% or less from saturated fats, daily to have 5 or more servings of fruits and vegetables.  Biometrics: Pre Biometrics - 01/29/19 1337      Pre Biometrics   Height  5' 9.5" (1.765 m)    Weight  287 lb 12.8 oz (130.5 kg)    BMI (Calculated)  41.91  Single Leg Stand  2 seconds        Nutrition Therapy Plan and Nutrition Goals: Nutrition Therapy & Goals - 01/29/19 1223      Nutrition Therapy   Diet  Low Na, HH, diabetic diet    Protein (specify units)  100g    Fiber  25 grams    Whole Grain Foods  3 servings    Saturated Fats  12 max. grams    Fruits and Vegetables  5 servings/day    Sodium  1.5 grams      Personal Nutrition Goals   Nutrition Goal  ST: continue with HH eating and label reading LT: lose weight (unspecified goal weight)    Comments  Pt reports normally eating [place] bacon with egg whites (sometimes whole eggs) with some cheese or oatmeal in the am. fried fish or baked chicken witht the skin, grain or squash, and collards, lettuce, or other greens (similar dinner). Pt will eat snowqueen diet ice cream most nights, every friday will have popcorn and snacks. once a month will have lite beer. Meals on wheels will bring him frozen regular and diet meals for the week (replaces L or D). Uses low fat dairy (aside from cheese) and will try to get olive oil or spreads made with olive oil. Disucssed HH DM diet. discussed the importance of BG control, monitoring, frequent meals, composition of meals (pt reports choosing whoel grains), discussed what to do if BG gets too low. Pt reports wanting to lose weight for his heart, discussed healthy weight loss and how healthy habits and lifestyle is more important. Pt did not want to make any changes. (discussed maybe adding protein extenders (beans) and additional veggies to frozen meals to bulk it up - can also help manage sodium).      Intervention Plan   Intervention  Prescribe, educate and counsel regarding individualized specific dietary modifications aiming towards targeted core components such as weight, hypertension, lipid management, diabetes, heart failure and other comorbidities.;Nutrition handout(s) given to patient.    Expected Outcomes  Short Term Goal: Understand basic  principles of dietary content, such as calories, fat, sodium, cholesterol and nutrients.;Short Term Goal: A plan has been developed with personal nutrition goals set during dietitian appointment.;Long Term Goal: Adherence to prescribed nutrition plan.       Nutrition Assessments: Nutrition Assessments - 01/29/19 1243      MEDFICTS Scores   Pre Score  51       Nutrition Goals Re-Evaluation:   Nutrition Goals Discharge (Final Nutrition Goals Re-Evaluation):   Psychosocial: Target Goals: Acknowledge presence or absence of significant depression and/or stress, maximize coping skills, provide positive support system. Participant is able to verbalize types and ability to use techniques and skills needed for reducing stress and depression.   Initial Review & Psychosocial Screening: Initial Psych Review & Screening - 01/26/19 1422      Initial Review   Current issues with  Current Stress Concerns    Comments  Trygve reported sleeping well, having a good support system, but very stressed about COVID. He is worried about himself and his family/friends. He is getting his meals and is financially okay for now.      Family Dynamics   Good Support System?  Yes      Barriers   Psychosocial barriers to participate in program  There are no identifiable barriers or psychosocial needs.      Screening Interventions   Interventions  Encouraged to exercise    Expected  Outcomes  Short Term goal: Utilizing psychosocial counselor, staff and physician to assist with identification of specific Stressors or current issues interfering with healing process. Setting desired goal for each stressor or current issue identified.;Long Term Goal: Stressors or current issues are controlled or eliminated.;Short Term goal: Identification and review with participant of any Quality of Life or Depression concerns found by scoring the questionnaire.;Long Term goal: The participant improves quality of Life and PHQ9 Scores as  seen by post scores and/or verbalization of changes       Quality of Life Scores:  Quality of Life - 01/29/19 1338      Quality of Life   Select  Quality of Life      Quality of Life Scores   Health/Function Pre  27.2 %    Socioeconomic Pre  28.29 %    Psych/Spiritual Pre  28.29 %    Family Pre  30 %    GLOBAL Pre  27.94 %      Scores of 19 and below usually indicate a poorer quality of life in these areas.  A difference of  2-3 points is a clinically meaningful difference.  A difference of 2-3 points in the total score of the Quality of Life Index has been associated with significant improvement in overall quality of life, self-image, physical symptoms, and general health in studies assessing change in quality of life.  PHQ-9: Recent Review Flowsheet Data    Depression screen [facility] 2/9 10/05/2015   Decreased Interest 1   Down, Depressed, Hopeless 0   PHQ - 2 Score 1     Interpretation of Total Score  Total Score Depression Severity:  1-4 = Minimal depression, 5-9 = Mild depression, 10-14 = Moderate depression, 15-19 = Moderately severe depression, 20-27 = Severe depression   Psychosocial Evaluation and Intervention:   Psychosocial Re-Evaluation:   Psychosocial Discharge (Final Psychosocial Re-Evaluation):   Vocational Rehabilitation: Provide vocational rehab assistance to qualifying candidates.   Vocational Rehab Evaluation & Intervention: Vocational Rehab - 01/26/19 1422      Initial Vocational Rehab Evaluation & Intervention   Assessment shows need for Vocational Rehabilitation  No       Education: Education Goals: Education classes will be provided on a variety of topics geared toward better understanding of heart health and risk factor modification. Participant will state understanding/return demonstration of topics presented as noted by education test scores.  Learning Barriers/Preferences: Learning Barriers/Preferences - 01/26/19 1422      Learning  Barriers/Preferences   Learning Barriers  None    Learning Preferences  Individual Instruction       Education Topics:  AED/CPR: - Group verbal and written instruction with the use of models to demonstrate the basic use of the AED with the basic ABC's of resuscitation.   General Nutrition Guidelines/Fats and Fiber: -Group instruction provided by verbal, written material, models and posters to present the general guidelines for heart healthy nutrition. Gives an explanation and review of dietary fats and fiber.   Controlling Sodium/Reading Food Labels: -Group verbal and written material supporting the discussion of sodium use in heart healthy nutrition. Review and explanation with models, verbal and written materials for utilization of the food label.   Exercise Physiology & General Exercise Guidelines: - Group verbal and written instruction with models to review the exercise physiology of the cardiovascular system and associated critical values. Provides general exercise guidelines with specific guidelines to those with heart or lung disease.    Aerobic Exercise & Resistance Training: -  Gives group verbal and written instruction on the various components of exercise. Focuses on aerobic and resistive training programs and the benefits of this training and how to safely progress through these programs..   Flexibility, Balance, Mind/Body Relaxation: Provides group verbal/written instruction on the benefits of flexibility and balance training, including mind/body exercise modes such as yoga, pilates and tai chi.  Demonstration and skill practice provided.   Stress and Anxiety: - Provides group verbal and written instruction about the health risks of elevated stress and causes of high stress.  Discuss the correlation between heart/lung disease and anxiety and treatment options. Review healthy ways to manage with stress and anxiety.   Depression: - Provides group verbal and written  instruction on the correlation between heart/lung disease and depressed mood, treatment options, and the stigmas associated with seeking treatment.   Anatomy & Physiology of the Heart: - Group verbal and written instruction and models provide basic cardiac anatomy and physiology, with the coronary electrical and arterial systems. Review of Valvular disease and Heart Failure   Cardiac Procedures: - Group verbal and written instruction to review commonly prescribed medications for heart disease. Reviews the medication, class of the drug, and side effects. Includes the steps to properly store meds and maintain the prescription regimen. (beta blockers and nitrates)   Cardiac Medications I: - Group verbal and written instruction to review commonly prescribed medications for heart disease. Reviews the medication, class of the drug, and side effects. Includes the steps to properly store meds and maintain the prescription regimen.   Cardiac Medications II: -Group verbal and written instruction to review commonly prescribed medications for heart disease. Reviews the medication, class of the drug, and side effects. (all other drug classes)    Go Sex-Intimacy & Heart Disease, Get SMART - Goal Setting: - Group verbal and written instruction through game format to discuss heart disease and the return to sexual intimacy. Provides group verbal and written material to discuss and apply goal setting through the application of the S.M.A.R.T. Method.   Other Matters of the Heart: - Provides group verbal, written materials and models to describe Stable Angina and Peripheral Artery. Includes description of the disease process and treatment options available to the cardiac patient.   Exercise & Equipment Safety: - Individual verbal instruction and demonstration of equipment use and safety with use of the equipment.   Cardiac Rehab from 01/29/2019 in [facility] Cardiac and Pulmonary Rehab  Date  01/29/19  Educator   [facility]  Instruction Review Code  1- Verbalizes Understanding      Infection Prevention: - Provides verbal and written material to individual with discussion of infection control including proper hand washing and proper equipment cleaning during exercise session.   Cardiac Rehab from 01/29/2019 in [facility] Cardiac and Pulmonary Rehab  Date  01/29/19  Educator  [facility]  Instruction Review Code  1- Verbalizes Understanding      Falls Prevention: - Provides verbal and written material to individual with discussion of falls prevention and safety.   Cardiac Rehab from 01/29/2019 in [facility] Cardiac and Pulmonary Rehab  Date  01/29/19  Educator  [facility]  Instruction Review Code  1- Verbalizes Understanding      Diabetes: - Individual verbal and written instruction to review signs/symptoms of diabetes, desired ranges of glucose level fasting, after meals and with exercise. Acknowledge that pre and post exercise glucose checks will be done for 3 sessions at entry of program.   Cardiac Rehab from 01/29/2019 in [facility]  Date  01/26/19  Educator  [place]  Instruction Review Code  1- Verbalizes Understanding      Know Your Numbers and Risk Factors: -Group verbal and written instruction about important numbers in your health.  Discussion of what are risk factors and how they play a role in the disease process.  Review of Cholesterol, Blood Pressure, Diabetes, and BMI and the role they play in your overall health.   Sleep Hygiene: -Provides group verbal and written instruction about how sleep can affect your health.  Define sleep hygiene, discuss sleep cycles and impact of sleep habits. Review good sleep hygiene tips.    Other: -Provides group and verbal instruction on various topics (see comments)   Knowledge Questionnaire Score: Knowledge Questionnaire Score - 01/29/19 1338      Knowledge Questionnaire Score   Pre Score  18/23   Education Focus: MI, A&P, Diabetes, PAD, Nutrition,  Exercise, Cessation      Core Components/Risk Factors/Patient Goals at Admission: Personal Goals and Risk Factors at Admission - 01/29/19 1341      Core Components/Risk Factors/Patient Goals on Admission    Weight Management  Yes;Obesity;Weight Loss    Intervention  Weight Management: Develop a combined nutrition and exercise program designed to reach desired caloric intake, while maintaining appropriate intake of nutrient and fiber, sodium and fats, and appropriate energy expenditure required for the weight goal.;Weight Management: Provide education and appropriate resources to help participant work on and attain dietary goals.;Weight Management/Obesity: Establish reasonable short term and long term weight goals.;Obesity: Provide education and appropriate resources to help participant work on and attain dietary goals.    Admit Weight  287 lb 12.8 oz (130.5 kg)    Goal Weight: Short Term  282 lb (127.9 kg)    Goal Weight: Long Term  270 lb (122.5 kg)    Expected Outcomes  Short Term: Continue to assess and modify interventions until short term weight is achieved;Long Term: Adherence to nutrition and physical activity/exercise program aimed toward attainment of established weight goal;Weight Loss: Understanding of general recommendations for a balanced deficit meal plan, which promotes 1-2 lb weight loss per week and includes a negative energy balance of [phone] kcal/d;Understanding recommendations for meals to include 15-35% energy as protein, 25-35% energy from fat, 35-60% energy from carbohydrates, less than '200mg'$  of dietary cholesterol, 20-35 gm of total fiber daily;Understanding of distribution of calorie intake throughout the day with the consumption of 4-5 meals/snacks    Diabetes  Yes    Intervention  Provide education about signs/symptoms and action to take for hypo/hyperglycemia.;Provide education about proper nutrition, including hydration, and aerobic/resistive exercise prescription along  with prescribed medications to achieve blood glucose in normal ranges: Fasting glucose 65-99 mg/dL    Expected Outcomes  Short Term: Participant verbalizes understanding of the signs/symptoms and immediate care of hyper/hypoglycemia, proper foot care and importance of medication, aerobic/resistive exercise and nutrition plan for blood glucose control.;Long Term: Attainment of HbA1C < 7%.    Heart Failure  Yes    Intervention  Provide a combined exercise and nutrition program that is supplemented with education, support and counseling about heart failure. Directed toward relieving symptoms such as shortness of breath, decreased exercise tolerance, and extremity edema.    Expected Outcomes  Improve functional capacity of life;Short term: Attendance in program 2-3 days a week with increased exercise capacity. Reported lower sodium intake. Reported increased fruit and vegetable intake. Reports medication compliance.;Short term: Daily weights obtained and reported for increase. Utilizing diuretic protocols set by  physician.;Long term: Adoption of self-care skills and reduction of barriers for early signs and symptoms recognition and intervention leading to self-care maintenance.    Hypertension  Yes    Intervention  Provide education on lifestyle modifcations including regular physical activity/exercise, weight management, moderate sodium restriction and increased consumption of fresh fruit, vegetables, and low fat dairy, alcohol moderation, and smoking cessation.;Monitor prescription use compliance.    Expected Outcomes  Short Term: Continued assessment and intervention until BP is < 140/62m HG in hypertensive participants. < 130/87mHG in hypertensive participants with diabetes, heart failure or chronic kidney disease.;Long Term: Maintenance of blood pressure at goal levels.    Lipids  Yes    Intervention  Provide education and support for participant on nutrition & aerobic/resistive exercise along with  prescribed medications to achieve LDL '70mg'$ , HDL >'40mg'$ .    Expected Outcomes  Short Term: Participant states understanding of desired cholesterol values and is compliant with medications prescribed. Participant is following exercise prescription and nutrition guidelines.;Long Term: Cholesterol controlled with medications as prescribed, with individualized exercise RX and with personalized nutrition plan. Value goals: LDL < '70mg'$ , HDL > 40 mg.       Core Components/Risk Factors/Patient Goals Review:    Core Components/Risk Factors/Patient Goals at Discharge (Final Review):    ITP Comments: ITP Comments    Row Name 01/26/19 1421 01/29/19 1328 02/02/19 1136 02/06/19 1122 02/18/19 0551   ITP Comments  Virtual Orientation completed. [facility] Scheduled for EP/RD orientation 7/23 at 11  6MWT and orientation completed.  Initial ITP created and sent to Dr. BeRamonita Labovering for Medical Director Dr. MaEmily Filbert First full day of exercise!  Patient was oriented to gym and equipment including functions, settings, policies, and procedures.  Patient's individual exercise prescription and treatment plan were reviewed.  All starting workloads were established based on the results of the 6 minute walk test done at initial orientation visit.  The plan for exercise progression was also introduced and progression will be customized based on patient's performance and goals.  Returned [employer] He slipped in the driveway this morning and hurt his foot.  I encouraged him to seek care if swelling or pain increase or continue.  He hopes to return on Monday.  30 Day Review Completed today. Continue with ITP unless changed by Medical Director review.   RoDarwiname 02/18/19 [id number] 03/18/19 0552 03/31/19 1541 04/15/19 1338   ITP Comments  GaHoy Mornalled to let usKoreanow he will not be in class until he can get his car fixed.  GaKenethays he cannot use ACTA since he has a car.  I advised him  to call and see if he can use ACTA since his car is not working.  Unable to complete nutrition 30-day re-eval cycle due to inconsistent attendence  30 Day review. Continue with ITP unless directed changes per Medical Director review. Out since early Aug Transportation problems  Continues to have issues with transportation.  Department is learning about new options.  Still out with transportation issues.  30 day review completed. ITP sent to Dr. MaEmily FilbertMedical Director of Cardiac and Pulmonary Rehab. Continue with ITP unless changes are made by physician.  Department closed starting 10/2 until further notice by infection prevention and Health at Work teams for [place]     Comments: 30 day review

## 2019-04-21 ENCOUNTER — Encounter: Payer: Self-pay | Admitting: *Deleted

## 2019-04-21 DIAGNOSIS — Z955 Presence of coronary angioplasty implant and graft: Secondary | ICD-10-CM

## 2019-04-29 NOTE — Progress Notes (Signed)
Has not returned to [facility] due to transportation issues

## 2019-05-13 ENCOUNTER — Encounter: Payer: Self-pay | Admitting: *Deleted

## 2019-05-13 DIAGNOSIS — Z955 Presence of coronary angioplasty implant and graft: Secondary | ICD-10-CM

## 2019-05-13 NOTE — Progress Notes (Signed)
Cardiac Individual Treatment Plan  Patient Details  Name: Derrick Barajas MRN: [id number] Date of Birth: 1955-07-17 Referring Provider:     Cardiac Rehab from 01/29/2019 in [facility] Cardiac and Pulmonary Rehab  Referring Provider  Neoma Laming MD      Initial Encounter Date:    Cardiac Rehab from 01/29/2019 in [facility] Cardiac and Pulmonary Rehab  Date  01/29/19      Visit Diagnosis: Status post coronary artery stent placement  Patient's Home Medications on Admission:  Current Outpatient Medications:  .  ACCU-CHEK AVIVA PLUS test strip, TEST TID, Disp: , Rfl: 11 .  amLODipine (NORVASC) 10 MG tablet, Take 10 mg by mouth daily. , Disp: , Rfl:  .  aspirin EC 81 MG tablet, Take 81 mg by mouth daily., Disp: , Rfl:  .  B-D ULTRAFINE III SHORT PEN 31G X 8 MM MISC, USE UTD BID, Disp: , Rfl: 3 .  docusate sodium (COLACE) 100 MG capsule, Take 100 mg at bedtime by mouth. , Disp: , Rfl:  .  ezetimibe (ZETIA) 10 MG tablet, Take 10 mg by mouth 2 (two) times daily., Disp: , Rfl:  .  fluticasone (FLONASE) 50 MCG/ACT nasal spray, Place 1 spray into both nostrils daily. , Disp: , Rfl: 1 .  furosemide (LASIX) 20 MG tablet, Take 20 mg by mouth 2 (two) times daily. , Disp: , Rfl:  .  Insulin Degludec (TRESIBA FLEXTOUCH) 200 UNIT/ML SOPN, Inject 140 Units into the skin daily. , Disp: , Rfl:  .  INVOKANA 300 MG TABS tablet, Take 300 mg by mouth daily. , Disp: , Rfl: 0 .  isosorbide mononitrate (IMDUR) 60 MG 24 hr tablet, Take 60 mg daily by mouth., Disp: , Rfl:  .  levothyroxine (SYNTHROID, LEVOTHROID) 125 MCG tablet, Take 125 mcg by mouth daily before breakfast. , Disp: , Rfl: 2 .  metFORMIN (GLUCOPHAGE-XR) 500 MG 24 hr tablet, , Disp: , Rfl:  .  metoprolol succinate (TOPROL-XL) 100 MG 24 hr tablet, Take 100 mg daily by mouth. Take with or immediately following a meal., Disp: , Rfl:  .  mupirocin ointment (BACTROBAN) 2 %, Apply to affected area 3 times daily until healed., Disp: 22 g, Rfl: 0 .  olopatadine  (PATANOL) 0.1 % ophthalmic solution, Place 1 drop into both eyes daily., Disp: , Rfl:  .  prasugrel (EFFIENT) 10 MG TABS, Take 10 mg by mouth daily at 3 pm. , Disp: , Rfl:  .  ramipril (ALTACE) 10 MG capsule, Take 10 mg by mouth daily. , Disp: , Rfl: 2 .  ranolazine (RANEXA) 500 MG 12 hr tablet, Take 500 mg by mouth 2 (two) times daily., Disp: , Rfl:  .  rosuvastatin (CRESTOR) 40 MG tablet, Take 40 mg by mouth every evening. , Disp: , Rfl: 3 .  triamcinolone ointment (KENALOG) 0.5 %, Apply 1 application topically 2 (two) times daily., Disp: 30 g, Rfl: 0 .  TRULICITY 1.5 VH/8.4ON SOPN, Inject 1.5 mg into the skin every Wednesday. , Disp: , Rfl: 2  Past Medical History: Past Medical History:  Diagnosis Date  . CAD (coronary artery disease)   . CHF (congestive heart failure) ([place])   . Diabetes mellitus without complication ([place])   . Gout   . Hyperlipidemia   . Hypertension   . MI (myocardial infarction) ([place])   . RA (rheumatoid arthritis) ([place])   . Sleep apnea   . 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 Thyroid disease  Tobacco Use: Social History   Tobacco Use  Smoking Status Never Smoker  Smokeless Tobacco Never Used    Labs: Recent Review Flowsheet Data    Labs for ITP Cardiac and Pulmonary Rehab Latest Ref Rng & Units 08/29/2011 02/05/2017 11/18/2018   Cholestrol 0 - 200 mg/dL 139 207(H) 164   LDLCALC 0 - 99 mg/dL 64 99 82   HDL >40 mg/dL 38(L) 35(L) 63   Trlycerides <150 mg/dL 185 367(H) 97   Hemoglobin A1c 4.8 - 5.6 % 10.0(H) 7.5(H) -       Exercise Target Goals: Exercise Program Goal: Individual exercise prescription set using results from initial 6 min walk test and THRR while considering  patient's activity barriers and safety.   Exercise Prescription Goal: Initial exercise prescription builds to 30-45 minutes a day of aerobic activity, 2-3 days per week.  Home exercise guidelines will be given to patient during program as part of exercise prescription that the  participant will acknowledge.  Activity Barriers & Risk Stratification: Activity Barriers & Cardiac Risk Stratification - 01/29/19 1330      Activity Barriers & Cardiac Risk Stratification   Activity Barriers  Joint Problems;Shortness of Breath;Muscular Weakness;Deconditioning;Balance Concerns;Other (comment)    Comments  R rotator cuff tear, occasional knee pain    Cardiac Risk Stratification  High       6 Minute Walk: 6 Minute Walk    Row Name 01/29/19 1329         6 Minute Walk   Phase  Initial     Distance  1100 feet     Walk Time  6 minutes     # of Rest Breaks  0     MPH  2.08     METS  2.26     RPE  14     Perceived Dyspnea   2     VO2 Peak  7.92     Symptoms  Yes (comment)     Comments  SOB     Resting HR  74 bpm     Resting BP  134/74     Resting Oxygen Saturation   98 %     Exercise Oxygen Saturation  during 6 min walk  94 %     Max Ex. HR  107 bpm     Max Ex. BP  146/64     2 Minute Post BP  126/64        Oxygen Initial Assessment:   Oxygen Re-Evaluation:   Oxygen Discharge (Final Oxygen Re-Evaluation):   Initial Exercise Prescription: Initial Exercise Prescription - 01/29/19 1300      Date of Initial Exercise RX and Referring Provider   Date  01/29/19    Referring Provider  Neoma Laming MD      Treadmill   MPH  1.6    Grade  0    Minutes  15    METs  2.23      Recumbant Bike   Level  1    RPM  50    Watts  10    Minutes  15    METs  2.2      NuStep   Level  1    SPM  80    Minutes  15    METs  2      Biostep-RELP   Level  1    SPM  50    Minutes  15    METs  2  Prescription Details   Frequency (times per week)  3    Duration  Progress to 30 minutes of continuous aerobic without signs/symptoms of physical distress      Intensity   THRR 40-80% of Max Heartrate  107-140    Ratings of Perceived Exertion  11-13    Perceived Dyspnea  0-4      Progression   Progression  Continue to progress workloads to maintain  intensity without signs/symptoms of physical distress.      Resistance Training   Training Prescription  Yes    Weight  3 lbs    Reps  10-15       Perform Capillary Blood Glucose checks as needed.  Exercise Prescription Changes: Exercise Prescription Changes    Row Name 01/29/19 1300 02/09/19 0900           Response to Exercise   Blood Pressure (Admit)  134/74  148/82      Blood Pressure (Exercise)  146/64  136/74      Blood Pressure (Exit)  126/64  150/92      Heart Rate (Admit)  74 bpm  52 bpm      Heart Rate (Exercise)  107 bpm  105 bpm      Heart Rate (Exit)  75 bpm  96 bpm      Oxygen Saturation (Admit)  98 %  -      Oxygen Saturation (Exercise)  94 %  -      Rating of Perceived Exertion (Exercise)  14  13      Perceived Dyspnea (Exercise)  2  -      Symptoms  SOB  none      Comments  walk test results  -      Duration  -  Progress to 30 minutes of  aerobic without signs/symptoms of physical distress      Intensity  -  THRR unchanged        Progression   Progression  -  Continue to progress workloads to maintain intensity without signs/symptoms of physical distress.      Average METs  -  2.41        Resistance Training   Training Prescription  -  Yes      Weight  -  3 lbs      Reps  -  10-15        Interval Training   Interval Training  -  No        Treadmill   MPH  -  1.6      Grade  -  0      Minutes  -  15      METs  -  2.23        Recumbant Bike   Level  -  3      Watts  -  26      Minutes  -  15      METs  -  2.61        NuStep   Level  -  1      Minutes  -  15      METs  -  1.8        Biostep-RELP   Level  -  3      Minutes  -  15      METs  -  3         Exercise Comments: Exercise Comments    Row Name  02/27/19 0850           Exercise Comments  Derrick Barajas has not attended since 8/7.          Exercise Goals and Review: Exercise Goals    Row Name 01/29/19 1335             Exercise Goals   Increase Physical Activity  Yes        Intervention  Provide advice, education, support and counseling about physical activity/exercise needs.;Develop an individualized exercise prescription for aerobic and resistive training based on initial evaluation findings, risk stratification, comorbidities and participant's personal goals.       Expected Outcomes  Short Term: Attend rehab on a regular basis to increase amount of physical activity.;Long Term: Add in home exercise to make exercise part of routine and to increase amount of physical activity.;Long Term: Exercising regularly at least 3-5 days a week.       Increase Strength and Stamina  Yes       Intervention  Provide advice, education, support and counseling about physical activity/exercise needs.;Develop an individualized exercise prescription for aerobic and resistive training based on initial evaluation findings, risk stratification, comorbidities and participant's personal goals.       Expected Outcomes  Short Term: Increase workloads from initial exercise prescription for resistance, speed, and METs.;Short Term: Perform resistance training exercises routinely during rehab and add in resistance training at home;Long Term: Improve cardiorespiratory fitness, muscular endurance and strength as measured by increased METs and functional capacity (6MWT)       Able to understand and use rate of perceived exertion (RPE) scale  Yes       Intervention  Provide education and explanation on how to use RPE scale       Expected Outcomes  Short Term: Able to use RPE daily in rehab to express subjective intensity level;Long Term:  Able to use RPE to guide intensity level when exercising independently       Able to understand and use Dyspnea scale  Yes       Intervention  Provide education and explanation on how to use Dyspnea scale       Expected Outcomes  Short Term: Able to use Dyspnea scale daily in rehab to express subjective sense of shortness of breath during exertion;Long Term: Able to use  Dyspnea scale to guide intensity level when exercising independently       Knowledge and understanding of Target Heart Rate Range (THRR)  Yes       Intervention  Provide education and explanation of THRR including how the numbers were predicted and where they are located for reference       Expected Outcomes  Short Term: Able to state/look up THRR;Short Term: Able to use daily as guideline for intensity in rehab;Long Term: Able to use THRR to govern intensity when exercising independently       Able to check pulse independently  Yes       Intervention  Provide education and demonstration on how to check pulse in carotid and radial arteries.;Review the importance of being able to check your own pulse for safety during independent exercise       Expected Outcomes  Short Term: Able to explain why pulse checking is important during independent exercise;Long Term: Able to check pulse independently and accurately       Understanding of Exercise Prescription  Yes       Intervention  Provide education, explanation, and written materials on patient's individual exercise  prescription       Expected Outcomes  Short Term: Able to explain program exercise prescription;Long Term: Able to explain home exercise prescription to exercise independently          Exercise Goals Re-Evaluation : Exercise Goals Re-Evaluation    Row Name 02/02/19 1136 02/09/19 0924 03/31/19 1542 04/21/19 1009       Exercise Goal Re-Evaluation   Exercise Goals Review  Increase Physical Activity;Able to understand and use rate of perceived exertion (RPE) scale;Knowledge and understanding of Target Heart Rate Range (THRR);Understanding of Exercise Prescription;Increase Strength and Stamina;Able to check pulse independently  Increase Physical Activity;Increase Strength and Stamina;Understanding of Exercise Prescription  -  -    Comments  Reviewed RPE scale, THR and program prescription with pt today.  Pt voiced understanding and was given a  copy of goals to take home.  Derrick Barajas has completed two full days of exercise.  He hurt his foot on Friday and was not able to come in.  He is off to a good start.  We will continue to monitor his progress.  Out since last review  Out since last review    Expected Outcomes  Short: Use RPE daily to regulate intensity. Long: Follow program prescription in THR.  Short: Continue to attend rehab regularly.  Long: Continue to follow program prescription.  -  -       Discharge Exercise Prescription (Final Exercise Prescription Changes): Exercise Prescription Changes - 02/09/19 0900      Response to Exercise   Blood Pressure (Admit)  148/82    Blood Pressure (Exercise)  136/74    Blood Pressure (Exit)  150/92    Heart Rate (Admit)  52 bpm    Heart Rate (Exercise)  105 bpm    Heart Rate (Exit)  96 bpm    Rating of Perceived Exertion (Exercise)  13    Symptoms  none    Duration  Progress to 30 minutes of  aerobic without signs/symptoms of physical distress    Intensity  THRR unchanged      Progression   Progression  Continue to progress workloads to maintain intensity without signs/symptoms of physical distress.    Average METs  2.41      Resistance Training   Training Prescription  Yes    Weight  3 lbs    Reps  10-15      Interval Training   Interval Training  No      Treadmill   MPH  1.6    Grade  0    Minutes  15    METs  2.23      Recumbant Bike   Level  3    Watts  26    Minutes  15    METs  2.61      NuStep   Level  1    Minutes  15    METs  1.8      Biostep-RELP   Level  3    Minutes  15    METs  3       Nutrition:  Target Goals: Understanding of nutrition guidelines, daily intake of sodium '1500mg'$ , cholesterol '200mg'$ , calories 30% from fat and 7% or less from saturated fats, daily to have 5 or more servings of fruits and vegetables.  Biometrics: Pre Biometrics - 01/29/19 1337      Pre Biometrics   Height  5' 9.5" (1.765 m)    Weight  287 lb 12.8 oz (130.5  kg)  BMI (Calculated)  41.91    Single Leg Stand  2 seconds        Nutrition Therapy Plan and Nutrition Goals: Nutrition Therapy & Goals - 01/29/19 1223      Nutrition Therapy   Diet  Low Na, HH, diabetic diet    Protein (specify units)  100g    Fiber  25 grams    Whole Grain Foods  3 servings    Saturated Fats  12 max. grams    Fruits and Vegetables  5 servings/day    Sodium  1.5 grams      Personal Nutrition Goals   Nutrition Goal  ST: continue with HH eating and label reading LT: lose weight (unspecified goal weight)    Comments  Pt reports normally eating [place] bacon with egg whites (sometimes whole eggs) with some cheese or oatmeal in the am. fried fish or baked chicken witht the skin, grain or squash, and collards, lettuce, or other greens (similar dinner). Pt will eat snowqueen diet ice cream most nights, every friday will have popcorn and snacks. once a month will have lite beer. Meals on wheels will bring him frozen regular and diet meals for the week (replaces L or D). Uses low fat dairy (aside from cheese) and will try to get olive oil or spreads made with olive oil. Disucssed HH DM diet. discussed the importance of BG control, monitoring, frequent meals, composition of meals (pt reports choosing whoel grains), discussed what to do if BG gets too low. Pt reports wanting to lose weight for his heart, discussed healthy weight loss and how healthy habits and lifestyle is more important. Pt did not want to make any changes. (discussed maybe adding protein extenders (beans) and additional veggies to frozen meals to bulk it up - can also help manage sodium).      Intervention Plan   Intervention  Prescribe, educate and counsel regarding individualized specific dietary modifications aiming towards targeted core components such as weight, hypertension, lipid management, diabetes, heart failure and other comorbidities.;Nutrition handout(s) given to patient.    Expected Outcomes  Short  Term Goal: Understand basic principles of dietary content, such as calories, fat, sodium, cholesterol and nutrients.;Short Term Goal: A plan has been developed with personal nutrition goals set during dietitian appointment.;Long Term Goal: Adherence to prescribed nutrition plan.       Nutrition Assessments: Nutrition Assessments - 01/29/19 1243      MEDFICTS Scores   Pre Score  51       Nutrition Goals Re-Evaluation:   Nutrition Goals Discharge (Final Nutrition Goals Re-Evaluation):   Psychosocial: Target Goals: Acknowledge presence or absence of significant depression and/or stress, maximize coping skills, provide positive support system. Participant is able to verbalize types and ability to use techniques and skills needed for reducing stress and depression.   Initial Review & Psychosocial Screening: Initial Psych Review & Screening - 01/26/19 1422      Initial Review   Current issues with  Current Stress Concerns    Comments  Derrick Barajas reported sleeping well, having a good support system, but very stressed about COVID. He is worried about himself and his family/friends. He is getting his meals and is financially okay for now.      Family Dynamics   Good Support System?  Yes      Barriers   Psychosocial barriers to participate in program  There are no identifiable barriers or psychosocial needs.      Screening Interventions   Interventions  Encouraged to exercise    Expected Outcomes  Short Term goal: Utilizing psychosocial counselor, staff and physician to assist with identification of specific Stressors or current issues interfering with healing process. Setting desired goal for each stressor or current issue identified.;Long Term Goal: Stressors or current issues are controlled or eliminated.;Short Term goal: Identification and review with participant of any Quality of Life or Depression concerns found by scoring the questionnaire.;Long Term goal: The participant improves quality  of Life and PHQ9 Scores as seen by post scores and/or verbalization of changes       Quality of Life Scores:  Quality of Life - 01/29/19 1338      Quality of Life   Select  Quality of Life      Quality of Life Scores   Health/Function Pre  27.2 %    Socioeconomic Pre  28.29 %    Psych/Spiritual Pre  28.29 %    Family Pre  30 %    GLOBAL Pre  27.94 %      Scores of 19 and below usually indicate a poorer quality of life in these areas.  A difference of  2-3 points is a clinically meaningful difference.  A difference of 2-3 points in the total score of the Quality of Life Index has been associated with significant improvement in overall quality of life, self-image, physical symptoms, and general health in studies assessing change in quality of life.  PHQ-9: Recent Review Flowsheet Data    Depression screen [facility] 2/9 10/05/2015   Decreased Interest 1   Down, Depressed, Hopeless 0   PHQ - 2 Score 1     Interpretation of Total Score  Total Score Depression Severity:  1-4 = Minimal depression, 5-9 = Mild depression, 10-14 = Moderate depression, 15-19 = Moderately severe depression, 20-27 = Severe depression   Psychosocial Evaluation and Intervention:   Psychosocial Re-Evaluation:   Psychosocial Discharge (Final Psychosocial Re-Evaluation):   Vocational Rehabilitation: Provide vocational rehab assistance to qualifying candidates.   Vocational Rehab Evaluation & Intervention: Vocational Rehab - 01/26/19 1422      Initial Vocational Rehab Evaluation & Intervention   Assessment shows need for Vocational Rehabilitation  No       Education: Education Goals: Education classes will be provided on a variety of topics geared toward better understanding of heart health and risk factor modification. Participant will state understanding/return demonstration of topics presented as noted by education test scores.  Learning Barriers/Preferences: Learning Barriers/Preferences - 01/26/19  1422      Learning Barriers/Preferences   Learning Barriers  None    Learning Preferences  Individual Instruction       Education Topics:  AED/CPR: - Group verbal and written instruction with the use of models to demonstrate the basic use of the AED with the basic ABC's of resuscitation.   General Nutrition Guidelines/Fats and Fiber: -Group instruction provided by verbal, written material, models and posters to present the general guidelines for heart healthy nutrition. Gives an explanation and review of dietary fats and fiber.   Controlling Sodium/Reading Food Labels: -Group verbal and written material supporting the discussion of sodium use in heart healthy nutrition. Review and explanation with models, verbal and written materials for utilization of the food label.   Exercise Physiology & General Exercise Guidelines: - Group verbal and written instruction with models to review the exercise physiology of the cardiovascular system and associated critical values. Provides general exercise guidelines with specific guidelines to those with heart or lung disease.  Aerobic Exercise & Resistance Training: - Gives group verbal and written instruction on the various components of exercise. Focuses on aerobic and resistive training programs and the benefits of this training and how to safely progress through these programs..   Flexibility, Balance, Mind/Body Relaxation: Provides group verbal/written instruction on the benefits of flexibility and balance training, including mind/body exercise modes such as yoga, pilates and tai chi.  Demonstration and skill practice provided.   Stress and Anxiety: - Provides group verbal and written instruction about the health risks of elevated stress and causes of high stress.  Discuss the correlation between heart/lung disease and anxiety and treatment options. Review healthy ways to manage with stress and anxiety.   Depression: - Provides group  verbal and written instruction on the correlation between heart/lung disease and depressed mood, treatment options, and the stigmas associated with seeking treatment.   Anatomy & Physiology of the Heart: - Group verbal and written instruction and models provide basic cardiac anatomy and physiology, with the coronary electrical and arterial systems. Review of Valvular disease and Heart Failure   Cardiac Procedures: - Group verbal and written instruction to review commonly prescribed medications for heart disease. Reviews the medication, class of the drug, and side effects. Includes the steps to properly store meds and maintain the prescription regimen. (beta blockers and nitrates)   Cardiac Medications I: - Group verbal and written instruction to review commonly prescribed medications for heart disease. Reviews the medication, class of the drug, and side effects. Includes the steps to properly store meds and maintain the prescription regimen.   Cardiac Medications II: -Group verbal and written instruction to review commonly prescribed medications for heart disease. Reviews the medication, class of the drug, and side effects. (all other drug classes)    Go Sex-Intimacy & Heart Disease, Get SMART - Goal Setting: - Group verbal and written instruction through game format to discuss heart disease and the return to sexual intimacy. Provides group verbal and written material to discuss and apply goal setting through the application of the S.M.A.R.T. Method.   Other Matters of the Heart: - Provides group verbal, written materials and models to describe Stable Angina and Peripheral Artery. Includes description of the disease process and treatment options available to the cardiac patient.   Exercise & Equipment Safety: - Individual verbal instruction and demonstration of equipment use and safety with use of the equipment.   Cardiac Rehab from 01/29/2019 in [facility] Cardiac and Pulmonary Rehab  Date   01/29/19  Educator  [facility]  Instruction Review Code  1- Verbalizes Understanding      Infection Prevention: - Provides verbal and written material to individual with discussion of infection control including proper hand washing and proper equipment cleaning during exercise session.   Cardiac Rehab from 01/29/2019 in [facility] Cardiac and Pulmonary Rehab  Date  01/29/19  Educator  [facility]  Instruction Review Code  1- Verbalizes Understanding      Falls Prevention: - Provides verbal and written material to individual with discussion of falls prevention and safety.   Cardiac Rehab from 01/29/2019 in [facility] Cardiac and Pulmonary Rehab  Date  01/29/19  Educator  [facility]  Instruction Review Code  1- Verbalizes Understanding      Diabetes: - Individual verbal and written instruction to review signs/symptoms of diabetes, desired ranges of glucose level fasting, after meals and with exercise. Acknowledge that pre and post exercise glucose checks will be done for 3 sessions at entry of program.   Cardiac Rehab from 01/29/2019  in [facility]  Date  01/26/19  Educator  [facility]  Instruction Review Code  1- Verbalizes Understanding      Know Your Numbers and Risk Factors: -Group verbal and written instruction about important numbers in your health.  Discussion of what are risk factors and how they play a role in the disease process.  Review of Cholesterol, Blood Pressure, Diabetes, and BMI and the role they play in your overall health.   Sleep Hygiene: -Provides group verbal and written instruction about how sleep can affect your health.  Define sleep hygiene, discuss sleep cycles and impact of sleep habits. Review good sleep hygiene tips.    Other: -Provides group and verbal instruction on various topics (see comments)   Knowledge Questionnaire Score: Knowledge Questionnaire Score - 01/29/19 1338      Knowledge Questionnaire Score   Pre Score  18/23   Education Focus: MI, A&P, Diabetes,  PAD, Nutrition, Exercise, Cessation      Core Components/Risk Factors/Patient Goals at Admission: Personal Goals and Risk Factors at Admission - 01/29/19 1341      Core Components/Risk Factors/Patient Goals on Admission    Weight Management  Yes;Obesity;Weight Loss    Intervention  Weight Management: Develop a combined nutrition and exercise program designed to reach desired caloric intake, while maintaining appropriate intake of nutrient and fiber, sodium and fats, and appropriate energy expenditure required for the weight goal.;Weight Management: Provide education and appropriate resources to help participant work on and attain dietary goals.;Weight Management/Obesity: Establish reasonable short term and long term weight goals.;Obesity: Provide education and appropriate resources to help participant work on and attain dietary goals.    Admit Weight  287 lb 12.8 oz (130.5 kg)    Goal Weight: Short Term  282 lb (127.9 kg)    Goal Weight: Long Term  270 lb (122.5 kg)    Expected Outcomes  Short Term: Continue to assess and modify interventions until short term weight is achieved;Long Term: Adherence to nutrition and physical activity/exercise program aimed toward attainment of established weight goal;Weight Loss: Understanding of general recommendations for a balanced deficit meal plan, which promotes 1-2 lb weight loss per week and includes a negative energy balance of [phone] kcal/d;Understanding recommendations for meals to include 15-35% energy as protein, 25-35% energy from fat, 35-60% energy from carbohydrates, less than '200mg'$  of dietary cholesterol, 20-35 gm of total fiber daily;Understanding of distribution of calorie intake throughout the day with the consumption of 4-5 meals/snacks    Diabetes  Yes    Intervention  Provide education about signs/symptoms and action to take for hypo/hyperglycemia.;Provide education about proper nutrition, including hydration, and aerobic/resistive exercise  prescription along with prescribed medications to achieve blood glucose in normal ranges: Fasting glucose 65-99 mg/dL    Expected Outcomes  Short Term: Participant verbalizes understanding of the signs/symptoms and immediate care of hyper/hypoglycemia, proper foot care and importance of medication, aerobic/resistive exercise and nutrition plan for blood glucose control.;Long Term: Attainment of HbA1C < 7%.    Heart Failure  Yes    Intervention  Provide a combined exercise and nutrition program that is supplemented with education, support and counseling about heart failure. Directed toward relieving symptoms such as shortness of breath, decreased exercise tolerance, and extremity edema.    Expected Outcomes  Improve functional capacity of life;Short term: Attendance in program 2-3 days a week with increased exercise capacity. Reported lower sodium intake. Reported increased fruit and vegetable intake. Reports medication compliance.;Short term: Daily weights obtained and reported  for increase. Utilizing diuretic protocols set by physician.;Long term: Adoption of self-care skills and reduction of barriers for early signs and symptoms recognition and intervention leading to self-care maintenance.    Hypertension  Yes    Intervention  Provide education on lifestyle modifcations including regular physical activity/exercise, weight management, moderate sodium restriction and increased consumption of fresh fruit, vegetables, and low fat dairy, alcohol moderation, and smoking cessation.;Monitor prescription use compliance.    Expected Outcomes  Short Term: Continued assessment and intervention until BP is < 140/43m HG in hypertensive participants. < 130/865mHG in hypertensive participants with diabetes, heart failure or chronic kidney disease.;Long Term: Maintenance of blood pressure at goal levels.    Lipids  Yes    Intervention  Provide education and support for participant on nutrition & aerobic/resistive  exercise along with prescribed medications to achieve LDL '70mg'$ , HDL >'40mg'$ .    Expected Outcomes  Short Term: Participant states understanding of desired cholesterol values and is compliant with medications prescribed. Participant is following exercise prescription and nutrition guidelines.;Long Term: Cholesterol controlled with medications as prescribed, with individualized exercise RX and with personalized nutrition plan. Value goals: LDL < '70mg'$ , HDL > 40 mg.       Core Components/Risk Factors/Patient Goals Review:    Core Components/Risk Factors/Patient Goals at Discharge (Final Review):    ITP Comments: ITP Comments    Row Name 01/26/19 1421 01/29/19 1328 02/02/19 1136 02/06/19 1122 02/18/19 0551   ITP Comments  Virtual Orientation completed. [facility] Scheduled for EP/RD orientation 7/23 at 11  6MWT and orientation completed.  Initial ITP created and sent to Dr. BeRamonita Labovering for Medical Director Dr. MaEmily Filbert First full day of exercise!  Patient was oriented to gym and equipment including functions, settings, policies, and procedures.  Patient's individual exercise prescription and treatment plan were reviewed.  All starting workloads were established based on the results of the 6 minute walk test done at initial orientation visit.  The plan for exercise progression was also introduced and progression will be customized based on patient's performance and goals.  Returned [employer] [employer] He slipped in the driveway this morning and hurt his foot.  I encouraged him to seek care if swelling or pain increase or continue.  He hopes to return on Monday.  30 Day Review Completed today. Continue with ITP unless changed by Medical Director review.   RoFlorham Parkame 02/18/19 [id number] 03/18/19 0552 03/31/19 1541 04/15/19 1338   ITP Comments  Derrick Barajas Mornalled to let usKoreanow he will not be in class until he can get his car fixed.  GaMontrailays he cannot use ACTA since he has a  car.  I advised him to call and see if he can use ACTA since his car is not working.  Unable to complete nutrition 30-day re-eval cycle due to inconsistent attendence  30 Day review. Continue with ITP unless directed changes per Medical Director review. Out since early Aug Transportation problems  Continues to have issues with transportation.  Department is learning about new options.  Still out with transportation issues.  30 day review completed. ITP sent to Dr. MaEmily FilbertMedical Director of Cardiac and Pulmonary Rehab. Continue with ITP unless changes are made by physician.  Department closed starting 10/2 until further notice by infection prevention and Health at Work teams for [facility]  RoOswegoame 04/21/19 1009 05/13/19 0616         ITP Comments  Continues to be out due to transportation issues.  30 day review completed. Continue with ITP sent to Dr. Emily Filbert, Medical Director of Cardiac and Pulmonary Rehab for review , changes as needed and signature.  Remains out with transportation issues         Comments:

## 2019-06-08 ENCOUNTER — Encounter: Payer: Self-pay | Admitting: Podiatry

## 2019-06-08 ENCOUNTER — Ambulatory Visit: Payer: Medicaid Other | Admitting: Podiatry

## 2019-06-08 ENCOUNTER — Other Ambulatory Visit: Payer: Self-pay

## 2019-06-08 DIAGNOSIS — E0842 Diabetes mellitus due to underlying condition with diabetic polyneuropathy: Secondary | ICD-10-CM | POA: Diagnosis not present

## 2019-06-08 DIAGNOSIS — B351 Tinea unguium: Secondary | ICD-10-CM

## 2019-06-08 DIAGNOSIS — M79676 Pain in unspecified toe(s): Secondary | ICD-10-CM | POA: Diagnosis not present

## 2019-06-08 NOTE — Progress Notes (Signed)
Complaint:  Visit Type: Patient returns to my office for continued preventative foot care services. Complaint: Patient states" my nails have grown long and thick and become painful to walk and wear shoes" Patient has been diagnosed with DM with no foot complications. The patient presents for preventative foot care services. No changes to ROS  Podiatric Exam: Vascular: dorsalis pedis and posterior tibial pulses are palpable bilateral. Capillary return is immediate. Temperature gradient is WNL. Skin turgor WNL  Sensorium: Normal Semmes Weinstein monofilament test. Normal tactile sensation bilaterally. Nail Exam: Pt has thick disfigured discolored nails with subungual debris noted bilateral entire nail hallux through fifth toenails Ulcer Exam: There is no evidence of ulcer or pre-ulcerative changes or infection. Orthopedic Exam: Muscle tone and strength are WNL. No limitations in general ROM. No crepitus or effusions noted. Foot type and digits show no abnormalities. Bony prominences are unremarkable. Skin: No Porokeratosis. No infection or ulcers.  Dry scaly skin noted.  Diagnosis:  Onychomycosis, , Pain in right toe, pain in left toes  Treatment & Plan Procedures and Treatment: Consent by patient was obtained for treatment procedures. The patient understood the discussion of treatment and procedures well. All questions were answered thoroughly reviewed. Debridement of mycotic and hypertrophic toenails, 1 through 5 bilateral and clearing of subungual debris. No ulceration, no infection noted. Told him to try vaseline for his skin. Return Visit-Office Procedure: Patient instructed to return to the office for a follow up visit 3 months for continued evaluation and treatment.    Gardiner Barefoot DPM

## 2019-06-10 ENCOUNTER — Encounter: Payer: Self-pay | Admitting: *Deleted

## 2019-06-10 DIAGNOSIS — Z955 Presence of coronary angioplasty implant and graft: Secondary | ICD-10-CM

## 2019-06-10 NOTE — Progress Notes (Signed)
Cardiac Individual Treatment Plan  Patient Details  Name: Derrick Barajas 2MRN: [id number] Date of Birth: 1956-03-03 Referring Provider:     Cardiac Rehab from 01/29/2019 in [facility] Cardiac and Pulmonary Rehab  Referring Provider  Neoma Laming MD      Initial Encounter Date:    Cardiac Rehab from 01/29/2019 in [facility] Cardiac and Pulmonary Rehab  Date  01/29/19      Visit Diagnosis: Status post coronary artery stent placement  Patient's Home Medications on Admission:  Current Outpatient Medications:  .  ACCU-CHEK AVIVA PLUS test strip, TEST TID, Disp: , Rfl: 11 .  amLODipine (NORVASC) 10 MG tablet, Take 10 mg by mouth daily. , Disp: , Rfl:  .  aspirin EC 81 MG tablet, Take 81 mg by mouth daily., Disp: , Rfl:  .  B-D ULTRAFINE III SHORT PEN 31G X 8 MM MISC, USE UTD BID, Disp: , Rfl: 3 .  docusate sodium (COLACE) 100 MG capsule, Take 100 mg at bedtime by mouth. , Disp: , Rfl:  .  ezetimibe (ZETIA) 10 MG tablet, Take 10 mg by mouth 2 (two) times daily., Disp: , Rfl:  .  fluticasone (FLONASE) 50 MCG/ACT nasal spray, Place 1 spray into both nostrils daily. , Disp: , Rfl: 1 .  furosemide (LASIX) 20 MG tablet, Take 20 mg by mouth 2 (two) times daily. , Disp: , Rfl:  .  Insulin Degludec (TRESIBA FLEXTOUCH) 200 UNIT/ML SOPN, Inject 140 Units into the skin daily. , Disp: , Rfl:  .  INVOKANA 300 MG TABS tablet, Take 300 mg by mouth daily. , Disp: , Rfl: 0 .  isosorbide mononitrate (IMDUR) 60 MG 24 hr tablet, Take 60 mg daily by mouth., Disp: , Rfl:  .  levothyroxine (SYNTHROID, LEVOTHROID) 125 MCG tablet, Take 125 mcg by mouth daily before breakfast. , Disp: , Rfl: 2 .  metFORMIN (GLUCOPHAGE-XR) 500 MG 24 hr tablet, , Disp: , Rfl:  .  metoprolol succinate (TOPROL-XL) 100 MG 24 hr tablet, Take 100 mg daily by mouth. Take with or immediately following a meal., Disp: , Rfl:  .  mupirocin ointment (BACTROBAN) 2 %, Apply to affected area 3 times daily until healed., Disp: 22 g, Rfl: 0 .  olopatadine  (PATANOL) 0.1 % ophthalmic solution, Place 1 drop into both eyes daily., Disp: , Rfl:  .  prasugrel (EFFIENT) 10 MG TABS, Take 10 mg by mouth daily at 3 pm. , Disp: , Rfl:  .  ramipril (ALTACE) 10 MG capsule, Take 10 mg by mouth daily. , Disp: , Rfl: 2 .  ranolazine (RANEXA) 500 MG 12 hr tablet, Take 500 mg by mouth 2 (two) times daily., Disp: , Rfl:  .  rosuvastatin (CRESTOR) 20 MG tablet, TK 1 T PO QD, Disp: , Rfl:  .  triamcinolone ointment (KENALOG) 0.5 %, Apply 1 application topically 2 (two) times daily., Disp: 30 g, Rfl: 0 .  TRULICITY 1.5 IR/6.7EL SOPN, Inject 1.5 mg into the skin every Wednesday. , Disp: , Rfl: 2  Past Medical History: Past Medical History:  Diagnosis Date  . CAD (coronary artery disease)   . CHF (congestive heart failure) ([place])   . Diabetes mellitus without complication ([place])   . Gout   . Hyperlipidemia   . Hypertension   . MI (myocardial infarction) ([place])   . RA (rheumatoid arthritis) ([place])   . Sleep apnea   . 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 Thyroid disease     Tobacco Use:  Social History   Tobacco Use  Smoking Status Never Smoker  Smokeless Tobacco Never Used    Labs: Recent Review Flowsheet Data    Labs for ITP Cardiac and Pulmonary Rehab Latest Ref Rng & Units 08/29/2011 02/05/2017 11/18/2018   Cholestrol 0 - 200 mg/dL 139 207(H) 164   LDLCALC 0 - 99 mg/dL 64 99 82   HDL >40 mg/dL 38(L) 35(L) 63   Trlycerides <150 mg/dL 185 367(H) 97   Hemoglobin A1c 4.8 - 5.6 % 10.0(H) 7.5(H) -       Exercise Target Goals: Exercise Program Goal: Individual exercise prescription set using results from initial 6 min walk test and THRR while considering  patient's activity barriers and safety.   Exercise Prescription Goal: Initial exercise prescription builds to 30-45 minutes a day of aerobic activity, 2-3 days per week.  Home exercise guidelines will be given to patient during program as part of exercise prescription that the participant will  acknowledge.  Activity Barriers & Risk Stratification: Activity Barriers & Cardiac Risk Stratification - 01/29/19 1330      Activity Barriers & Cardiac Risk Stratification   Activity Barriers  Joint Problems;Shortness of Breath;Muscular Weakness;Deconditioning;Balance Concerns;Other (comment)    Comments  R rotator cuff tear, occasional knee pain    Cardiac Risk Stratification  High       6 Minute Walk: 6 Minute Walk    Row Name 01/29/19 1329         6 Minute Walk   Phase  Initial     Distance  1100 feet     Walk Time  6 minutes     # of Rest Breaks  0     MPH  2.08     METS  2.26     RPE  14     Perceived Dyspnea   2     VO2 Peak  7.92     Symptoms  Yes (comment)     Comments  SOB     Resting HR  74 bpm     Resting BP  134/74     Resting Oxygen Saturation   98 %     Exercise Oxygen Saturation  during 6 min walk  94 %     Max Ex. HR  107 bpm     Max Ex. BP  146/64     2 Minute Post BP  126/64        Oxygen Initial Assessment:   Oxygen Re-Evaluation:   Oxygen Discharge (Final Oxygen Re-Evaluation):   Initial Exercise Prescription: Initial Exercise Prescription - 01/29/19 1300      Date of Initial Exercise RX and Referring Provider   Date  01/29/19    Referring Provider  Neoma Laming MD      Treadmill   MPH  1.6    Grade  0    Minutes  15    METs  2.23      Recumbant Bike   Level  1    RPM  50    Watts  10    Minutes  15    METs  2.2      NuStep   Level  1    SPM  80    Minutes  15    METs  2      Biostep-RELP   Level  1    SPM  50    Minutes  15    METs  2      Prescription Details  Frequency (times per week)  3    Duration  Progress to 30 minutes of continuous aerobic without signs/symptoms of physical distress      Intensity   THRR 40-80% of Max Heartrate  107-140    Ratings of Perceived Exertion  11-13    Perceived Dyspnea  0-4      Progression   Progression  Continue to progress workloads to maintain intensity without  signs/symptoms of physical distress.      Resistance Training   Training Prescription  Yes    Weight  3 lbs    Reps  10-15       Perform Capillary Blood Glucose checks as needed.  Exercise Prescription Changes: Exercise Prescription Changes    Row Name 01/29/19 1300 02/09/19 0900           Response to Exercise   Blood Pressure (Admit)  134/74  148/82      Blood Pressure (Exercise)  146/64  136/74      Blood Pressure (Exit)  126/64  150/92      Heart Rate (Admit)  74 bpm  52 bpm      Heart Rate (Exercise)  107 bpm  105 bpm      Heart Rate (Exit)  75 bpm  96 bpm      Oxygen Saturation (Admit)  98 %  -      Oxygen Saturation (Exercise)  94 %  -      Rating of Perceived Exertion (Exercise)  14  13      Perceived Dyspnea (Exercise)  2  -      Symptoms  SOB  none      Comments  walk test results  -      Duration  -  Progress to 30 minutes of  aerobic without signs/symptoms of physical distress      Intensity  -  THRR unchanged        Progression   Progression  -  Continue to progress workloads to maintain intensity without signs/symptoms of physical distress.      Average METs  -  2.41        Resistance Training   Training Prescription  -  Yes      Weight  -  3 lbs      Reps  -  10-15        Interval Training   Interval Training  -  No        Treadmill   MPH  -  1.6      Grade  -  0      Minutes  -  15      METs  -  2.23        Recumbant Bike   Level  -  3      Watts  -  26      Minutes  -  15      METs  -  2.61        NuStep   Level  -  1      Minutes  -  15      METs  -  1.8        Biostep-RELP   Level  -  3      Minutes  -  15      METs  -  3         Exercise Comments: Exercise Comments    Row Name 02/27/19 [phone]  Exercise Comments  Maston has not attended since 8/7.          Exercise Goals and Review: Exercise Goals    Row Name 01/29/19 1335             Exercise Goals   Increase Physical Activity  Yes       Intervention   Provide advice, education, support and counseling about physical activity/exercise needs.;Develop an individualized exercise prescription for aerobic and resistive training based on initial evaluation findings, risk stratification, comorbidities and participant's personal goals.       Expected Outcomes  Short Term: Attend rehab on a regular basis to increase amount of physical activity.;Long Term: Add in home exercise to make exercise part of routine and to increase amount of physical activity.;Long Term: Exercising regularly at least 3-5 days a week.       Increase Strength and Stamina  Yes       Intervention  Provide advice, education, support and counseling about physical activity/exercise needs.;Develop an individualized exercise prescription for aerobic and resistive training based on initial evaluation findings, risk stratification, comorbidities and participant's personal goals.       Expected Outcomes  Short Term: Increase workloads from initial exercise prescription for resistance, speed, and METs.;Short Term: Perform resistance training exercises routinely during rehab and add in resistance training at home;Long Term: Improve cardiorespiratory fitness, muscular endurance and strength as measured by increased METs and functional capacity (6MWT)       Able to understand and use rate of perceived exertion (RPE) scale  Yes       Intervention  Provide education and explanation on how to use RPE scale       Expected Outcomes  Short Term: Able to use RPE daily in rehab to express subjective intensity level;Long Term:  Able to use RPE to guide intensity level when exercising independently       Able to understand and use Dyspnea scale  Yes       Intervention  Provide education and explanation on how to use Dyspnea scale       Expected Outcomes  Short Term: Able to use Dyspnea scale daily in rehab to express subjective sense of shortness of breath during exertion;Long Term: Able to use Dyspnea scale to  guide intensity level when exercising independently       Knowledge and understanding of Target Heart Rate Range (THRR)  Yes       Intervention  Provide education and explanation of THRR including how the numbers were predicted and where they are located for reference       Expected Outcomes  Short Term: Able to state/look up THRR;Short Term: Able to use daily as guideline for intensity in rehab;Long Term: Able to use THRR to govern intensity when exercising independently       Able to check pulse independently  Yes       Intervention  Provide education and demonstration on how to check pulse in carotid and radial arteries.;Review the importance of being able to check your own pulse for safety during independent exercise       Expected Outcomes  Short Term: Able to explain why pulse checking is important during independent exercise;Long Term: Able to check pulse independently and accurately       Understanding of Exercise Prescription  Yes       Intervention  Provide education, explanation, and written materials on patient's individual exercise prescription       Expected Outcomes  Short Term:  Able to explain program exercise prescription;Long Term: Able to explain home exercise prescription to exercise independently          Exercise Goals Re-Evaluation : Exercise Goals Re-Evaluation    Row Name 02/02/19 1136 02/09/19 0924 03/31/19 1542 04/21/19 1009       Exercise Goal Re-Evaluation   Exercise Goals Review  Increase Physical Activity;Able to understand and use rate of perceived exertion (RPE) scale;Knowledge and understanding of Target Heart Rate Range (THRR);Understanding of Exercise Prescription;Increase Strength and Stamina;Able to check pulse independently  Increase Physical Activity;Increase Strength and Stamina;Understanding of Exercise Prescription  -  -    Comments  Reviewed RPE scale, THR and program prescription with pt today.  Pt voiced understanding and was given a copy of goals to  take home.  Malick has completed two full days of exercise.  He hurt his foot on Friday and was not able to come in.  He is off to a good start.  We will continue to monitor his progress.  Out since last review  Out since last review    Expected Outcomes  Short: Use RPE daily to regulate intensity. Long: Follow program prescription in THR.  Short: Continue to attend rehab regularly.  Long: Continue to follow program prescription.  -  -       Discharge Exercise Prescription (Final Exercise Prescription Changes): Exercise Prescription Changes - 02/09/19 0900      Response to Exercise   Blood Pressure (Admit)  148/82    Blood Pressure (Exercise)  136/74    Blood Pressure (Exit)  150/92    Heart Rate (Admit)  52 bpm    Heart Rate (Exercise)  105 bpm    Heart Rate (Exit)  96 bpm    Rating of Perceived Exertion (Exercise)  13    Symptoms  none    Duration  Progress to 30 minutes of  aerobic without signs/symptoms of physical distress    Intensity  THRR unchanged      Progression   Progression  Continue to progress workloads to maintain intensity without signs/symptoms of physical distress.    Average METs  2.41      Resistance Training   Training Prescription  Yes    Weight  3 lbs    Reps  10-15      Interval Training   Interval Training  No      Treadmill   MPH  1.6    Grade  0    Minutes  15    METs  2.23      Recumbant Bike   Level  3    Watts  26    Minutes  15    METs  2.61      NuStep   Level  1    Minutes  15    METs  1.8      Biostep-RELP   Level  3    Minutes  15    METs  3       Nutrition:  Target Goals: Understanding of nutrition guidelines, daily intake of sodium <1548m, cholesterol <2032m calories 30% from fat and 7% or less from saturated fats, daily to have 5 or more servings of fruits and vegetables.  Biometrics: Pre Biometrics - 01/29/19 1337      Pre Biometrics   Height  5' 9.5" (1.765 m)    Weight  287 lb 12.8 oz (130.5 kg)    BMI  (Calculated)  41.91    Single Leg  Stand  2 seconds        Nutrition Therapy Plan and Nutrition Goals: Nutrition Therapy & Goals - 01/29/19 1223      Nutrition Therapy   Diet  Low Na, HH, diabetic diet    Protein (specify units)  100g    Fiber  25 grams    Whole Grain Foods  3 servings    Saturated Fats  12 max. grams    Fruits and Vegetables  5 servings/day    Sodium  1.5 grams      Personal Nutrition Goals   Nutrition Goal  ST: continue with HH eating and label reading LT: lose weight (unspecified goal weight)    Comments  Pt reports normally eating [place] bacon with egg whites (sometimes whole eggs) with some cheese or oatmeal in the am. fried fish or baked chicken witht the skin, grain or squash, and collards, lettuce, or other greens (similar dinner). Pt will eat snowqueen diet ice cream most nights, every friday will have popcorn and snacks. once a month will have lite beer. Meals on wheels will bring him frozen regular and diet meals for the week (replaces L or D). Uses low fat dairy (aside from cheese) and will try to get olive oil or spreads made with olive oil. Disucssed HH DM diet. discussed the importance of BG control, monitoring, frequent meals, composition of meals (pt reports choosing whoel grains), discussed what to do if BG gets too low. Pt reports wanting to lose weight for his heart, discussed healthy weight loss and how healthy habits and lifestyle is more important. Pt did not want to make any changes. (discussed maybe adding protein extenders (beans) and additional veggies to frozen meals to bulk it up - can also help manage sodium).      Intervention Plan   Intervention  Prescribe, educate and counsel regarding individualized specific dietary modifications aiming towards targeted core components such as weight, hypertension, lipid management, diabetes, heart failure and other comorbidities.;Nutrition handout(s) given to patient.    Expected Outcomes  Short Term Goal:  Understand basic principles of dietary content, such as calories, fat, sodium, cholesterol and nutrients.;Short Term Goal: A plan has been developed with personal nutrition goals set during dietitian appointment.;Long Term Goal: Adherence to prescribed nutrition plan.       Nutrition Assessments: Nutrition Assessments - 01/29/19 1243      MEDFICTS Scores   Pre Score  51       Nutrition Goals Re-Evaluation:   Nutrition Goals Discharge (Final Nutrition Goals Re-Evaluation):   Psychosocial: Target Goals: Acknowledge presence or absence of significant depression and/or stress, maximize coping skills, provide positive support system. Participant is able to verbalize types and ability to use techniques and skills needed for reducing stress and depression.   Initial Review & Psychosocial Screening: Initial Psych Review & Screening - 01/26/19 1422      Initial Review   Current issues with  Current Stress Concerns    Comments  Perley reported sleeping well, having a good support system, but very stressed about COVID. He is worried about himself and his family/friends. He is getting his meals and is financially okay for now.      Family Dynamics   Good Support System?  Yes      Barriers   Psychosocial barriers to participate in program  There are no identifiable barriers or psychosocial needs.      Screening Interventions   Interventions  Encouraged to exercise    Expected Outcomes  Short Term goal: Utilizing psychosocial counselor, staff and physician to assist with identification of specific Stressors or current issues interfering with healing process. Setting desired goal for each stressor or current issue identified.;Long Term Goal: Stressors or current issues are controlled or eliminated.;Short Term goal: Identification and review with participant of any Quality of Life or Depression concerns found by scoring the questionnaire.;Long Term goal: The participant improves quality of Life  and PHQ9 Scores as seen by post scores and/or verbalization of changes       Quality of Life Scores:  Quality of Life - 01/29/19 1338      Quality of Life   Select  Quality of Life      Quality of Life Scores   Health/Function Pre  27.2 %    Socioeconomic Pre  28.29 %    Psych/Spiritual Pre  28.29 %    Family Pre  30 %    GLOBAL Pre  27.94 %      Scores of 19 and below usually indicate a poorer quality of life in these areas.  A difference of  2-3 points is a clinically meaningful difference.  A difference of 2-3 points in the total score of the Quality of Life Index has been associated with significant improvement in overall quality of life, self-image, physical symptoms, and general health in studies assessing change in quality of life.  PHQ-9: Recent Review Flowsheet Data    Depression screen [facility] 2/9 10/05/2015   Decreased Interest 1   Down, Depressed, Hopeless 0   PHQ - 2 Score 1     Interpretation of Total Score  Total Score Depression Severity:  1-4 = Minimal depression, 5-9 = Mild depression, 10-14 = Moderate depression, 15-19 = Moderately severe depression, 20-27 = Severe depression   Psychosocial Evaluation and Intervention:   Psychosocial Re-Evaluation:   Psychosocial Discharge (Final Psychosocial Re-Evaluation):   Vocational Rehabilitation: Provide vocational rehab assistance to qualifying candidates.   Vocational Rehab Evaluation & Intervention: Vocational Rehab - 01/26/19 1422      Initial Vocational Rehab Evaluation & Intervention   Assessment shows need for Vocational Rehabilitation  No       Education: Education Goals: Education classes will be provided on a variety of topics geared toward better understanding of heart health and risk factor modification. Participant will state understanding/return demonstration of topics presented as noted by education test scores.  Learning Barriers/Preferences: Learning Barriers/Preferences - 01/26/19 1422       Learning Barriers/Preferences   Learning Barriers  None    Learning Preferences  Individual Instruction       Education Topics:  AED/CPR: - Group verbal and written instruction with the use of models to demonstrate the basic use of the AED with the basic ABC's of resuscitation.   General Nutrition Guidelines/Fats and Fiber: -Group instruction provided by verbal, written material, models and posters to present the general guidelines for heart healthy nutrition. Gives an explanation and review of dietary fats and fiber.   Controlling Sodium/Reading Food Labels: -Group verbal and written material supporting the discussion of sodium use in heart healthy nutrition. Review and explanation with models, verbal and written materials for utilization of the food label.   Exercise Physiology & General Exercise Guidelines: - Group verbal and written instruction with models to review the exercise physiology of the cardiovascular system and associated critical values. Provides general exercise guidelines with specific guidelines to those with heart or lung disease.    Aerobic Exercise & Resistance Training: - Gives group  verbal and written instruction on the various components of exercise. Focuses on aerobic and resistive training programs and the benefits of this training and how to safely progress through these programs..   Flexibility, Balance, Mind/Body Relaxation: Provides group verbal/written instruction on the benefits of flexibility and balance training, including mind/body exercise modes such as yoga, pilates and tai chi.  Demonstration and skill practice provided.   Stress and Anxiety: - Provides group verbal and written instruction about the health risks of elevated stress and causes of high stress.  Discuss the correlation between heart/lung disease and anxiety and treatment options. Review healthy ways to manage with stress and anxiety.   Depression: - Provides group verbal and  written instruction on the correlation between heart/lung disease and depressed mood, treatment options, and the stigmas associated with seeking treatment.   Anatomy & Physiology of the Heart: - Group verbal and written instruction and models provide basic cardiac anatomy and physiology, with the coronary electrical and arterial systems. Review of Valvular disease and Heart Failure   Cardiac Procedures: - Group verbal and written instruction to review commonly prescribed medications for heart disease. Reviews the medication, class of the drug, and side effects. Includes the steps to properly store meds and maintain the prescription regimen. (beta blockers and nitrates)   Cardiac Medications I: - Group verbal and written instruction to review commonly prescribed medications for heart disease. Reviews the medication, class of the drug, and side effects. Includes the steps to properly store meds and maintain the prescription regimen.   Cardiac Medications II: -Group verbal and written instruction to review commonly prescribed medications for heart disease. Reviews the medication, class of the drug, and side effects. (all other drug classes)    Go Sex-Intimacy & Heart Disease, Get SMART - Goal Setting: - Group verbal and written instruction through game format to discuss heart disease and the return to sexual intimacy. Provides group verbal and written material to discuss and apply goal setting through the application of the S.M.A.R.T. Method.   Other Matters of the Heart: - Provides group verbal, written materials and models to describe Stable Angina and Peripheral Artery. Includes description of the disease process and treatment options available to the cardiac patient.   Exercise & Equipment Safety: - Individual verbal instruction and demonstration of equipment use and safety with use of the equipment.   Cardiac Rehab from 01/29/2019 in [facility] Cardiac and Pulmonary Rehab  Date  01/29/19   Educator  [facility]  Instruction Review Code  1- Verbalizes Understanding      Infection Prevention: - Provides verbal and written material to individual with discussion of infection control including proper hand washing and proper equipment cleaning during exercise session.   Cardiac Rehab from 01/29/2019 in [facility] Cardiac and Pulmonary Rehab  Date  01/29/19  Educator  [facility]  Instruction Review Code  1- Verbalizes Understanding      Falls Prevention: - Provides verbal and written material to individual with discussion of falls prevention and safety.   Cardiac Rehab from 01/29/2019 in [facility] Cardiac and Pulmonary Rehab  Date  01/29/19  Educator  [facility]  Instruction Review Code  1- Verbalizes Understanding      Diabetes: - Individual verbal and written instruction to review signs/symptoms of diabetes, desired ranges of glucose level fasting, after meals and with exercise. Acknowledge that pre and post exercise glucose checks will be done for 3 sessions at entry of program.   Cardiac Rehab from 01/29/2019 in [facility]  Date  01/26/19  Educator  [place]  Instruction Review Code  1- Verbalizes Understanding      Know Your Numbers and Risk Factors: -Group verbal and written instruction about important numbers in your health.  Discussion of what are risk factors and how they play a role in the disease process.  Review of Cholesterol, Blood Pressure, Diabetes, and BMI and the role they play in your overall health.   Sleep Hygiene: -Provides group verbal and written instruction about how sleep can affect your health.  Define sleep hygiene, discuss sleep cycles and impact of sleep habits. Review good sleep hygiene tips.    Other: -Provides group and verbal instruction on various topics (see comments)   Knowledge Questionnaire Score: Knowledge Questionnaire Score - 01/29/19 1338      Knowledge Questionnaire Score   Pre Score  18/23   Education Focus: MI, A&P, Diabetes, PAD,  Nutrition, Exercise, Cessation      Core Components/Risk Factors/Patient Goals at Admission: Personal Goals and Risk Factors at Admission - 01/29/19 1341      Core Components/Risk Factors/Patient Goals on Admission    Weight Management  Yes;Obesity;Weight Loss    Intervention  Weight Management: Develop a combined nutrition and exercise program designed to reach desired caloric intake, while maintaining appropriate intake of nutrient and fiber, sodium and fats, and appropriate energy expenditure required for the weight goal.;Weight Management: Provide education and appropriate resources to help participant work on and attain dietary goals.;Weight Management/Obesity: Establish reasonable short term and long term weight goals.;Obesity: Provide education and appropriate resources to help participant work on and attain dietary goals.    Admit Weight  287 lb 12.8 oz (130.5 kg)    Goal Weight: Short Term  282 lb (127.9 kg)    Goal Weight: Long Term  270 lb (122.5 kg)    Expected Outcomes  Short Term: Continue to assess and modify interventions until short term weight is achieved;Long Term: Adherence to nutrition and physical activity/exercise program aimed toward attainment of established weight goal;Weight Loss: Understanding of general recommendations for a balanced deficit meal plan, which promotes 1-2 lb weight loss per week and includes a negative energy balance of [phone] kcal/d;Understanding recommendations for meals to include 15-35% energy as protein, 25-35% energy from fat, 35-60% energy from carbohydrates, less than 242m of dietary cholesterol, 20-35 gm of total fiber daily;Understanding of distribution of calorie intake throughout the day with the consumption of 4-5 meals/snacks    Diabetes  Yes    Intervention  Provide education about signs/symptoms and action to take for hypo/hyperglycemia.;Provide education about proper nutrition, including hydration, and aerobic/resistive exercise  prescription along with prescribed medications to achieve blood glucose in normal ranges: Fasting glucose 65-99 mg/dL    Expected Outcomes  Short Term: Participant verbalizes understanding of the signs/symptoms and immediate care of hyper/hypoglycemia, proper foot care and importance of medication, aerobic/resistive exercise and nutrition plan for blood glucose control.;Long Term: Attainment of HbA1C < 7%.    Heart Failure  Yes    Intervention  Provide a combined exercise and nutrition program that is supplemented with education, support and counseling about heart failure. Directed toward relieving symptoms such as shortness of breath, decreased exercise tolerance, and extremity edema.    Expected Outcomes  Improve functional capacity of life;Short term: Attendance in program 2-3 days a week with increased exercise capacity. Reported lower sodium intake. Reported increased fruit and vegetable intake. Reports medication compliance.;Short term: Daily weights obtained and reported for increase. Utilizing diuretic protocols set by physician.;Long term:  Adoption of self-care skills and reduction of barriers for early signs and symptoms recognition and intervention leading to self-care maintenance.    Hypertension  Yes    Intervention  Provide education on lifestyle modifcations including regular physical activity/exercise, weight management, moderate sodium restriction and increased consumption of fresh fruit, vegetables, and low fat dairy, alcohol moderation, and smoking cessation.;Monitor prescription use compliance.    Expected Outcomes  Short Term: Continued assessment and intervention until BP is < 140/43m HG in hypertensive participants. < 130/872mHG in hypertensive participants with diabetes, heart failure or chronic kidney disease.;Long Term: Maintenance of blood pressure at goal levels.    Lipids  Yes    Intervention  Provide education and support for participant on nutrition & aerobic/resistive  exercise along with prescribed medications to achieve LDL <7058mHDL >2m73m  Expected Outcomes  Short Term: Participant states understanding of desired cholesterol values and is compliant with medications prescribed. Participant is following exercise prescription and nutrition guidelines.;Long Term: Cholesterol controlled with medications as prescribed, with individualized exercise RX and with personalized nutrition plan. Value goals: LDL < 70mg79mL > 40 mg.       Core Components/Risk Factors/Patient Goals Review:    Core Components/Risk Factors/Patient Goals at Discharge (Final Review):    ITP Comments: ITP Comments    Row Name 01/26/19 1421 01/29/19 1328 02/02/19 1136 02/06/19 1122 02/18/19 0551   ITP Comments  Virtual Orientation completed. Diagnosis can be found in [facility]. Scheduled for EP/RD orientation 7/23 at 11  6MWT and orientation completed.  Initial ITP created and sent to Dr. Bert Ramonita Labring for Medical Director Dr. Mark Emily Filbertrst full day of exercise!  Patient was oriented to gym and equipment including functions, settings, policies, and procedures.  Patient's individual exercise prescription and treatment plan were reviewed.  All starting workloads were established based on the results of the 6 minute walk test done at initial orientation visit.  The plan for exercise progression was also introduced and progression will be customized based on patient's performance and goals.  Returned [employer] slipped in the driveway this morning and hurt his foot.  I encouraged him to seek care if swelling or pain increase or continue.  He hopes to return on Monday.  30 Day Review Completed today. Continue with ITP unless changed by Medical Director review.   [place] 02/18/19 0953 [id number]/[id number] 03/18/19 0552 03/31/19 1541 04/15/19 1338   ITP Comments  GarryHoy Morned to let us kn[place] he will not be in class until he can get his car fixed.  [place] he cannot use [employer] since he has a  car.  I advised him to call and see if he can use [employer] since his car is not working.  Unable to complete nutrition 30-day re-eval cycle due to inconsistent attendence  30 Day review. Continue with ITP unless directed changes per Medical Director review. Out since early Aug Transportation problems  Continues to have issues with transportation.  Department is learning about new options.  Still out with transportation issues.  30 day review completed. ITP sent to Dr. Mark Emily Filbertical Director of Cardiac and Pulmonary Rehab. Continue with ITP unless changes are made by physician.  Department closed starting 10/2 until further notice by infection prevention and Health at Work teams for COVIDGil[place]ow [place] 04/21/19 1009 05/13/19 0616 06/10/19 0930       ITP Comments  Continues to be out due to transportation issues.  30 day review completed. Continue with ITP sent to Dr. Emily Filbert, Medical Director of Cardiac and Pulmonary Rehab for review , changes as needed and signature.  Remains out with transportation issues  30 day review competed . ITP sent to Dr Emily Filbert for review, changes as needed and ITP approval signature. Remains out        Comments:

## 2019-06-16 ENCOUNTER — Encounter: Payer: Self-pay | Admitting: *Deleted

## 2019-06-16 DIAGNOSIS — Z955 Presence of coronary angioplasty implant and graft: Secondary | ICD-10-CM

## 2019-06-16 NOTE — Progress Notes (Signed)
Cardiac Individual Treatment Plan  Patient Details  Name: Derrick Barajas MRN: [id number] Date of Birth: 1956-01-16 Referring Provider:     Cardiac Rehab from 01/29/2019 in [facility] Cardiac and Pulmonary Rehab  Referring Provider  Neoma Laming MD      Initial Encounter Date:    Cardiac Rehab from 01/29/2019 in [facility] Cardiac and Pulmonary Rehab  Date  01/29/19      Visit Diagnosis: Status post coronary artery stent placement  Patient's Home Medications on Admission:  Current Outpatient Medications:  .  ACCU-CHEK AVIVA PLUS test strip, TEST TID, Disp: , Rfl: 11 .  amLODipine (NORVASC) 10 MG tablet, Take 10 mg by mouth daily. , Disp: , Rfl:  .  aspirin EC 81 MG tablet, Take 81 mg by mouth daily., Disp: , Rfl:  .  B-D ULTRAFINE III SHORT PEN 31G X 8 MM MISC, USE UTD BID, Disp: , Rfl: 3 .  docusate sodium (COLACE) 100 MG capsule, Take 100 mg at bedtime by mouth. , Disp: , Rfl:  .  ezetimibe (ZETIA) 10 MG tablet, Take 10 mg by mouth 2 (two) times daily., Disp: , Rfl:  .  fluticasone (FLONASE) 50 MCG/ACT nasal spray, Place 1 spray into both nostrils daily. , Disp: , Rfl: 1 .  furosemide (LASIX) 20 MG tablet, Take 20 mg by mouth 2 (two) times daily. , Disp: , Rfl:  .  Insulin Degludec (TRESIBA FLEXTOUCH) 200 UNIT/ML SOPN, Inject 140 Units into the skin daily. , Disp: , Rfl:  .  INVOKANA 300 MG TABS tablet, Take 300 mg by mouth daily. , Disp: , Rfl: 0 .  isosorbide mononitrate (IMDUR) 60 MG 24 hr tablet, Take 60 mg daily by mouth., Disp: , Rfl:  .  levothyroxine (SYNTHROID, LEVOTHROID) 125 MCG tablet, Take 125 mcg by mouth daily before breakfast. , Disp: , Rfl: 2 .  metFORMIN (GLUCOPHAGE-XR) 500 MG 24 hr tablet, , Disp: , Rfl:  .  metoprolol succinate (TOPROL-XL) 100 MG 24 hr tablet, Take 100 mg daily by mouth. Take with or immediately following a meal., Disp: , Rfl:  .  mupirocin ointment (BACTROBAN) 2 %, Apply to affected area 3 times daily until healed., Disp: 22 g, Rfl: 0 .  olopatadine  (PATANOL) 0.1 % ophthalmic solution, Place 1 drop into both eyes daily., Disp: , Rfl:  .  prasugrel (EFFIENT) 10 MG TABS, Take 10 mg by mouth daily at 3 pm. , Disp: , Rfl:  .  ramipril (ALTACE) 10 MG capsule, Take 10 mg by mouth daily. , Disp: , Rfl: 2 .  ranolazine (RANEXA) 500 MG 12 hr tablet, Take 500 mg by mouth 2 (two) times daily., Disp: , Rfl:  .  rosuvastatin (CRESTOR) 20 MG tablet, TK 1 T PO QD, Disp: , Rfl:  .  triamcinolone ointment (KENALOG) 0.5 %, Apply 1 application topically 2 (two) times daily., Disp: 30 g, Rfl: 0 .  TRULICITY 1.5 KD/3.2IZ SOPN, Inject 1.5 mg into the skin every Wednesday. , Disp: , Rfl: 2  Past Medical History: Past Medical History:  Diagnosis Date  . CAD (coronary artery disease)   . CHF (congestive heart failure) ([place])   . Diabetes mellitus without complication ([place])   . Gout   . Hyperlipidemia   . Hypertension   . MI (myocardial infarction) ([place])   . RA (rheumatoid arthritis) ([place])   . Sleep apnea   . 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 Thyroid disease     Tobacco Use:  Social History   Tobacco Use  Smoking Status Never Smoker  Smokeless Tobacco Never Used    Labs: Recent Review Flowsheet Data    Labs for ITP Cardiac and Pulmonary Rehab Latest Ref Rng & Units 08/29/2011 02/05/2017 11/18/2018   Cholestrol 0 - 200 mg/dL 139 207(H) 164   LDLCALC 0 - 99 mg/dL 64 99 82   HDL >40 mg/dL 38(L) 35(L) 63   Trlycerides <150 mg/dL 185 367(H) 97   Hemoglobin A1c 4.8 - 5.6 % 10.0(H) 7.5(H) -       Exercise Target Goals: Exercise Program Goal: Individual exercise prescription set using results from initial 6 min walk test and THRR while considering  patient's activity barriers and safety.   Exercise Prescription Goal: Initial exercise prescription builds to 30-45 minutes a day of aerobic activity, 2-3 days per week.  Home exercise guidelines will be given to patient during program as part of exercise prescription that the participant will  acknowledge.  Activity Barriers & Risk Stratification: Activity Barriers & Cardiac Risk Stratification - 01/29/19 1330      Activity Barriers & Cardiac Risk Stratification   Activity Barriers  Joint Problems;Shortness of Breath;Muscular Weakness;Deconditioning;Balance Concerns;Other (comment)    Comments  R rotator cuff tear, occasional knee pain    Cardiac Risk Stratification  High       6 Minute Walk: 6 Minute Walk    Row Name 01/29/19 1329         6 Minute Walk   Phase  Initial     Distance  1100 feet     Walk Time  6 minutes     # of Rest Breaks  0     MPH  2.08     METS  2.26     RPE  14     Perceived Dyspnea   2     VO2 Peak  7.92     Symptoms  Yes (comment)     Comments  SOB     Resting HR  74 bpm     Resting BP  134/74     Resting Oxygen Saturation   98 %     Exercise Oxygen Saturation  during 6 min walk  94 %     Max Ex. HR  107 bpm     Max Ex. BP  146/64     2 Minute Post BP  126/64        Oxygen Initial Assessment:   Oxygen Re-Evaluation:   Oxygen Discharge (Final Oxygen Re-Evaluation):   Initial Exercise Prescription: Initial Exercise Prescription - 01/29/19 1300      Date of Initial Exercise RX and Referring Provider   Date  01/29/19    Referring Provider  Neoma Laming MD      Treadmill   MPH  1.6    Grade  0    Minutes  15    METs  2.23      Recumbant Bike   Level  1    RPM  50    Watts  10    Minutes  15    METs  2.2      NuStep   Level  1    SPM  80    Minutes  15    METs  2      Biostep-RELP   Level  1    SPM  50    Minutes  15    METs  2      Prescription Details  Frequency (times per week)  3    Duration  Progress to 30 minutes of continuous aerobic without signs/symptoms of physical distress      Intensity   THRR 40-80% of Max Heartrate  107-140    Ratings of Perceived Exertion  11-13    Perceived Dyspnea  0-4      Progression   Progression  Continue to progress workloads to maintain intensity without  signs/symptoms of physical distress.      Resistance Training   Training Prescription  Yes    Weight  3 lbs    Reps  10-15       Perform Capillary Blood Glucose checks as needed.  Exercise Prescription Changes: Exercise Prescription Changes    Row Name 01/29/19 1300 02/09/19 0900           Response to Exercise   Blood Pressure (Admit)  134/74  148/82      Blood Pressure (Exercise)  146/64  136/74      Blood Pressure (Exit)  126/64  150/92      Heart Rate (Admit)  74 bpm  52 bpm      Heart Rate (Exercise)  107 bpm  105 bpm      Heart Rate (Exit)  75 bpm  96 bpm      Oxygen Saturation (Admit)  98 %  -      Oxygen Saturation (Exercise)  94 %  -      Rating of Perceived Exertion (Exercise)  14  13      Perceived Dyspnea (Exercise)  2  -      Symptoms  SOB  none      Comments  walk test results  -      Duration  -  Progress to 30 minutes of  aerobic without signs/symptoms of physical distress      Intensity  -  THRR unchanged        Progression   Progression  -  Continue to progress workloads to maintain intensity without signs/symptoms of physical distress.      Average METs  -  2.41        Resistance Training   Training Prescription  -  Yes      Weight  -  3 lbs      Reps  -  10-15        Interval Training   Interval Training  -  No        Treadmill   MPH  -  1.6      Grade  -  0      Minutes  -  15      METs  -  2.23        Recumbant Bike   Level  -  3      Watts  -  26      Minutes  -  15      METs  -  2.61        NuStep   Level  -  1      Minutes  -  15      METs  -  1.8        Biostep-RELP   Level  -  3      Minutes  -  15      METs  -  3         Exercise Comments: Exercise Comments    Row Name 02/27/19 [phone]  Exercise Comments  Kashus has not attended since 8/7.          Exercise Goals and Review: Exercise Goals    Row Name 01/29/19 1335             Exercise Goals   Increase Physical Activity  Yes       Intervention   Provide advice, education, support and counseling about physical activity/exercise needs.;Develop an individualized exercise prescription for aerobic and resistive training based on initial evaluation findings, risk stratification, comorbidities and participant's personal goals.       Expected Outcomes  Short Term: Attend rehab on a regular basis to increase amount of physical activity.;Long Term: Add in home exercise to make exercise part of routine and to increase amount of physical activity.;Long Term: Exercising regularly at least 3-5 days a week.       Increase Strength and Stamina  Yes       Intervention  Provide advice, education, support and counseling about physical activity/exercise needs.;Develop an individualized exercise prescription for aerobic and resistive training based on initial evaluation findings, risk stratification, comorbidities and participant's personal goals.       Expected Outcomes  Short Term: Increase workloads from initial exercise prescription for resistance, speed, and METs.;Short Term: Perform resistance training exercises routinely during rehab and add in resistance training at home;Long Term: Improve cardiorespiratory fitness, muscular endurance and strength as measured by increased METs and functional capacity (6MWT)       Able to understand and use rate of perceived exertion (RPE) scale  Yes       Intervention  Provide education and explanation on how to use RPE scale       Expected Outcomes  Short Term: Able to use RPE daily in rehab to express subjective intensity level;Long Term:  Able to use RPE to guide intensity level when exercising independently       Able to understand and use Dyspnea scale  Yes       Intervention  Provide education and explanation on how to use Dyspnea scale       Expected Outcomes  Short Term: Able to use Dyspnea scale daily in rehab to express subjective sense of shortness of breath during exertion;Long Term: Able to use Dyspnea scale to  guide intensity level when exercising independently       Knowledge and understanding of Target Heart Rate Range (THRR)  Yes       Intervention  Provide education and explanation of THRR including how the numbers were predicted and where they are located for reference       Expected Outcomes  Short Term: Able to state/look up THRR;Short Term: Able to use daily as guideline for intensity in rehab;Long Term: Able to use THRR to govern intensity when exercising independently       Able to check pulse independently  Yes       Intervention  Provide education and demonstration on how to check pulse in carotid and radial arteries.;Review the importance of being able to check your own pulse for safety during independent exercise       Expected Outcomes  Short Term: Able to explain why pulse checking is important during independent exercise;Long Term: Able to check pulse independently and accurately       Understanding of Exercise Prescription  Yes       Intervention  Provide education, explanation, and written materials on patient's individual exercise prescription       Expected Outcomes  Short Term:  Able to explain program exercise prescription;Long Term: Able to explain home exercise prescription to exercise independently          Exercise Goals Re-Evaluation : Exercise Goals Re-Evaluation    Row Name 02/02/19 1136 02/09/19 0924 03/31/19 1542 04/21/19 1009       Exercise Goal Re-Evaluation   Exercise Goals Review  Increase Physical Activity;Able to understand and use rate of perceived exertion (RPE) scale;Knowledge and understanding of Target Heart Rate Range (THRR);Understanding of Exercise Prescription;Increase Strength and Stamina;Able to check pulse independently  Increase Physical Activity;Increase Strength and Stamina;Understanding of Exercise Prescription  -  -    Comments  Reviewed RPE scale, THR and program prescription with pt today.  Pt voiced understanding and was given a copy of goals to  take home.  Malick has completed two full days of exercise.  He hurt his foot on Friday and was not able to come in.  He is off to a good start.  We will continue to monitor his progress.  Out since last review  Out since last review    Expected Outcomes  Short: Use RPE daily to regulate intensity. Long: Follow program prescription in THR.  Short: Continue to attend rehab regularly.  Long: Continue to follow program prescription.  -  -       Discharge Exercise Prescription (Final Exercise Prescription Changes): Exercise Prescription Changes - 02/09/19 0900      Response to Exercise   Blood Pressure (Admit)  148/82    Blood Pressure (Exercise)  136/74    Blood Pressure (Exit)  150/92    Heart Rate (Admit)  52 bpm    Heart Rate (Exercise)  105 bpm    Heart Rate (Exit)  96 bpm    Rating of Perceived Exertion (Exercise)  13    Symptoms  none    Duration  Progress to 30 minutes of  aerobic without signs/symptoms of physical distress    Intensity  THRR unchanged      Progression   Progression  Continue to progress workloads to maintain intensity without signs/symptoms of physical distress.    Average METs  2.41      Resistance Training   Training Prescription  Yes    Weight  3 lbs    Reps  10-15      Interval Training   Interval Training  No      Treadmill   MPH  1.6    Grade  0    Minutes  15    METs  2.23      Recumbant Bike   Level  3    Watts  26    Minutes  15    METs  2.61      NuStep   Level  1    Minutes  15    METs  1.8      Biostep-RELP   Level  3    Minutes  15    METs  3       Nutrition:  Target Goals: Understanding of nutrition guidelines, daily intake of sodium <1548m, cholesterol <2032m calories 30% from fat and 7% or less from saturated fats, daily to have 5 or more servings of fruits and vegetables.  Biometrics: Pre Biometrics - 01/29/19 1337      Pre Biometrics   Height  5' 9.5" (1.765 m)    Weight  287 lb 12.8 oz (130.5 kg)    BMI  (Calculated)  41.91    Single Leg  Stand  2 seconds        Nutrition Therapy Plan and Nutrition Goals: Nutrition Therapy & Goals - 01/29/19 1223      Nutrition Therapy   Diet  Low Na, HH, diabetic diet    Protein (specify units)  100g    Fiber  25 grams    Whole Grain Foods  3 servings    Saturated Fats  12 max. grams    Fruits and Vegetables  5 servings/day    Sodium  1.5 grams      Personal Nutrition Goals   Nutrition Goal  ST: continue with HH eating and label reading LT: lose weight (unspecified goal weight)    Comments  Pt reports normally eating [place] bacon with egg whites (sometimes whole eggs) with some cheese or oatmeal in the am. fried fish or baked chicken witht the skin, grain or squash, and collards, lettuce, or other greens (similar dinner). Pt will eat snowqueen diet ice cream most nights, every friday will have popcorn and snacks. once a month will have lite beer. Meals on wheels will bring him frozen regular and diet meals for the week (replaces L or D). Uses low fat dairy (aside from cheese) and will try to get olive oil or spreads made with olive oil. Disucssed HH DM diet. discussed the importance of BG control, monitoring, frequent meals, composition of meals (pt reports choosing whoel grains), discussed what to do if BG gets too low. Pt reports wanting to lose weight for his heart, discussed healthy weight loss and how healthy habits and lifestyle is more important. Pt did not want to make any changes. (discussed maybe adding protein extenders (beans) and additional veggies to frozen meals to bulk it up - can also help manage sodium).      Intervention Plan   Intervention  Prescribe, educate and counsel regarding individualized specific dietary modifications aiming towards targeted core components such as weight, hypertension, lipid management, diabetes, heart failure and other comorbidities.;Nutrition handout(s) given to patient.    Expected Outcomes  Short Term Goal:  Understand basic principles of dietary content, such as calories, fat, sodium, cholesterol and nutrients.;Short Term Goal: A plan has been developed with personal nutrition goals set during dietitian appointment.;Long Term Goal: Adherence to prescribed nutrition plan.       Nutrition Assessments: Nutrition Assessments - 01/29/19 1243      MEDFICTS Scores   Pre Score  51       Nutrition Goals Re-Evaluation:   Nutrition Goals Discharge (Final Nutrition Goals Re-Evaluation):   Psychosocial: Target Goals: Acknowledge presence or absence of significant depression and/or stress, maximize coping skills, provide positive support system. Participant is able to verbalize types and ability to use techniques and skills needed for reducing stress and depression.   Initial Review & Psychosocial Screening: Initial Psych Review & Screening - 01/26/19 1422      Initial Review   Current issues with  Current Stress Concerns    Comments  Perley reported sleeping well, having a good support system, but very stressed about COVID. He is worried about himself and his family/friends. He is getting his meals and is financially okay for now.      Family Dynamics   Good Support System?  Yes      Barriers   Psychosocial barriers to participate in program  There are no identifiable barriers or psychosocial needs.      Screening Interventions   Interventions  Encouraged to exercise    Expected Outcomes  Short Term goal: Utilizing psychosocial counselor, staff and physician to assist with identification of specific Stressors or current issues interfering with healing process. Setting desired goal for each stressor or current issue identified.;Long Term Goal: Stressors or current issues are controlled or eliminated.;Short Term goal: Identification and review with participant of any Quality of Life or Depression concerns found by scoring the questionnaire.;Long Term goal: The participant improves quality of Life  and PHQ9 Scores as seen by post scores and/or verbalization of changes       Quality of Life Scores:  Quality of Life - 01/29/19 1338      Quality of Life   Select  Quality of Life      Quality of Life Scores   Health/Function Pre  27.2 %    Socioeconomic Pre  28.29 %    Psych/Spiritual Pre  28.29 %    Family Pre  30 %    GLOBAL Pre  27.94 %      Scores of 19 and below usually indicate a poorer quality of life in these areas.  A difference of  2-3 points is a clinically meaningful difference.  A difference of 2-3 points in the total score of the Quality of Life Index has been associated with significant improvement in overall quality of life, self-image, physical symptoms, and general health in studies assessing change in quality of life.  PHQ-9: Recent Review Flowsheet Data    Depression screen [facility] 2/9 10/05/2015   Decreased Interest 1   Down, Depressed, Hopeless 0   PHQ - 2 Score 1     Interpretation of Total Score  Total Score Depression Severity:  1-4 = Minimal depression, 5-9 = Mild depression, 10-14 = Moderate depression, 15-19 = Moderately severe depression, 20-27 = Severe depression   Psychosocial Evaluation and Intervention:   Psychosocial Re-Evaluation:   Psychosocial Discharge (Final Psychosocial Re-Evaluation):   Vocational Rehabilitation: Provide vocational rehab assistance to qualifying candidates.   Vocational Rehab Evaluation & Intervention: Vocational Rehab - 01/26/19 1422      Initial Vocational Rehab Evaluation & Intervention   Assessment shows need for Vocational Rehabilitation  No       Education: Education Goals: Education classes will be provided on a variety of topics geared toward better understanding of heart health and risk factor modification. Participant will state understanding/return demonstration of topics presented as noted by education test scores.  Learning Barriers/Preferences: Learning Barriers/Preferences - 01/26/19 1422       Learning Barriers/Preferences   Learning Barriers  None    Learning Preferences  Individual Instruction       Education Topics:  AED/CPR: - Group verbal and written instruction with the use of models to demonstrate the basic use of the AED with the basic ABC's of resuscitation.   General Nutrition Guidelines/Fats and Fiber: -Group instruction provided by verbal, written material, models and posters to present the general guidelines for heart healthy nutrition. Gives an explanation and review of dietary fats and fiber.   Controlling Sodium/Reading Food Labels: -Group verbal and written material supporting the discussion of sodium use in heart healthy nutrition. Review and explanation with models, verbal and written materials for utilization of the food label.   Exercise Physiology & General Exercise Guidelines: - Group verbal and written instruction with models to review the exercise physiology of the cardiovascular system and associated critical values. Provides general exercise guidelines with specific guidelines to those with heart or lung disease.    Aerobic Exercise & Resistance Training: - Gives group  verbal and written instruction on the various components of exercise. Focuses on aerobic and resistive training programs and the benefits of this training and how to safely progress through these programs..   Flexibility, Balance, Mind/Body Relaxation: Provides group verbal/written instruction on the benefits of flexibility and balance training, including mind/body exercise modes such as yoga, pilates and tai chi.  Demonstration and skill practice provided.   Stress and Anxiety: - Provides group verbal and written instruction about the health risks of elevated stress and causes of high stress.  Discuss the correlation between heart/lung disease and anxiety and treatment options. Review healthy ways to manage with stress and anxiety.   Depression: - Provides group verbal and  written instruction on the correlation between heart/lung disease and depressed mood, treatment options, and the stigmas associated with seeking treatment.   Anatomy & Physiology of the Heart: - Group verbal and written instruction and models provide basic cardiac anatomy and physiology, with the coronary electrical and arterial systems. Review of Valvular disease and Heart Failure   Cardiac Procedures: - Group verbal and written instruction to review commonly prescribed medications for heart disease. Reviews the medication, class of the drug, and side effects. Includes the steps to properly store meds and maintain the prescription regimen. (beta blockers and nitrates)   Cardiac Medications I: - Group verbal and written instruction to review commonly prescribed medications for heart disease. Reviews the medication, class of the drug, and side effects. Includes the steps to properly store meds and maintain the prescription regimen.   Cardiac Medications II: -Group verbal and written instruction to review commonly prescribed medications for heart disease. Reviews the medication, class of the drug, and side effects. (all other drug classes)    Go Sex-Intimacy & Heart Disease, Get SMART - Goal Setting: - Group verbal and written instruction through game format to discuss heart disease and the return to sexual intimacy. Provides group verbal and written material to discuss and apply goal setting through the application of the S.M.A.R.T. Method.   Other Matters of the Heart: - Provides group verbal, written materials and models to describe Stable Angina and Peripheral Artery. Includes description of the disease process and treatment options available to the cardiac patient.   Exercise & Equipment Safety: - Individual verbal instruction and demonstration of equipment use and safety with use of the equipment.   Cardiac Rehab from 01/29/2019 in [facility] Cardiac and Pulmonary Rehab  Date  01/29/19   Educator  [facility]  Instruction Review Code  1- Verbalizes Understanding      Infection Prevention: - Provides verbal and written material to individual with discussion of infection control including proper hand washing and proper equipment cleaning during exercise session.   Cardiac Rehab from 01/29/2019 in [facility] Cardiac and Pulmonary Rehab  Date  01/29/19  Educator  [facility]  Instruction Review Code  1- Verbalizes Understanding      Falls Prevention: - Provides verbal and written material to individual with discussion of falls prevention and safety.   Cardiac Rehab from 01/29/2019 in [facility] Cardiac and Pulmonary Rehab  Date  01/29/19  Educator  [facility]  Instruction Review Code  1- Verbalizes Understanding      Diabetes: - Individual verbal and written instruction to review signs/symptoms of diabetes, desired ranges of glucose level fasting, after meals and with exercise. Acknowledge that pre and post exercise glucose checks will be done for 3 sessions at entry of program.   Cardiac Rehab from 01/29/2019 in [facility] Cardiac [facility] Pul[facility] Re[facility]  Date  01/26/19  Educator  [place]  Instruction Review Code  1- Verbalizes Understanding      Know Your Numbers and Risk Factors: -Group verbal and written instruction about important numbers in your health.  Discussion of what are risk factors and how they play a role in the disease process.  Review of Cholesterol, Blood Pressure, Diabetes, and BMI and the role they play in your overall health.   Sleep Hygiene: -Provides group verbal and written instruction about how sleep can affect your health.  Define sleep hygiene, discuss sleep cycles and impact of sleep habits. Review good sleep hygiene tips.    Other: -Provides group and verbal instruction on various topics (see comments)   Knowledge Questionnaire Score: Knowledge Questionnaire Score - 01/29/19 1338      Knowledge Questionnaire Score   Pre Score  18/23   Education Focus: MI, A&P, Diabetes, PAD,  Nutrition, Exercise, Cessation      Core Components/Risk Factors/Patient Goals at Admission: Personal Goals and Risk Factors at Admission - 01/29/19 1341      Core Components/Risk Factors/Patient Goals on Admission    Weight Management  Yes;Obesity;Weight Loss    Intervention  Weight Management: Develop a combined nutrition and exercise program designed to reach desired caloric intake, while maintaining appropriate intake of nutrient and fiber, sodium and fats, and appropriate energy expenditure required for the weight goal.;Weight Management: Provide education and appropriate resources to help participant work on and attain dietary goals.;Weight Management/Obesity: Establish reasonable short term and long term weight goals.;Obesity: Provide education and appropriate resources to help participant work on and attain dietary goals.    Admit Weight  287 lb 12.8 oz (130.5 kg)    Goal Weight: Short Term  282 lb (127.9 kg)    Goal Weight: Long Term  270 lb (122.5 kg)    Expected Outcomes  Short Term: Continue to assess and modify interventions until short term weight is achieved;Long Term: Adherence to nutrition and physical activity/exercise program aimed toward attainment of established weight goal;Weight Loss: Understanding of general recommendations for a balanced deficit meal plan, which promotes 1-2 lb weight loss per week and includes a negative energy balance of [phone] kcal/d;Understanding recommendations for meals to include 15-35% energy as protein, 25-35% energy from fat, 35-60% energy from carbohydrates, less than 242m of dietary cholesterol, 20-35 gm of total fiber daily;Understanding of distribution of calorie intake throughout the day with the consumption of 4-5 meals/snacks    Diabetes  Yes    Intervention  Provide education about signs/symptoms and action to take for hypo/hyperglycemia.;Provide education about proper nutrition, including hydration, and aerobic/resistive exercise  prescription along with prescribed medications to achieve blood glucose in normal ranges: Fasting glucose 65-99 mg/dL    Expected Outcomes  Short Term: Participant verbalizes understanding of the signs/symptoms and immediate care of hyper/hypoglycemia, proper foot care and importance of medication, aerobic/resistive exercise and nutrition plan for blood glucose control.;Long Term: Attainment of HbA1C < 7%.    Heart Failure  Yes    Intervention  Provide a combined exercise and nutrition program that is supplemented with education, support and counseling about heart failure. Directed toward relieving symptoms such as shortness of breath, decreased exercise tolerance, and extremity edema.    Expected Outcomes  Improve functional capacity of life;Short term: Attendance in program 2-3 days a week with increased exercise capacity. Reported lower sodium intake. Reported increased fruit and vegetable intake. Reports medication compliance.;Short term: Daily weights obtained and reported for increase. Utilizing diuretic protocols set by physician.;Long term:  Adoption of self-care skills and reduction of barriers for early signs and symptoms recognition and intervention leading to self-care maintenance.    Hypertension  Yes    Intervention  Provide education on lifestyle modifcations including regular physical activity/exercise, weight management, moderate sodium restriction and increased consumption of fresh fruit, vegetables, and low fat dairy, alcohol moderation, and smoking cessation.;Monitor prescription use compliance.    Expected Outcomes  Short Term: Continued assessment and intervention until BP is < 140/25m HG in hypertensive participants. < 130/888mHG in hypertensive participants with diabetes, heart failure or chronic kidney disease.;Long Term: Maintenance of blood pressure at goal levels.    Lipids  Yes    Intervention  Provide education and support for participant on nutrition & aerobic/resistive  exercise along with prescribed medications to achieve LDL <7063mHDL >61m64m  Expected Outcomes  Short Term: Participant states understanding of desired cholesterol values and is compliant with medications prescribed. Participant is following exercise prescription and nutrition guidelines.;Long Term: Cholesterol controlled with medications as prescribed, with individualized exercise RX and with personalized nutrition plan. Value goals: LDL < 70mg98mL > 40 mg.       Core Components/Risk Factors/Patient Goals Review:    Core Components/Risk Factors/Patient Goals at Discharge (Final Review):    ITP Comments: ITP Comments    Row Name 01/26/19 1421 01/29/19 1328 02/02/19 1136 02/06/19 1122 02/18/19 0551   ITP Comments  Virtual Orientation completed. Diagnosis can be found in [facility] [employer] Scheduled for EP/RD orientation 7/23 at 11  6MWT and orientation completed.  Initial ITP created and sent to Dr. Bert Ramonita Labring for Medical Director Dr. Mark Emily Filbertrst full day of exercise!  Patient was oriented to gym and equipment including functions, settings, policies, and procedures.  Patient's individual exercise prescription and treatment plan were reviewed.  All starting workloads were established based on the results of the 6 minute walk test done at initial orientation visit.  The plan for exercise progression was also introduced and progression will be customized based on patient's performance and goals.  Returned [employer] slipped in the driveway this morning and hurt his foot.  I encouraged him to seek care if swelling or pain increase or continue.  He hopes to return on Monday.  30 Day Review Completed today. Continue with ITP unless changed by Medical Director review.   Row NMalone 02/18/19 0953 00928/20 0911 03/18/19 0552 03/31/19 1541 04/15/19 1338   ITP Comments  GarryHoy Morned to let us kn[place] he will not be in class until he can get his car fixed.  GarryRaye he cannot use ACTA since he has a  car.  I advised him to call and see if he can use ACTA since his car is not working.  Unable to complete nutrition 30-day re-eval cycle due to inconsistent attendence  30 Day review. Continue with ITP unless directed changes per Medical Director review. Out since early Aug Transportation problems  Continues to have issues with transportation.  Department is learning about new options.  Still out with transportation issues.  30 day review completed. ITP sent to Dr. Mark Emily Filbertical Director of Cardiac and Pulmonary Rehab. Continue with ITP unless changes are made by physician.  Department closed starting 10/2 until further notice by infection prevention and Health at Work teams for COVIDGenevaow NBuhl 04/21/19 1009 05/13/19 0616 06/10/19 0930 06/16/19 0822     ITP Comments  Continues to be out due to transportation issues.  30 day review completed. Continue with ITP sent to Dr. Emily Filbert, Medical Director of Cardiac and Pulmonary Rehab for review , changes as needed and signature.  Remains out with transportation issues  30 day review competed . ITP sent to Dr Emily Filbert for review, changes as needed and ITP approval signature. Remains out  [place] has timed out. We will discharge at this time.       Comments: Discharge ITP

## 2019-06-16 NOTE — Progress Notes (Signed)
Discharge Progress Report  Patient Details  Name: Derrick Barajas MRN: [id number] Date of Birth: March 26, 1956 Referring Provider:     Cardiac Rehab from 01/29/2019 in [facility] Cardiac and Pulmonary Rehab  Referring Provider  Adrian Blackwater MD       Number of Visits: 6  Reason for Discharge:  Early Exit:  Lack of attendance and Transportation  Smoking History:  Social History   Tobacco Use  Smoking Status Never Smoker  Smokeless Tobacco Never Used    Diagnosis:  Status post coronary artery stent placement  ADL UCSD:   Initial Exercise Prescription: Initial Exercise Prescription - 01/29/19 1300      Date of Initial Exercise RX and Referring Provider   Date  01/29/19    Referring Provider  Adrian Blackwater MD      Treadmill   MPH  1.6    Grade  0    Minutes  15    METs  2.23      Recumbant Bike   Level  1    RPM  50    Watts  10    Minutes  15    METs  2.2      NuStep   Level  1    SPM  80    Minutes  15    METs  2      Biostep-RELP   Level  1    SPM  50    Minutes  15    METs  2      Prescription Details   Frequency (times per week)  3    Duration  Progress to 30 minutes of continuous aerobic without signs/symptoms of physical distress      Intensity   THRR 40-80% of Max Heartrate  107-140    Ratings of Perceived Exertion  11-13    Perceived Dyspnea  0-4      Progression   Progression  Continue to progress workloads to maintain intensity without signs/symptoms of physical distress.      Resistance Training   Training Prescription  Yes    Weight  3 lbs    Reps  10-15       Discharge Exercise Prescription (Final Exercise Prescription Changes): Exercise Prescription Changes - 02/09/19 0900      Response to Exercise   Blood Pressure (Admit)  148/82    Blood Pressure (Exercise)  136/74    Blood Pressure (Exit)  150/92    Heart Rate (Admit)  52 bpm    Heart Rate (Exercise)  105 bpm    Heart Rate (Exit)  96 bpm    Rating of Perceived Exertion  (Exercise)  13    Symptoms  none    Duration  Progress to 30 minutes of  aerobic without signs/symptoms of physical distress    Intensity  THRR unchanged      Progression   Progression  Continue to progress workloads to maintain intensity without signs/symptoms of physical distress.    Average METs  2.41      Resistance Training   Training Prescription  Yes    Weight  3 lbs    Reps  10-15      Interval Training   Interval Training  No      Treadmill   MPH  1.6    Grade  0    Minutes  15    METs  2.23      Recumbant Bike   Level  3    Watts  26    Minutes  15    METs  2.61      NuStep   Level  1    Minutes  15    METs  1.8      Biostep-RELP   Level  3    Minutes  15    METs  3       Functional Capacity: 6 Minute Walk    Row Name 01/29/19 1329         6 Minute Walk   Phase  Initial     Distance  1100 feet     Walk Time  6 minutes     # of Rest Breaks  0     MPH  2.08     METS  2.26     RPE  14     Perceived Dyspnea   2     VO2 Peak  7.92     Symptoms  Yes (comment)     Comments  SOB     Resting HR  74 bpm     Resting BP  134/74     Resting Oxygen Saturation   98 %     Exercise Oxygen Saturation  during 6 min walk  94 %     Max Ex. HR  107 bpm     Max Ex. BP  146/64     2 Minute Post BP  126/64        Psychological, QOL, Others - Outcomes: PHQ 2/9: Depression screen PHQ 2/9 10/05/2015  Decreased Interest 1  Down, Depressed, Hopeless 0  PHQ - 2 Score 1    Quality of Life: Quality of Life - 01/29/19 1338      Quality of Life   Select  Quality of Life      Quality of Life Scores   Health/Function Pre  27.2 %    Socioeconomic Pre  28.29 %    Psych/Spiritual Pre  28.29 %    Family Pre  30 %    GLOBAL Pre  27.94 %       Personal Goals: Goals established at orientation with interventions provided to work toward goal. Personal Goals and Risk Factors at Admission - 01/29/19 1341      Core Components/Risk Factors/Patient Goals on  Admission    Weight Management  Yes;Obesity;Weight Loss    Intervention  Weight Management: Develop a combined nutrition and exercise program designed to reach desired caloric intake, while maintaining appropriate intake of nutrient and fiber, sodium and fats, and appropriate energy expenditure required for the weight goal.;Weight Management: Provide education and appropriate resources to help participant work on and attain dietary goals.;Weight Management/Obesity: Establish reasonable short term and long term weight goals.;Obesity: Provide education and appropriate resources to help participant work on and attain dietary goals.    Admit Weight  287 lb 12.8 oz (130.5 kg)    Goal Weight: Short Term  282 lb (127.9 kg)    Goal Weight: Long Term  270 lb (122.5 kg)    Expected Outcomes  Short Term: Continue to assess and modify interventions until short term weight is achieved;Long Term: Adherence to nutrition and physical activity/exercise program aimed toward attainment of established weight goal;Weight Loss: Understanding of general recommendations for a balanced deficit meal plan, which promotes 1-2 lb weight loss per week and includes a negative energy balance of [phone] kcal/d;Understanding recommendations for meals to include 15-35% energy as protein, 25-35% energy from fat, 35-60% energy from carbohydrates, less than  200mg  of dietary cholesterol, 20-35 gm of total fiber daily;Understanding of distribution of calorie intake throughout the day with the consumption of 4-5 meals/snacks    Diabetes  Yes    Intervention  Provide education about signs/symptoms and action to take for hypo/hyperglycemia.;Provide education about proper nutrition, including hydration, and aerobic/resistive exercise prescription along with prescribed medications to achieve blood glucose in normal ranges: Fasting glucose 65-99 mg/dL    Expected Outcomes  Short Term: Participant verbalizes understanding of the signs/symptoms and  immediate care of hyper/hypoglycemia, proper foot care and importance of medication, aerobic/resistive exercise and nutrition plan for blood glucose control.;Long Term: Attainment of HbA1C < 7%.    Heart Failure  Yes    Intervention  Provide a combined exercise and nutrition program that is supplemented with education, support and counseling about heart failure. Directed toward relieving symptoms such as shortness of breath, decreased exercise tolerance, and extremity edema.    Expected Outcomes  Improve functional capacity of life;Short term: Attendance in program 2-3 days a week with increased exercise capacity. Reported lower sodium intake. Reported increased fruit and vegetable intake. Reports medication compliance.;Short term: Daily weights obtained and reported for increase. Utilizing diuretic protocols set by physician.;Long term: Adoption of self-care skills and reduction of barriers for early signs and symptoms recognition and intervention leading to self-care maintenance.    Hypertension  Yes    Intervention  Provide education on lifestyle modifcations including regular physical activity/exercise, weight management, moderate sodium restriction and increased consumption of fresh fruit, vegetables, and low fat dairy, alcohol moderation, and smoking cessation.;Monitor prescription use compliance.    Expected Outcomes  Short Term: Continued assessment and intervention until BP is < 140/3790mm HG in hypertensive participants. < 130/1580mm HG in hypertensive participants with diabetes, heart failure or chronic kidney disease.;Long Term: Maintenance of blood pressure at goal levels.    Lipids  Yes    Intervention  Provide education and support for participant on nutrition & aerobic/resistive exercise along with prescribed medications to achieve LDL 70mg , HDL >40mg .    Expected Outcomes  Short Term: Participant states understanding of desired cholesterol values and is compliant with medications prescribed.  Participant is following exercise prescription and nutrition guidelines.;Long Term: Cholesterol controlled with medications as prescribed, with individualized exercise RX and with personalized nutrition plan. Value goals: LDL < 70mg , HDL > 40 mg.        Personal Goals Discharge:   Exercise Goals and Review: Exercise Goals    Row Name 01/29/19 1335             Exercise Goals   Increase Physical Activity  Yes       Intervention  Provide advice, education, support and counseling about physical activity/exercise needs.;Develop an individualized exercise prescription for aerobic and resistive training based on initial evaluation findings, risk stratification, comorbidities and participant's personal goals.       Expected Outcomes  Short Term: Attend rehab on a regular basis to increase amount of physical activity.;Long Term: Add in home exercise to make exercise part of routine and to increase amount of physical activity.;Long Term: Exercising regularly at least 3-5 days a week.       Increase Strength and Stamina  Yes       Intervention  Provide advice, education, support and counseling about physical activity/exercise needs.;Develop an individualized exercise prescription for aerobic and resistive training based on initial evaluation findings, risk stratification, comorbidities and participant's personal goals.       Expected Outcomes  Short Term: Increase  workloads from initial exercise prescription for resistance, speed, and METs.;Short Term: Perform resistance training exercises routinely during rehab and add in resistance training at home;Long Term: Improve cardiorespiratory fitness, muscular endurance and strength as measured by increased METs and functional capacity ( )       Able to understand and use rate of perceived exertion (RPE) scale  Yes       Intervention  Provide education and explanation on how to use RPE scale       Expected Outcomes  Short Term: Able to use RPE daily in  rehab to express subjective intensity level;Long Term:  Able to use RPE to guide intensity level when exercising independently       Able to understand and use Dyspnea scale  Yes       Intervention  Provide education and explanation on how to use Dyspnea scale       Expected Outcomes  Short Term: Able to use Dyspnea scale daily in rehab to express subjective sense of shortness of breath during exertion;Long Term: Able to use Dyspnea scale to guide intensity level when exercising independently       Knowledge and understanding of Target Heart Rate Range (THRR)  Yes       Intervention  Provide education and explanation of THRR including how the numbers were predicted and where they are located for reference       Expected Outcomes  Short Term: Able to state/look up THRR;Short Term: Able to use daily as guideline for intensity in rehab;Long Term: Able to use THRR to govern intensity when exercising independently       Able to check pulse independently  Yes       Intervention  Provide education and demonstration on how to check pulse in carotid and radial arteries.;Review the importance of being able to check your own pulse for safety during independent exercise       Expected Outcomes  Short Term: Able to explain why pulse checking is important during independent exercise;Long Term: Able to check pulse independently and accurately       Understanding of Exercise Prescription  Yes       Intervention  Provide education, explanation, and written materials on patient's individual exercise prescription       Expected Outcomes  Short Term: Able to explain program exercise prescription;Long Term: Able to explain home exercise prescription to exercise independently          Exercise Goals Re-Evaluation: Exercise Goals Re-Evaluation    Row Name 02/02/19 1136 02/09/19 0924 03/31/19 1542 04/21/19 1009       Exercise Goal Re-Evaluation   Exercise Goals Review  Increase Physical Activity;Able to understand and  use rate of perceived exertion (RPE) scale;Knowledge and understanding of Target Heart Rate Range (THRR);Understanding of Exercise Prescription;Increase Strength and Stamina;Able to check pulse independently  Increase Physical Activity;Increase Strength and Stamina;Understanding of Exercise Prescription  -  -    Comments  Reviewed RPE scale, THR and program prescription with pt today.  Pt voiced understanding and was given a copy of goals to take home.  Derrick Barajas has completed two full days of exercise.  He hurt his foot on Friday and was not able to come in.  He is off to a good start.  We will continue to monitor his progress.  Out since last review  Out since last review    Expected Outcomes  Short: Use RPE daily to regulate intensity. Long: Follow program prescription in THR.  Short: Continue  to attend rehab regularly.  Long: Continue to follow program prescription.  -  -       Nutrition & Weight - Outcomes: Pre Biometrics - 01/29/19 1337      Pre Biometrics   Height  5' 9.5" (1.765 m)    Weight  287 lb 12.8 oz (130.5 kg)    BMI (Calculated)  41.91    Single Leg Stand  2 seconds        Nutrition: Nutrition Therapy & Goals - 01/29/19 1223      Nutrition Therapy   Diet  Low Na, HH, diabetic diet    Protein (specify units)  100g    Fiber  25 grams    Whole Grain Foods  3 servings    Saturated Fats  12 max. grams    Fruits and Vegetables  5 servings/day    Sodium  1.5 grams      Personal Nutrition Goals   Nutrition Goal  ST: continue with HH eating and label reading LT: lose weight (unspecified goal weight)    Comments  Pt reports normally eating [place] bacon with egg whites (sometimes whole eggs) with some cheese or oatmeal in the am. fried fish or baked chicken witht the skin, grain or squash, and collards, lettuce, or other greens (similar dinner). Pt will eat snowqueen diet ice cream most nights, every friday will have popcorn and snacks. once a month will have lite beer. Meals on  wheels will bring him frozen regular and diet meals for the week (replaces L or D). Uses low fat dairy (aside from cheese) and will try to get olive oil or spreads made with olive oil. Disucssed HH DM diet. discussed the importance of BG control, monitoring, frequent meals, composition of meals (pt reports choosing whoel grains), discussed what to do if BG gets too low. Pt reports wanting to lose weight for his heart, discussed healthy weight loss and how healthy habits and lifestyle is more important. Pt did not want to make any changes. (discussed maybe adding protein extenders (beans) and additional veggies to frozen meals to bulk it up - can also help manage sodium).      Intervention Plan   Intervention  Prescribe, educate and counsel regarding individualized specific dietary modifications aiming towards targeted core components such as weight, hypertension, lipid management, diabetes, heart failure and other comorbidities.;Nutrition handout(s) given to patient.    Expected Outcomes  Short Term Goal: Understand basic principles of dietary content, such as calories, fat, sodium, cholesterol and nutrients.;Short Term Goal: A plan has been developed with personal nutrition goals set during dietitian appointment.;Long Term Goal: Adherence to prescribed nutrition plan.       Nutrition Discharge: Nutrition Assessments - 01/29/19 1243      MEDFICTS Scores   Pre Score  51       Education Questionnaire Score: Knowledge Questionnaire Score - 01/29/19 1338      Knowledge Questionnaire Score   Pre Score  18/23   Education Focus: MI, A&P, Diabetes, PAD, Nutrition, Exercise, Cessation      Goals reviewed with patient; copy given to patient.

## 2019-09-07 ENCOUNTER — Ambulatory Visit: Payer: Medicaid Other | Admitting: Podiatry

## 2019-09-17 ENCOUNTER — Encounter: Payer: Self-pay | Admitting: Podiatry

## 2019-09-17 ENCOUNTER — Other Ambulatory Visit: Payer: Self-pay

## 2019-09-17 ENCOUNTER — Ambulatory Visit: Payer: Medicaid Other | Admitting: Podiatry

## 2019-09-17 DIAGNOSIS — B351 Tinea unguium: Secondary | ICD-10-CM | POA: Diagnosis not present

## 2019-09-17 DIAGNOSIS — M79676 Pain in unspecified toe(s): Secondary | ICD-10-CM | POA: Diagnosis not present

## 2019-09-17 DIAGNOSIS — E0842 Diabetes mellitus due to underlying condition with diabetic polyneuropathy: Secondary | ICD-10-CM | POA: Diagnosis not present

## 2019-09-17 NOTE — Progress Notes (Signed)
This patient returns to my office for at risk foot care.  This patient requires this care by a professional since this patient will be at risk due to having  Diabetes.  This patient is unable to cut nails himself  since the patient cannot reach his  nails.These nails are painful walking and wearing shoes.  This patient presents for at risk foot care today.  General Appearance  Alert, conversant and in no acute stress.  Vascular  Dorsalis pedis and posterior tibial  pulses are palpable  bilaterally.  Capillary return is within normal limits  bilaterally. Temperature is within normal limits  bilaterally.  Neurologic  Senn-Weinstein monofilament wire test within normal limits  bilaterally. Muscle power within normal limits bilaterally.  Nails Thick disfigured discolored nails with subungual debris  from hallux to fifth toes bilaterally. No evidence of bacterial infection or drainage bilaterally.  Orthopedic  No limitations of motion  feet .  No crepitus or effusions noted.  No bony pathology or digital deformities noted.  Skin  normotropic skin with no porokeratosis noted bilaterally.  No signs of infections or ulcers noted.     Onychomycosis  Pain in right toes  Pain in left toes  Consent was obtained for treatment procedures.   Mechanical debridement of nails 1-5  bilaterally performed with a nail nipper.  Filed with dremel without incident. No infection or ulcer.     Return office visit   3 months        Told patient to return for periodic foot care and evaluation due to potential at risk complications.   Helane Gunther DPM

## 2019-09-20 ENCOUNTER — Emergency Department
Admission: EM | Admit: 2019-09-20 | Discharge: 2019-09-20 | Disposition: A | Discharge status: Home / Self Care | Payer: Medicaid Other | Attending: Student | Admitting: Student

## 2019-09-20 ENCOUNTER — Other Ambulatory Visit: Payer: Self-pay

## 2019-09-20 DIAGNOSIS — I69392 Facial weakness following cerebral infarction: Secondary | ICD-10-CM | POA: Diagnosis not present

## 2019-09-20 DIAGNOSIS — Z794 Long term (current) use of insulin: Secondary | ICD-10-CM | POA: Diagnosis not present

## 2019-09-20 DIAGNOSIS — L02211 Cutaneous abscess of abdominal wall: Secondary | ICD-10-CM | POA: Diagnosis not present

## 2019-09-20 DIAGNOSIS — I110 Hypertensive heart disease with heart failure: Secondary | ICD-10-CM | POA: Diagnosis not present

## 2019-09-20 DIAGNOSIS — I252 Old myocardial infarction: Secondary | ICD-10-CM | POA: Diagnosis not present

## 2019-09-20 DIAGNOSIS — I2510 Atherosclerotic heart disease of native coronary artery without angina pectoris: Secondary | ICD-10-CM | POA: Insufficient documentation

## 2019-09-20 DIAGNOSIS — I509 Heart failure, unspecified: Secondary | ICD-10-CM | POA: Diagnosis not present

## 2019-09-20 DIAGNOSIS — Z79899 Other long term (current) drug therapy: Secondary | ICD-10-CM | POA: Insufficient documentation

## 2019-09-20 DIAGNOSIS — E063 Autoimmune thyroiditis: Secondary | ICD-10-CM | POA: Insufficient documentation

## 2019-09-20 DIAGNOSIS — E119 Type 2 diabetes mellitus without complications: Secondary | ICD-10-CM | POA: Diagnosis not present

## 2019-09-20 MED ORDER — LIDOCAINE HCL 1 % IJ SOLN
5.0000 mL | Freq: Once | INTRAMUSCULAR | Status: AC
  Administered 2019-09-20: 16:00:00 5 mL
  Filled 2019-09-20: qty 10

## 2019-09-20 MED ORDER — CLINDAMYCIN HCL 300 MG PO CAPS: 300.0000 mg | ORAL_CAPSULE | Freq: Three times a day (TID) | ORAL | 0 refills | Status: AC

## 2019-09-20 MED ORDER — CLINDAMYCIN PHOSPHATE 600 MG/4ML IJ SOLN
600.0000 mg | Freq: Once | INTRAMUSCULAR | Status: AC
  Administered 2019-09-20: 600 mg via INTRAMUSCULAR
  Filled 2019-09-20: qty 4

## 2019-09-20 NOTE — ED Triage Notes (Signed)
Pt comes in POV with injection site infection from insulin. Pt states he has been seen and has taken antibiotics for it and finished them but it doesn't seem to be better. Site is warm to touch and red around it, peeling like healing but center has blackened area, no smell or discharge. Denies any fevers.

## 2019-09-20 NOTE — ED Provider Notes (Signed)
Emergency Department Provider Note  ____________________________________________  Time seen: Approximately 3:46 PM  I have reviewed the triage vital signs and the nursing notes.   HISTORY  Chief Complaint No chief complaint on file.   Historian Patient     HPI Derrick Barajas is a 64 y.o. male with a history of CHF, CAD, diabetes and hypertension, presents to the emergency department with an abscess of the right lower quadrant of abdomen.  Patient states that he injected his insulin with a new needle and then several days later, noticed redness and drainage from the area.  Abscess seems to be worsening.  He denies fever and chills.  Denies a history of cutaneous abscesses in the past.  He has addressed his symptoms with his primary care provider who started him on Augmentin.  No other alleviating measures have been attempted.   Past Medical History:  Diagnosis Date  . CAD (coronary artery disease)   . CHF (congestive heart failure) (HCC)   . Diabetes mellitus without complication (HCC)   . Gout   . Hyperlipidemia   . Hypertension   . MI (myocardial infarction) (HCC)   . RA (rheumatoid arthritis) (HCC)   . Sleep apnea   . 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 Thyroid disease      Immunizations up to date:  Yes.     Past Medical History:  Diagnosis Date  . CAD (coronary artery disease)   . CHF (congestive heart failure) (HCC)   . Diabetes mellitus without complication (HCC)   . Gout   . Hyperlipidemia   . Hypertension   . MI (myocardial infarction) (HCC)   . RA (rheumatoid arthritis) (HCC)   . Sleep apnea   . 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 Thyroid disease     Patient Active Problem List   Diagnosis Date Noted  . S/P drug eluting coronary stent placement 11/17/2018  . Unstable angina (HCC) 11/13/2018  . Hyperlipidemia, mixed 08/19/2018  . 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 Complete tear of right rotator  cuff 03/27/2016  . Diabetes mellitus type 2, uncomplicated (HCC) 10/26/2015  . Hypertension 10/26/2015  . Myocardial infarction (HCC) 10/26/2015    Past Surgical History:  Procedure Laterality Date  . CIRCUMCISION    . CORONARY ANGIOPLASTY WITH STENT PLACEMENT    . CORONARY STENT INTERVENTION N/A 11/17/2018   Procedure: CORONARY STENT INTERVENTION;  Surgeon: Alwyn Pea, MD;  Location: [facility] INVASIVE CV LAB;  Service: Cardiovascular;  Laterality: N/A;  . LEFT HEART CATH AND CORONARY ANGIOGRAPHY Left 11/17/2018   Procedure: LEFT HEART CATH AND CORONARY ANGIOGRAPHY;  Surgeon: Laurier Nancy, MD;  Location: [facility] INVASIVE CV LAB;  Service: Cardiovascular;  Laterality: Left;  . TONSILLECTOMY      Prior to Admission medications   Medication Sig Start Date End Date Taking? Authorizing Provider  ACCU-CHEK AVIVA PLUS test strip TEST TID 02/02/17   [provider]  amLODipine (NORVASC) 10 MG tablet Take 10 mg by mouth daily.     [provider]  aspirin EC 81 MG tablet Take 81 mg by mouth daily.    [provider]  B-D ULTRAFINE III SHORT PEN 31G X 8 MM MISC USE UTD BID 04/10/17   [provider]  clindamycin (CLEOCIN) 300 MG capsule Take 1 capsule (300 mg total) by mouth 3 (three) times daily for 10 days. 09/20/19 09/30/19  Orvil Feil, PA-C  docusate  sodium (COLACE) 100 MG capsule Take 100 mg at bedtime by mouth.     [provider]  ezetimibe (ZETIA) 10 MG tablet Take 10 mg by mouth 2 (two) times daily.    Adrian Blackwater A, MD  fluticasone [facility]) 50 MCG/ACT nasal spray Place 1 spray into both nostrils daily.  12/05/16   [provider]  furosemide (LASIX) 20 MG tablet Take 20 mg by mouth 2 (two) times daily.     [provider]  Insulin Degludec (TRESIBA FLEXTOUCH) 200 UNIT/ML SOPN Inject 140 Units into the skin daily.     [provider]  INVOKANA 300 MG TABS tablet Take 300 mg by mouth daily.  10/07/15   [provider]  isosorbide mononitrate (IMDUR) 60 MG 24 hr tablet Take 60 mg daily by mouth.    Caroleen Hamman, FNP  levothyroxine (SYNTHROID, LEVOTHROID) 125 MCG tablet Take 125 mcg by mouth daily before breakfast.  09/26/15   [provider]  lisinopril (ZESTRIL) 40 MG tablet Take by mouth.    [provider]  metFORMIN (GLUCOPHAGE-XR) 500 MG 24 hr tablet  11/26/18   [provider]  metoprolol succinate (TOPROL-XL) 100 MG 24 hr tablet Take 100 mg daily by mouth. Take with or immediately following a meal.    Caroleen Hamman, FNP  mupirocin ointment (BACTROBAN) 2 % Apply to affected area 3 times daily until healed. 12/13/18 12/13/19  Triplett, Rulon Eisenmenger B, FNP  olopatadine (PATANOL) 0.1 % ophthalmic solution Place 1 drop into both eyes daily.    [provider]  prasugrel (EFFIENT) 10 MG TABS Take 10 mg by mouth daily at 3 pm.     [provider]  ramipril (ALTACE) 10 MG capsule Take 10 mg by mouth daily.  09/14/15   [provider]  ranolazine (RANEXA) 500 MG 12 hr tablet Take 500 mg by mouth 2 (two) times daily.    [provider]  rosuvastatin (CRESTOR) 20 MG tablet TK 1 T PO QD 03/12/19   [provider]  triamcinolone ointment (KENALOG) 0.5 % Apply 1 application topically 2 (two) times daily. 12/13/18   Triplett, Rulon Eisenmenger B, FNP  TRULICITY 1.5 MG/0.5ML SOPN Inject 1.5 mg into the skin every Wednesday.  11/30/17   [provider]    Allergies Shellfish-derived products, Sulfa antibiotics, Iodine, Atorvastatin, Iodinated diagnostic agents, Nsaids, and Shrimp [shellfish allergy]  Family History  Problem Relation Age of Onset  . Diabetes Mother   . Diabetes Brother     Social History Social History   Tobacco Use  . Smoking status: Never Smoker  . Smokeless tobacco: Never Used  Substance Use Topics  . Alcohol use: No    Alcohol/week: 0.0 standard drinks  . Drug use: No     Review of Systems  Constitutional: No  fever/chills Eyes:  No discharge ENT: No upper respiratory complaints. Respiratory: no cough. No SOB/ use of accessory muscles to breath Gastrointestinal:   No nausea, no vomiting.  No diarrhea.  No constipation. Musculoskeletal: Negative for musculoskeletal pain. Skin: Patient has abscess.     ____________________________________________   PHYSICAL EXAM:  VITAL SIGNS: ED Triage Vitals [09/20/19 1447]  Enc Vitals Group     BP (!) 143/101     Pulse Rate 86     Resp 18     Temp 97.9 F (36.6 C)     Temp Source Oral     SpO2 97 %     Weight 275 lb (124.7 kg)  Height 5\' 7"  (1.702 m)     Head Circumference      Peak Flow      Pain Score 3     Pain Loc      Pain Edu?      Excl. in [place]?      Constitutional: Alert and oriented. Well appearing and in no acute distress. Eyes: Conjunctivae are normal. PERRL. EOMI. Head: Atraumatic. Cardiovascular: Normal rate, regular rhythm. Normal S1 and S2.  Good peripheral circulation. Respiratory: Normal respiratory effort without tachypnea or retractions. Lungs CTAB. Good air entry to the bases with no decreased or absent breath sounds Gastrointestinal: Bowel sounds x 4 quadrants. Soft and nontender to palpation. No guarding or rigidity. No distention.  Patient has a 3 cm x 3 cm region of erythema along right lower quadrant of abdomen with palpable induration and fluctuance. Musculoskeletal: Full range of motion to all extremities. No obvious deformities noted Neurologic:  Normal for age. No gross focal neurologic deficits are appreciated.  Skin:  Skin is warm, dry and intact. No rash noted. Psychiatric: Mood and affect are normal for age. Speech and behavior are normal.   ____________________________________________   LABS (all labs ordered are listed, but only abnormal results are displayed)  Labs Reviewed - No data to  display ____________________________________________  EKG   ____________________________________________  RADIOLOGY    No results found.  ____________________________________________    PROCEDURES  Procedure(s) performed:     Marland KitchenMarland KitchenIncision and Drainage  Date/Time: 09/20/2019 5:50 PM Performed by: Lannie Fields, PA-C Authorized by: Lannie Fields, PA-C   Consent:    Consent obtained:  Verbal   Consent given by:  Patient   Risks discussed:  Bleeding, incomplete drainage, pain and damage to other organs   Alternatives discussed:  No treatment Universal protocol:    Procedure explained and questions answered to patient or proxy's satisfaction: yes     Relevant documents present and verified: yes     Test results available and properly labeled: yes     Imaging studies available: yes     Required blood products, implants, devices, and special equipment available: yes     Site/side marked: yes     Immediately prior to procedure a time out was called: yes     Patient identity confirmed:  Verbally with patient Location:    Type:  Abscess Pre-procedure details:    Skin preparation:  Betadine Anesthesia (see MAR for exact dosages):    Anesthesia method:  Local infiltration   Local anesthetic:  Lidocaine 1% WITH epi Procedure type:    Complexity:  Complex Procedure details:    Incision types:  Single straight   Incision depth:  Subcutaneous   Scalpel blade:  11   Wound management:  Probed and deloculated, irrigated with saline and extensive cleaning   Drainage:  Purulent   Drainage amount:  Moderate   Packing materials:  1/4 in gauze Post-procedure details:    Patient tolerance of procedure:  Tolerated well, no immediate complications       Medications  lidocaine (XYLOCAINE) 1 % (with pres) injection 5 mL (5 mLs Infiltration Given by Other 09/20/19 1600)  clindamycin (CLEOCIN) injection 600 mg (600 mg Intramuscular Given 09/20/19 1645)      ____________________________________________   INITIAL IMPRESSION / ASSESSMENT AND PLAN / ED COURSE  Pertinent labs & imaging results that were available during my care of the patient were reviewed by me and considered in my medical decision making (see chart for details).  Assessment and Plan:  Abscess:  Abdominal wall abscess 64 year old male presents to the emergency department with a 3 cm x 3 cm right lower quadrant abdominal wall abscess.  Patient was hypertensive at triage but vital signs were otherwise reassuring.  Patient had a 3 cm x 3 cm region of erythema with associated induration.  Bedside ultrasound was used during this emergency department encounter which revealed a drainable fluid collection.  Patient underwent incision and drainage in the emergency department without complication and abscess was packed.  Patient was given IM clindamycin in the emergency department as he reported that there was going to be a delay in his ability to pick up antibiotics from his pharmacy due to financial reasons.  He was discharged with clindamycin.  He was advised to remove packing in 2 days.  Strict return precautions were given to return to the emergency department with new or worsening symptoms.  All patient questions were answered.    ____________________________________________  FINAL CLINICAL IMPRESSION(S) / ED DIAGNOSES  Final diagnoses:  Abdominal wall abscess      NEW MEDICATIONS STARTED DURING THIS VISIT:  ED Discharge Orders         Ordered    clindamycin (CLEOCIN) 300 MG capsule  3 times daily     09/20/19 1658              This chart was dictated using voice recognition software/Dragon. Despite best efforts to proofread, errors can occur which can change the meaning. Any change was purely unintentional.     Orvil Feil, PA-C 09/20/19 1753    Miguel Aschoff., MD 09/21/19 [phone]

## 2019-09-20 NOTE — Discharge Instructions (Signed)
Remove packing in two days.

## 2019-12-21 ENCOUNTER — Ambulatory Visit: Payer: Medicaid Other | Admitting: Podiatry

## 2019-12-21 ENCOUNTER — Other Ambulatory Visit: Payer: Self-pay

## 2019-12-21 ENCOUNTER — Encounter: Payer: Self-pay | Admitting: Podiatry

## 2019-12-21 DIAGNOSIS — E0842 Diabetes mellitus due to underlying condition with diabetic polyneuropathy: Secondary | ICD-10-CM | POA: Diagnosis not present

## 2019-12-21 DIAGNOSIS — B351 Tinea unguium: Secondary | ICD-10-CM

## 2019-12-21 DIAGNOSIS — M79676 Pain in unspecified toe(s): Secondary | ICD-10-CM | POA: Diagnosis not present

## 2019-12-21 NOTE — Progress Notes (Signed)
This patient returns to my office for at risk foot care.  This patient requires this care by a professional since this patient will be at risk due to having  Diabetes.  This patient is unable to cut nails himself  since the patient cannot reach his  nails.These nails are painful walking and wearing shoes.  This patient presents for at risk foot care today.  General Appearance  Alert, conversant and in no acute stress.  Vascular  Dorsalis pedis and posterior tibial  pulses are palpable  bilaterally.  Capillary return is within normal limits  bilaterally. Temperature is within normal limits  bilaterally.  Neurologic  Senn-Weinstein monofilament wire test within normal limits  bilaterally. Muscle power within normal limits bilaterally.  Nails Thick disfigured discolored nails with subungual debris  from hallux to fifth toes bilaterally. No evidence of bacterial infection or drainage bilaterally.  Orthopedic  No limitations of motion  feet .  No crepitus or effusions noted.  No bony pathology or digital deformities noted.  Skin  normotropic skin with no porokeratosis noted bilaterally.  No signs of infections or ulcers noted.     Onychomycosis  Pain in right toes  Pain in left toes  Consent was obtained for treatment procedures.   Mechanical debridement of nails 1-5  bilaterally performed with a nail nipper.  Filed with dremel without incident. No infection or ulcer.     Return office visit   3 months        Told patient to return for periodic foot care and evaluation due to potential at risk complications.   Helane Gunther DPM

## 2020-03-24 ENCOUNTER — Ambulatory Visit: Payer: Medicaid Other | Admitting: Podiatry

## 2020-05-05 ENCOUNTER — Encounter: Payer: Self-pay | Admitting: *Deleted

## 2020-05-05 ENCOUNTER — Encounter: Payer: Medicaid Other | Attending: Internal Medicine | Admitting: *Deleted

## 2020-05-05 ENCOUNTER — Other Ambulatory Visit: Payer: Self-pay

## 2020-05-05 VITALS — BP 152/72 | Ht 67.0 in | Wt 283.3 lb

## 2020-05-05 DIAGNOSIS — E119 Type 2 diabetes mellitus without complications: Secondary | ICD-10-CM | POA: Diagnosis not present

## 2020-05-05 DIAGNOSIS — E1159 Type 2 diabetes mellitus with other circulatory complications: Secondary | ICD-10-CM

## 2020-05-05 NOTE — Patient Instructions (Addendum)
Check blood sugars 2 x day before breakfast and 2 hrs after supper every day Bring blood sugar records or meter to the next appointment  Exercise: Continue walking for   30  minutes   3  days a week and gradually increase to 30 minutes 5 days a week  Eat 3 meals day,   1-2  snacks a day Space meals 4-6 hours apart Don't skip meals Include 1 serving of protein when eating friut for a snack or eating cereal and milk for breakfast Complete 3 Day Food Record and bring to next appt  Carry fast acting glucose and a snack at all times Rotate injection sites Hold insulin pen in place for 5-10 seconds after injection  Return for appointment on:  Thursday May 19, 2020 at 1:30 pm with Pam (dieititian)

## 2020-05-05 NOTE — Progress Notes (Signed)
Diabetes Self-Management Education  Visit Type: First/Initial  Appt. Start Time: 1325 Appt. End Time: 1450  05/05/2020  [name], identified by name and date of birth, is a 64 y.o. male with a diagnosis of Diabetes: Type 2.   ASSESSMENT  Blood pressure (!) 152/72, height 5\' 7"  (1.702 m), weight 283 lb 4.8 oz (128.5 kg). Body mass index is 44.37 kg/m.   Diabetes Self-Management Education - 05/05/20 1527      Visit Information   Visit Type First/Initial      Initial Visit   Diabetes Type Type 2    Are you currently following a meal plan? Yes    What type of meal plan do you follow? "healthy food"" - pt is receiving Meals on Wheels    Are you taking your medications as prescribed? Yes    Date Diagnosed "several years"      Health Coping   How would you rate your overall health? Fair      Psychosocial Assessment   Patient Belief/Attitude about Diabetes Other (comment)   "I don't know"   Self-care barriers None    Self-management support Doctor's office    Patient Concerns Nutrition/Meal planning;Glycemic Control;Monitoring;Weight Control;Healthy Lifestyle    Special Needs None    Preferred Learning Style Auditory;Visual;Hands on    Learning Readiness Change in progress    How often do you need to have someone help you when you read instructions, pamphlets, or other written materials from your doctor or pharmacy? 1 - Never    What is the last grade level you completed in school? 10th      Pre-Education Assessment   Patient understands the diabetes disease and treatment process. Needs Review    Patient understands incorporating nutritional management into lifestyle. Needs Review    Patient undertands incorporating physical activity into lifestyle. Needs Review    Patient understands using medications safely. Needs Review    Patient understands monitoring blood glucose, interpreting and using results Needs Review    Patient understands prevention, detection, and  treatment of acute complications. Needs Instruction    Patient understands prevention, detection, and treatment of chronic complications. Needs Review    Patient understands how to develop strategies to address psychosocial issues. Needs Review    Patient understands how to develop strategies to promote health/change behavior. Needs Review      Complications   Last HgB A1C per patient/outside source 8.6 %   03/03/2020   How often do you check your blood sugar? 1-2 times/day    Fasting Blood glucose range (mg/dL) <67;61-950;932-671   Pt reports FBG's 125-145 mg/dL with recent 65 mg/dL.   Postprandial Blood glucose range (mg/dL) --   He reports checking blood sugars at bedtime and readings are over 200 mg/dL.   Number of hypoglycemic episodes per month 1    Can you tell when your blood sugar is low? Yes    What do you do if your blood sugar is low? This occurred one morning fasting and he just ate his breakfast.    Have you had a dilated eye exam in the past 12 months? Yes    Have you had a dental exam in the past 12 months? No   no teeth   Are you checking your feet? Yes    How many days per week are you checking your feet? 7      Dietary Intake   Breakfast eggs, [place] bacon or sausage, whole wheat toast; cereal and milk; sometimes meal  from Meals on Wheels (hamburger, green beans or mixed vegetables)    Snack (morning) 2 snacks/day - fruit cups, popcorn    Lunch Meals on Wheels or [employer]; salad with chicken and crackers    [employer] on Wheels - meat (chicken, beef, [place]) with fruit cups, bread, vegetables (green beans, broccoli, cauliflower, peas, corn, beets) rice, pasta    Beverage(s) water, diet juice, diet and caffeine free soda      Exercise   Exercise Type Light (walking / raking leaves)    How many days per week to you exercise? 3    How many minutes per day do you exercise? 30    Total minutes per week of exercise 90      Patient Education   Previous Diabetes  Education Yes (please comment)   had classes and was seen here in 2017 for 2 Hr Refresher Program   Disease state  Definition of diabetes, type 1 and 2, and the diagnosis of diabetes;Explored patient's options for treatment of their diabetes    Nutrition management  Role of diet in the treatment of diabetes and the relationship between the three main macronutrients and blood glucose level;Food label reading, portion sizes and measuring food.;Reviewed blood glucose goals for pre and post meals and how to evaluate the patients' food intake on their blood glucose level.;Meal timing in regards to the patients' current diabetes medication.    Physical activity and exercise  Role of exercise on diabetes management, blood pressure control and cardiac health.    Medications Taught/reviewed insulin injection, site rotation, insulin storage and needle disposal.;Reviewed patients medication for diabetes, action, purpose, timing of dose and side effects.    Monitoring Purpose and frequency of SMBG.;Taught/discussed recording of test results and interpretation of SMBG.;Identified appropriate SMBG and/or A1C goals.    Acute complications Taught treatment of hypoglycemia - the 15 rule.    Chronic complications Relationship between chronic complications and blood glucose control;Lipid levels, blood glucose control and heart disease    Psychosocial adjustment Role of stress on diabetes;Identified and addressed patients feelings and concerns about diabetes      Individualized Goals (developed by patient)   Reducing Risk Other (comment)   improve blood sugars, prevent diabetes complications, lose weight, lead a healthier lifestyle, become more fit     Outcomes   Expected Outcomes Demonstrated interest in learning. Expect positive outcomes    Future DMSE 2 wks           Individualized Plan for Diabetes Self-Management Training:   Learning Objective:  Patient will have a greater understanding of diabetes  self-management. Patient education plan is to attend individual and/or group sessions per assessed needs and concerns.   Plan:   Patient Instructions  Check blood sugars 2 x day before breakfast and 2 hrs after supper every day Bring blood sugar records or meter to the next appointment Exercise: Continue walking for   30  minutes   3  days a week and gradually increase to 30 minutes 5 days a week Eat 3 meals day,   1-2  snacks a day Space meals 4-6 hours apart Don't skip meals Include 1 serving of protein when eating friut for a snack or eating cereal and milk for breakfast Complete 3 Day Food Record and bring to next appt Carry fast acting glucose and a snack at all times Rotate injection sites Hold insulin pen in place for 5-10 seconds after injection Return for appointment on:  Thursday May 19, 2020  at 1:30 pm with Pam (dieititian)  Expected Outcomes:  Demonstrated interest in learning. Expect positive outcomes  Education material provided:  General Meal Planning Guidelines Simple Meal Plan 3 Day Food Record Glucose tablets Symptoms, causes and treatments of Hypoglycemia  If problems or questions, patient to contact team via:  Sharion Settler, RN, CCM, CDCES [phone]  Future DSME appointment: 2 wks  May 19, 2020 with the dietitian

## 2020-05-19 ENCOUNTER — Other Ambulatory Visit: Payer: Self-pay

## 2020-05-19 ENCOUNTER — Encounter: Payer: Self-pay | Admitting: Dietician

## 2020-05-19 ENCOUNTER — Encounter: Payer: Medicaid Other | Attending: Internal Medicine | Admitting: Dietician

## 2020-05-19 VITALS — BP 110/70 | Ht 67.0 in | Wt 281.6 lb

## 2020-05-19 DIAGNOSIS — Z794 Long term (current) use of insulin: Secondary | ICD-10-CM | POA: Diagnosis present

## 2020-05-19 DIAGNOSIS — E1159 Type 2 diabetes mellitus with other circulatory complications: Secondary | ICD-10-CM | POA: Diagnosis not present

## 2020-05-19 NOTE — Progress Notes (Signed)
Diabetes Self-Management Education  Visit Type:  Follow-up  Appt. Start Time: 1330 Appt. End Time: 1430  05/19/2020  [name], identified by name and date of birth, is a 64 y.o. male with a diagnosis of Diabetes:  .   ASSESSMENT  Blood pressure 110/70, height 5\' 7"  (1.702 m), weight 281 lb 9.6 oz (127.7 kg). Body mass index is 44.1 kg/m.    Diabetes Self-Management Education - 05/19/20 1324      Complications   How often do you check your blood sugar? 1-2 times/day    Fasting Blood glucose range (mg/dL) 13/11/21   <02;40-973;532-992; avg 102   Postprandial Blood glucose range (mg/dL) 42-683   41-962;229-798   Number of hypoglycemic episodes per month 1    Can you tell when your blood sugar is low? Yes    What do you do if your blood sugar is low? glucose tabs or candy or plain peanut butter followed by a meal    Have you had a dilated eye exam in the past 12 months? Yes    Have you had a dental exam in the past 12 months? No   no teeth, does not wear dentures   Are you checking your feet? Yes    How many days per week are you checking your feet? 7      Dietary Intake   Breakfast 2-3 meals and 2 snacks daily      Exercise   Exercise Type Light (walking / raking leaves)    How many days per week to you exercise? 3    How many minutes per day do you exercise? 30    Total minutes per week of exercise 90      Patient Education   Nutrition management  Food label reading, portion sizes and measuring food.;Meal options for control of blood glucose level and chronic complications.;Other (comment)   reviewed basic meal planning and low sugar/fat food options   Physical activity and exercise  Role of exercise on diabetes management, blood pressure control and cardiac health.;Helped patient identify appropriate exercises in relation to his/her diabetes, diabetes complications and other health issue.    Monitoring Taught/discussed recording of test results and interpretation of  SMBG.    Acute complications Taught treatment of hypoglycemia - the 15 rule.      Post-Education Assessment   Patient understands the diabetes disease and treatment process. Demonstrates understanding / competency    Patient understands incorporating nutritional management into lifestyle. Demonstrates understanding / competency    Patient undertands incorporating physical activity into lifestyle. Demonstrates understanding / competency    Patient understands using medications safely. Demonstrates understanding / competency    Patient understands monitoring blood glucose, interpreting and using results Demonstrates understanding / competency    Patient understands prevention, detection, and treatment of acute complications. Demonstrates understanding / competency    Patient understands prevention, detection, and treatment of chronic complications. Demonstrates understanding / competency    Patient understands how to develop strategies to address psychosocial issues. Demonstrates understanding / competency    Patient understands how to develop strategies to promote health/change behavior. Demonstrates understanding / competency      Outcomes   Program Status Completed           Learning Objective:  Patient will have a greater understanding of diabetes self-management. Patient education plan is to attend individual and/or group sessions per assessed needs and concerns.  Notes:    Patient receives Meals on Wheels; he is provided 5  plates each week, ready to reheat. Meals are made at [employer].   He does know some meals are higher in carbohydrate and will avoid eating some foods and save for later as appropriate.   He has been mixing fruit pie filling with yogurt for snacks; discussed the high level of sugar in the filling and advised switching to frozen fruits instead.  Plan:   Patient Instructions   Switch from eating pie filling with yogurt to frozen fruits, they will  have a lot less sugar. You can add some splenda or other sugar free sweetener if needed.  If you eat any meat that has breading on it, count the crust as a carb serving for the meal. Best to eat grilled or baked meats without any flouring or breading.  Limit chicken livers to once a week or less, to keep cholesterol down.  When eating at a restaurant, plan to take about 1/2 the meal home for leftovers.   Keep up the regular exercise. On days you don't walk, find other ways to move and be active, like doing some stretches a few times a day.   Great job making some healthy food choices, keep up the awesome job!    Expected Outcomes:  Demonstrated interest in learning. Expect positive outcomes  Education material provided: Sample menus; Smart Snacking; visit summary with goals/ instructions as listed above.  If problems or questions, patient to contact team via:  Phone and Email

## 2020-05-19 NOTE — Patient Instructions (Signed)
   Switch from eating pie filling with yogurt to frozen fruits, they will have a lot less sugar. You can add some splenda or other sugar free sweetener if needed.  If you eat any meat that has breading on it, count the crust as a carb serving for the meal. Best to eat grilled or baked meats without any flouring or breading.  Limit chicken livers to once a week or less, to keep cholesterol down.  When eating at a restaurant, plan to take about 1/2 the meal home for leftovers.   Keep up the regular exercise. On days you don't walk, find other ways to move and be active, like doing some stretches a few times a day.   Great job making some healthy food choices, keep up the awesome job!

## 2020-07-13 ENCOUNTER — Emergency Department
Admission: EM | Admit: 2020-07-13 | Discharge: 2020-07-13 | Disposition: A | Discharge status: Home / Self Care | Payer: Medicaid Other | Attending: Emergency Medicine | Admitting: Emergency Medicine

## 2020-07-13 ENCOUNTER — Other Ambulatory Visit: Payer: Self-pay

## 2020-07-13 ENCOUNTER — Emergency Department: Payer: Medicaid Other

## 2020-07-13 DIAGNOSIS — R079 Chest pain, unspecified: Secondary | ICD-10-CM | POA: Diagnosis present

## 2020-07-13 DIAGNOSIS — Z5321 Procedure and treatment not carried out due to patient leaving prior to being seen by health care provider: Secondary | ICD-10-CM | POA: Diagnosis not present

## 2020-07-13 LAB — BASIC METABOLIC PANEL
Anion gap: 9 (ref 5–15)
BUN: 21 mg/dL (ref 8–23)
CO2: 27 mmol/L (ref 22–32)
Calcium: 8.9 mg/dL (ref 8.9–10.3)
Chloride: 104 mmol/L (ref 98–111)
Creatinine, Ser: 1.34 mg/dL — ABNORMAL HIGH (ref 0.61–1.24)
GFR, Estimated: 59 mL/min — ABNORMAL LOW (ref >60)
Glucose, Bld: 118 mg/dL — ABNORMAL HIGH (ref 70–99)
Potassium: 4.2 mmol/L (ref 3.5–5.1)
Sodium: 140 mmol/L (ref 135–145)

## 2020-07-13 LAB — CBC
HCT: 43.9 % (ref 39.0–52.0)
Hemoglobin: 14 g/dL (ref 13.0–17.0)
MCH: 29.2 pg (ref 26.0–34.0)
MCHC: 31.9 g/dL (ref 30.0–36.0)
MCV: 91.6 fL (ref 80.0–100.0)
Platelets: 235 10*3/uL (ref 150–400)
RBC: 4.79 MIL/uL (ref 4.22–5.81)
RDW: 13.2 % (ref 11.5–15.5)
WBC: 5.6 10*3/uL (ref 4.0–10.5)
nRBC: 0 % (ref 0.0–0.2)

## 2020-07-13 LAB — TROPONIN I (HIGH SENSITIVITY)
Troponin I (High Sensitivity): 6 ng/L (ref <18)
Troponin I (High Sensitivity): 7 ng/L (ref <18)

## 2020-07-13 NOTE — ED Notes (Signed)
Pt refusing to stay at this time.  RN explained risks of patient leaving.  Pt states he has an appt with his PCP later this week.

## 2020-07-13 NOTE — ED Triage Notes (Signed)
Pt to ED via EMS with c/o CP. Pt states pain middle of his chest that radiates to L chest. Pt states "I was told the R side of my chest is not working the way it's supposed to be".

## 2020-07-13 NOTE — ED Notes (Signed)
First RN note:  Pt comes into the ED via [facility] c/o left CP that radiates into the left arm.  Pt has had h/o MI in 2012.  324 ASA given.  154/88, 80 HR, 98 %, 135 CBG. Denies any Nausea, SHOb, or dizziness.  EKG unremarkable at [facility].

## 2020-08-29 ENCOUNTER — Telehealth: Payer: Self-pay | Admitting: Dietician

## 2020-08-29 NOTE — Telephone Encounter (Signed)
Patient called with nutrition questions. He is planning to try meal replacement shake at least once a day to help with weight loss. Discussed using shakes that are at least reasonably low in carbohydrate ie 20-25g or ideally less, and 10g or more protein, and best options have several grams fiber. He is eating egg beaters or egg whites,unsure what to use to cook the eggs/ substitute -- advised liquid vegetable oils, cooking spray, or soft margarine. He reports increasing daily walking but does sometimes have pain; discussed walking for short periods of time, several times a day, gradually increasing duration and/or frequency as strength/ stamina increase.

## 2021-01-10 DIAGNOSIS — E039 Hypothyroidism, unspecified: Secondary | ICD-10-CM | POA: Diagnosis not present

## 2021-01-10 DIAGNOSIS — E119 Type 2 diabetes mellitus without complications: Secondary | ICD-10-CM | POA: Diagnosis not present

## 2021-01-12 DIAGNOSIS — Z794 Long term (current) use of insulin: Secondary | ICD-10-CM | POA: Diagnosis not present

## 2021-01-12 DIAGNOSIS — I10 Essential (primary) hypertension: Secondary | ICD-10-CM | POA: Diagnosis not present

## 2021-01-12 DIAGNOSIS — E063 Autoimmune thyroiditis: Secondary | ICD-10-CM | POA: Diagnosis not present

## 2021-01-12 DIAGNOSIS — E1165 Type 2 diabetes mellitus with hyperglycemia: Secondary | ICD-10-CM | POA: Diagnosis not present

## 2021-01-12 DIAGNOSIS — E782 Mixed hyperlipidemia: Secondary | ICD-10-CM | POA: Diagnosis not present

## 2021-01-13 DIAGNOSIS — I10 Essential (primary) hypertension: Secondary | ICD-10-CM | POA: Diagnosis not present

## 2021-01-13 DIAGNOSIS — E039 Hypothyroidism, unspecified: Secondary | ICD-10-CM | POA: Diagnosis not present

## 2021-01-13 DIAGNOSIS — M109 Gout, unspecified: Secondary | ICD-10-CM | POA: Diagnosis not present

## 2021-01-13 DIAGNOSIS — G459 Transient cerebral ischemic attack, unspecified: Secondary | ICD-10-CM | POA: Diagnosis not present

## 2021-01-13 DIAGNOSIS — E669 Obesity, unspecified: Secondary | ICD-10-CM | POA: Diagnosis not present

## 2021-01-13 DIAGNOSIS — I509 Heart failure, unspecified: Secondary | ICD-10-CM | POA: Diagnosis not present

## 2021-01-13 DIAGNOSIS — Z9119 Patient's noncompliance with other medical treatment and regimen: Secondary | ICD-10-CM | POA: Diagnosis not present

## 2021-01-13 DIAGNOSIS — N189 Chronic kidney disease, unspecified: Secondary | ICD-10-CM | POA: Diagnosis not present

## 2021-01-13 DIAGNOSIS — E119 Type 2 diabetes mellitus without complications: Secondary | ICD-10-CM | POA: Diagnosis not present

## 2021-01-24 ENCOUNTER — Other Ambulatory Visit (INDEPENDENT_AMBULATORY_CARE_PROVIDER_SITE_OTHER): Payer: Self-pay | Admitting: Nurse Practitioner

## 2021-01-24 DIAGNOSIS — I890 Lymphedema, not elsewhere classified: Secondary | ICD-10-CM

## 2021-01-24 DIAGNOSIS — I739 Peripheral vascular disease, unspecified: Secondary | ICD-10-CM

## 2021-01-25 ENCOUNTER — Ambulatory Visit (INDEPENDENT_AMBULATORY_CARE_PROVIDER_SITE_OTHER): Payer: Medicare HMO

## 2021-01-25 ENCOUNTER — Encounter (INDEPENDENT_AMBULATORY_CARE_PROVIDER_SITE_OTHER): Payer: Self-pay | Admitting: Nurse Practitioner

## 2021-01-25 ENCOUNTER — Ambulatory Visit (INDEPENDENT_AMBULATORY_CARE_PROVIDER_SITE_OTHER): Payer: Medicare HMO | Admitting: Nurse Practitioner

## 2021-01-25 ENCOUNTER — Other Ambulatory Visit: Payer: Self-pay

## 2021-01-25 VITALS — BP 129/82 | HR 182 | Ht 67.0 in | Wt 290.0 lb

## 2021-01-25 DIAGNOSIS — I10 Essential (primary) hypertension: Secondary | ICD-10-CM

## 2021-01-25 DIAGNOSIS — I890 Lymphedema, not elsewhere classified: Secondary | ICD-10-CM

## 2021-01-25 DIAGNOSIS — I739 Peripheral vascular disease, unspecified: Secondary | ICD-10-CM | POA: Diagnosis not present

## 2021-01-25 DIAGNOSIS — E119 Type 2 diabetes mellitus without complications: Secondary | ICD-10-CM | POA: Diagnosis not present

## 2021-01-25 NOTE — Progress Notes (Signed)
Subjective:    Patient ID: Derrick Barajas, male    DOB: [birth year], 65 y.o.   MRN: [id number] Chief Complaint  Patient presents with   New Patient (Initial Visit)    Dr. Excell Seltzer andrew. Dx. Type 2 diabetes. Perip vascu disease lymphedema     Patient is seen for evaluation of leg pain and leg swelling. The patient first noticed the swelling remotely. The swelling is associated with pain and discoloration. The pain and swelling worsens with prolonged dependency and improves with elevation. The pain is unrelated to activity.  The patient notes that in the morning the legs are significantly improved but they steadily worsened throughout the course of the day. The patient also notes a steady worsening of the discoloration in the ankle and shin area.   The patient denies claudication symptoms.  The patient denies symptoms consistent with rest pain.  The patient denies and extensive history of DJD and LS spine disease.  The patient has no had any past angiography, interventions or vascular surgery.  Elevation makes the leg symptoms better, dependency makes them much worse. There is no history of ulcerations. The patient denies any recent changes in medications.  The patient has not been wearing graduated compression.  The patient denies a history of DVT or PE. There is no prior history of phlebitis. There is no history of primary lymphedema.  No history of malignancies. No history of trauma or groin or pelvic surgery. There is no history of radiation treatment to the groin or pelvis  The patient denies amaurosis fugax or recent TIA symptoms. There are no recent neurological changes noted. The patient denies recent episodes of angina or shortness of breath   The patient also had ABIs done today.  The right is 1.09 with a left being 1.07.  The patient had triphasic tibial artery waveforms bilaterally with good toe waveforms bilaterally.   Review of Systems  Cardiovascular:  Positive for leg  swelling.  All other systems reviewed and are negative.     Objective:   Physical Exam Vitals reviewed.  HENT:     Head: Normocephalic.  Cardiovascular:     Rate and Rhythm: Normal rate.     Pulses: Normal pulses.  Pulmonary:     Effort: Pulmonary effort is normal.  Musculoskeletal:     Right lower leg: 1+ Edema present.     Left lower leg: 1+ Edema present.  Neurological:     Mental Status: He is alert and oriented to person, place, and time.  Psychiatric:        Mood and Affect: Mood normal.        Behavior: Behavior normal.        Thought Content: Thought content normal.        Judgment: Judgment normal.    BP 129/82   Pulse (!) 182   Ht 5\' 7"  (1.702 m)   Wt 290 lb (131.5 kg)   BMI 45.42 kg/m   Past Medical History:  Diagnosis Date   CAD (coronary artery disease)    CHF (congestive heart failure) (HCC)    Diabetes mellitus without complication (HCC)    Gout    Hyperlipidemia    Hypertension    MI (myocardial infarction) (HCC)    RA (rheumatoid arthritis) (HCC)    Sleep apnea    Stroke (HCC)    left facal droop   Thyroid disease     Social History   Socioeconomic History   Marital status: Legally Separated  Spouse name: Not on file   Number of children: Not on file   Years of education: Not on file   Highest education level: Not on file  Occupational History   Not on file  Tobacco Use   Smoking status: Never   Smokeless tobacco: Never  Vaping Use   Vaping Use: Never used  Substance and Sexual Activity   Alcohol use: No    Alcohol/week: 0.0 standard drinks   Drug use: No   Sexual activity: Never    Partners: Female    Birth control/protection: None  Other Topics Concern   Not on file  Social History Narrative   Not on file   Social Determinants of Health   Financial Resource Strain: Not on file  Food Insecurity: Not on file  Transportation Needs: Not on file  Physical Activity: Not on file  Stress: Not on file  Social Connections:  Not on file  Intimate Partner Violence: Not on file    Past Surgical History:  Procedure Laterality Date   CIRCUMCISION     CORONARY ANGIOPLASTY WITH STENT PLACEMENT     CORONARY STENT INTERVENTION N/A 11/17/2018   Procedure: CORONARY STENT INTERVENTION;  Surgeon: Alwyn Pea, MD;  Location: [facility];  Service: Cardiovascular;  Laterality: N/A;   LEFT HEART CATH AND CORONARY ANGIOGRAPHY Left 11/17/2018   Procedure: LEFT HEART CATH AND CORONARY ANGIOGRAPHY;  Surgeon: Laurier Nancy, MD;  Location: [facility];  Service: Cardiovascular;  Laterality: Left;   TONSILLECTOMY      Family History  Problem Relation Age of Onset   Diabetes Mother    Diabetes Brother     Allergies  Allergen Reactions   Shellfish-Derived Products Other (See Comments)    Other reaction(s): Nausea/Vomiting/Diarrhea, Shock/Unconsciousness   Sulfa Antibiotics Other (See Comments)    Other reaction(s): Nausea/Vomiting/Diarrhea, Shock/Unconsciousness   Iodine Nausea And Vomiting   Atorvastatin Other (See Comments)   Iodinated Diagnostic Agents Other (See Comments)   Nsaids Other (See Comments)    D/t cardiac meds   Shrimp [Shellfish Allergy] Nausea And Vomiting    CBC Latest Ref Rng & Units 07/13/2020 09/11/2018 02/04/2017  WBC 4.0 - 10.5 K/uL 5.6 6.2 5.6  Hemoglobin 13.0 - 17.0 g/dL 26.3 33.5 45.6  Hematocrit 39.0 - 52.0 % 43.9 44.1 42.4  Platelets 150 - 400 K/uL 235 231 203      CMP     Component Value Date/Time   NA 140 07/13/2020 1450   NA 140 05/03/2014 1044   K 4.2 07/13/2020 1450   K 4.2 05/03/2014 1044   CL 104 07/13/2020 1450   CL 107 05/03/2014 1044   CO2 27 07/13/2020 1450   CO2 27 05/03/2014 1044   GLUCOSE 118 (H) 07/13/2020 1450   GLUCOSE 155 (H) 05/03/2014 1044   BUN 21 07/13/2020 1450   BUN 13 05/03/2014 1044   CREATININE 1.34 (H) 07/13/2020 1450   CREATININE 1.15 05/03/2014 1044   CALCIUM 8.9 07/13/2020 1450   CALCIUM 8.4 (L) 05/03/2014 1044   PROT  6.6 02/04/2017 1223   PROT 7.3 05/03/2014 1044   ALBUMIN 3.7 02/04/2017 1223   ALBUMIN 3.4 05/03/2014 1044   AST 25 02/04/2017 1223   AST 27 05/03/2014 1044   ALT 16 (L) 02/04/2017 1223   ALT 33 05/03/2014 1044   ALKPHOS 62 02/04/2017 1223   ALKPHOS 93 05/03/2014 1044   BILITOT 0.4 02/04/2017 1223   BILITOT 0.2 05/03/2014 1044   GFRNONAA 59 (L) 07/13/2020  1450   GFRNONAA >60 05/03/2014 1044   GFRNONAA >60 09/23/2012 1007   GFRAA >60 11/18/2018 0352   GFRAA >60 05/03/2014 1044   GFRAA >60 09/23/2012 1007     No results found.     Assessment & Plan:   1. PVD (peripheral vascular disease) (HCC) Recommend:  I do not find evidence of life style limiting vascular disease. The patient specifically denies life style limitation.  Previous noninvasive studies including ABI's of the legs do not identify critical vascular problems.  The patient should continue walking and begin a more formal exercise program. The patient should continue his antiplatelet therapy and aggressive treatment of the lipid abnormalities.  The patient should begin wearing graduated compression socks 15-20 mmHg strength to control his mild edema.   2. Lymphedema I have had a long discussion with the patient regarding swelling and why it  causes symptoms.  Patient will begin wearing graduated compression stockings class 1 (20-30 mmHg) on a daily basis a prescription was given. The patient will  beginning wearing the stockings first thing in the morning and removing them in the evening. The patient is instructed specifically not to sleep in the stockings.   In addition, behavioral modification will be initiated.  This will include frequent elevation, use of over the counter pain medications and exercise such as walking.  I have reviewed systemic causes for chronic edema such as liver, kidney and cardiac etiologies.  The patient denies problems with these organ systems.    Consideration for a lymph pump will  also be made based upon the effectiveness of conservative therapy.  This would help to improve the edema control and prevent sequela such as ulcers and infections   Patient should undergo duplex ultrasound of the venous system to ensure that DVT or reflux is not present.  The patient will follow-up with me after the ultrasound.    3. Hypertension, unspecified type Continue antihypertensive medications as already ordered, these medications have been reviewed and there are no changes at this time.   4. Type 2 diabetes mellitus without complication, unspecified whether long term insulin use (HCC) Continue hypoglycemic medications as already ordered, these medications have been reviewed and there are no changes at this time.  Hgb A1C to be monitored as already arranged by primary service    Current Outpatient Medications on File Prior to Visit  Medication Sig Dispense Refill   amLODipine (NORVASC) 10 MG tablet Take 10 mg by mouth daily.      aspirin EC 81 MG tablet Take 81 mg by mouth daily.     docusate sodium (COLACE) 100 MG capsule Take 100 mg at bedtime by mouth.      ezetimibe (ZETIA) 10 MG tablet Take 10 mg by mouth daily.      furosemide (LASIX) 20 MG tablet Take 20 mg by mouth 2 (two) times daily.      insulin degludec (TRESIBA) 200 UNIT/ML FlexTouch Pen Inject 100 Units into the skin in the morning and at bedtime.      isosorbide mononitrate (IMDUR) 60 MG 24 hr tablet Take 60 mg daily by mouth.     JARDIANCE 25 MG TABS tablet Take 25 mg by mouth every morning.     levothyroxine (SYNTHROID, LEVOTHROID) 125 MCG tablet Take 125 mcg by mouth daily before breakfast.   2   lisinopril (ZESTRIL) 40 MG tablet Take 40 mg by mouth daily.      metoprolol succinate (TOPROL-XL) 100 MG 24 hr tablet Take 100  mg daily by mouth. Take with or immediately following a meal.     nitroGLYCERIN (NITROSTAT) 0.4 MG SL tablet Place 0.4 mg under the tongue every 5 (five) minutes as needed for chest pain.      prasugrel (EFFIENT) 10 MG TABS Take 10 mg by mouth daily at 3 pm.      ranolazine (RANEXA) 500 MG 12 hr tablet Take 500 mg by mouth 2 (two) times daily.     rosuvastatin (CRESTOR) 20 MG tablet TK 1 T PO QD     ACCU-CHEK AVIVA PLUS test strip TEST TID (Patient not taking: Reported on 01/25/2021)  11   B-D ULTRAFINE III SHORT PEN 31G X 8 MM MISC USE UTD BID (Patient not taking: Reported on 01/25/2021)  3   fluticasone (FLONASE) 50 MCG/ACT nasal spray Place 1 spray into both nostrils daily.  (Patient not taking: Reported on 01/25/2021)  1   olopatadine (PATANOL) 0.1 % ophthalmic solution Place 1 drop into both eyes daily. (Patient not taking: Reported on 01/25/2021)     ramipril (ALTACE) 10 MG capsule Take 10 mg by mouth daily.  (Patient not taking: Reported on 01/25/2021)  2   spironolactone (ALDACTONE) 25 MG tablet Take 25 mg by mouth daily. (Patient not taking: Reported on 01/25/2021)     TRULICITY 1.5 MG/0.5ML SOPN Inject 1.5 mg into the skin every Wednesday.  (Patient not taking: Reported on 01/25/2021)  2   No current facility-administered medications on file prior to visit.    There are no Patient Instructions on file for this visit. No follow-ups on file.   Georgiana Spinner, NP

## 2021-02-27 DIAGNOSIS — Z9119 Patient's noncompliance with other medical treatment and regimen: Secondary | ICD-10-CM | POA: Diagnosis not present

## 2021-02-27 DIAGNOSIS — E669 Obesity, unspecified: Secondary | ICD-10-CM | POA: Diagnosis not present

## 2021-02-27 DIAGNOSIS — E119 Type 2 diabetes mellitus without complications: Secondary | ICD-10-CM | POA: Diagnosis not present

## 2021-02-27 DIAGNOSIS — M109 Gout, unspecified: Secondary | ICD-10-CM | POA: Diagnosis not present

## 2021-02-27 DIAGNOSIS — E039 Hypothyroidism, unspecified: Secondary | ICD-10-CM | POA: Diagnosis not present

## 2021-02-27 DIAGNOSIS — I10 Essential (primary) hypertension: Secondary | ICD-10-CM | POA: Diagnosis not present

## 2021-02-27 DIAGNOSIS — G459 Transient cerebral ischemic attack, unspecified: Secondary | ICD-10-CM | POA: Diagnosis not present

## 2021-02-27 DIAGNOSIS — I509 Heart failure, unspecified: Secondary | ICD-10-CM | POA: Diagnosis not present

## 2021-02-27 DIAGNOSIS — N189 Chronic kidney disease, unspecified: Secondary | ICD-10-CM | POA: Diagnosis not present

## 2021-03-21 DIAGNOSIS — J45998 Other asthma: Secondary | ICD-10-CM | POA: Diagnosis not present

## 2021-03-21 DIAGNOSIS — I10 Essential (primary) hypertension: Secondary | ICD-10-CM | POA: Diagnosis not present

## 2021-03-21 DIAGNOSIS — E119 Type 2 diabetes mellitus without complications: Secondary | ICD-10-CM | POA: Diagnosis not present

## 2021-03-21 DIAGNOSIS — I340 Nonrheumatic mitral (valve) insufficiency: Secondary | ICD-10-CM | POA: Diagnosis not present

## 2021-03-21 DIAGNOSIS — I351 Nonrheumatic aortic (valve) insufficiency: Secondary | ICD-10-CM | POA: Diagnosis not present

## 2021-03-21 DIAGNOSIS — I2510 Atherosclerotic heart disease of native coronary artery without angina pectoris: Secondary | ICD-10-CM | POA: Diagnosis not present

## 2021-03-28 DIAGNOSIS — I10 Essential (primary) hypertension: Secondary | ICD-10-CM | POA: Diagnosis not present

## 2021-03-31 DIAGNOSIS — I2510 Atherosclerotic heart disease of native coronary artery without angina pectoris: Secondary | ICD-10-CM | POA: Diagnosis not present

## 2021-04-03 DIAGNOSIS — E113293 Type 2 diabetes mellitus with mild nonproliferative diabetic retinopathy without macular edema, bilateral: Secondary | ICD-10-CM | POA: Diagnosis not present

## 2021-04-03 DIAGNOSIS — Z0100 Encounter for examination of eyes and vision without abnormal findings: Secondary | ICD-10-CM | POA: Diagnosis not present

## 2021-04-03 DIAGNOSIS — J45998 Other asthma: Secondary | ICD-10-CM | POA: Diagnosis not present

## 2021-04-03 DIAGNOSIS — E119 Type 2 diabetes mellitus without complications: Secondary | ICD-10-CM | POA: Diagnosis not present

## 2021-04-03 DIAGNOSIS — I219 Acute myocardial infarction, unspecified: Secondary | ICD-10-CM | POA: Diagnosis not present

## 2021-04-03 DIAGNOSIS — I351 Nonrheumatic aortic (valve) insufficiency: Secondary | ICD-10-CM | POA: Diagnosis not present

## 2021-04-03 DIAGNOSIS — H524 Presbyopia: Secondary | ICD-10-CM | POA: Diagnosis not present

## 2021-04-03 DIAGNOSIS — E669 Obesity, unspecified: Secondary | ICD-10-CM | POA: Diagnosis not present

## 2021-04-03 DIAGNOSIS — I2510 Atherosclerotic heart disease of native coronary artery without angina pectoris: Secondary | ICD-10-CM | POA: Diagnosis not present

## 2021-04-03 DIAGNOSIS — E782 Mixed hyperlipidemia: Secondary | ICD-10-CM | POA: Diagnosis not present

## 2021-04-03 DIAGNOSIS — I340 Nonrheumatic mitral (valve) insufficiency: Secondary | ICD-10-CM | POA: Diagnosis not present

## 2021-04-03 DIAGNOSIS — I10 Essential (primary) hypertension: Secondary | ICD-10-CM | POA: Diagnosis not present

## 2021-04-10 DIAGNOSIS — N39498 Other specified urinary incontinence: Secondary | ICD-10-CM | POA: Diagnosis not present

## 2021-04-10 DIAGNOSIS — M109 Gout, unspecified: Secondary | ICD-10-CM | POA: Diagnosis not present

## 2021-04-10 DIAGNOSIS — N189 Chronic kidney disease, unspecified: Secondary | ICD-10-CM | POA: Diagnosis not present

## 2021-04-10 DIAGNOSIS — E119 Type 2 diabetes mellitus without complications: Secondary | ICD-10-CM | POA: Diagnosis not present

## 2021-04-10 DIAGNOSIS — E669 Obesity, unspecified: Secondary | ICD-10-CM | POA: Diagnosis not present

## 2021-04-10 DIAGNOSIS — G459 Transient cerebral ischemic attack, unspecified: Secondary | ICD-10-CM | POA: Diagnosis not present

## 2021-04-10 DIAGNOSIS — I509 Heart failure, unspecified: Secondary | ICD-10-CM | POA: Diagnosis not present

## 2021-04-10 DIAGNOSIS — E039 Hypothyroidism, unspecified: Secondary | ICD-10-CM | POA: Diagnosis not present

## 2021-04-10 DIAGNOSIS — I10 Essential (primary) hypertension: Secondary | ICD-10-CM | POA: Diagnosis not present

## 2021-04-19 DIAGNOSIS — I890 Lymphedema, not elsewhere classified: Secondary | ICD-10-CM | POA: Diagnosis not present

## 2021-04-19 DIAGNOSIS — I509 Heart failure, unspecified: Secondary | ICD-10-CM | POA: Diagnosis not present

## 2021-04-19 DIAGNOSIS — Z794 Long term (current) use of insulin: Secondary | ICD-10-CM | POA: Diagnosis not present

## 2021-04-19 DIAGNOSIS — N39498 Other specified urinary incontinence: Secondary | ICD-10-CM | POA: Diagnosis not present

## 2021-04-19 DIAGNOSIS — E039 Hypothyroidism, unspecified: Secondary | ICD-10-CM | POA: Diagnosis not present

## 2021-04-19 DIAGNOSIS — E1165 Type 2 diabetes mellitus with hyperglycemia: Secondary | ICD-10-CM | POA: Diagnosis not present

## 2021-04-19 DIAGNOSIS — N189 Chronic kidney disease, unspecified: Secondary | ICD-10-CM | POA: Diagnosis not present

## 2021-04-19 DIAGNOSIS — E1159 Type 2 diabetes mellitus with other circulatory complications: Secondary | ICD-10-CM | POA: Diagnosis not present

## 2021-04-19 DIAGNOSIS — E1169 Type 2 diabetes mellitus with other specified complication: Secondary | ICD-10-CM | POA: Diagnosis not present

## 2021-04-19 DIAGNOSIS — I739 Peripheral vascular disease, unspecified: Secondary | ICD-10-CM | POA: Diagnosis not present

## 2021-04-19 DIAGNOSIS — E1142 Type 2 diabetes mellitus with diabetic polyneuropathy: Secondary | ICD-10-CM | POA: Diagnosis not present

## 2021-04-19 DIAGNOSIS — E669 Obesity, unspecified: Secondary | ICD-10-CM | POA: Diagnosis not present

## 2021-04-19 DIAGNOSIS — M109 Gout, unspecified: Secondary | ICD-10-CM | POA: Diagnosis not present

## 2021-04-19 DIAGNOSIS — G459 Transient cerebral ischemic attack, unspecified: Secondary | ICD-10-CM | POA: Diagnosis not present

## 2021-04-19 DIAGNOSIS — E119 Type 2 diabetes mellitus without complications: Secondary | ICD-10-CM | POA: Diagnosis not present

## 2021-04-19 DIAGNOSIS — E785 Hyperlipidemia, unspecified: Secondary | ICD-10-CM | POA: Diagnosis not present

## 2021-04-19 DIAGNOSIS — I10 Essential (primary) hypertension: Secondary | ICD-10-CM | POA: Diagnosis not present

## 2021-04-26 ENCOUNTER — Other Ambulatory Visit (INDEPENDENT_AMBULATORY_CARE_PROVIDER_SITE_OTHER): Payer: Self-pay | Admitting: Vascular Surgery

## 2021-04-26 DIAGNOSIS — I739 Peripheral vascular disease, unspecified: Secondary | ICD-10-CM

## 2021-04-27 ENCOUNTER — Encounter (INDEPENDENT_AMBULATORY_CARE_PROVIDER_SITE_OTHER): Payer: Self-pay | Admitting: Nurse Practitioner

## 2021-04-27 ENCOUNTER — Other Ambulatory Visit: Payer: Self-pay

## 2021-04-27 ENCOUNTER — Ambulatory Visit (INDEPENDENT_AMBULATORY_CARE_PROVIDER_SITE_OTHER): Payer: Medicare HMO

## 2021-04-27 ENCOUNTER — Ambulatory Visit (INDEPENDENT_AMBULATORY_CARE_PROVIDER_SITE_OTHER): Payer: Medicare HMO | Admitting: Nurse Practitioner

## 2021-04-27 VITALS — BP 152/81 | HR 81 | Resp 16 | Wt 287.0 lb

## 2021-04-27 DIAGNOSIS — I890 Lymphedema, not elsewhere classified: Secondary | ICD-10-CM | POA: Diagnosis not present

## 2021-04-27 DIAGNOSIS — E119 Type 2 diabetes mellitus without complications: Secondary | ICD-10-CM | POA: Diagnosis not present

## 2021-04-27 DIAGNOSIS — I10 Essential (primary) hypertension: Secondary | ICD-10-CM | POA: Diagnosis not present

## 2021-04-27 DIAGNOSIS — I739 Peripheral vascular disease, unspecified: Secondary | ICD-10-CM | POA: Diagnosis not present

## 2021-04-27 NOTE — Progress Notes (Signed)
Subjective:    Patient ID: Derrick Barajas, male    DOB: 12-08-1955, 65 y.o.   MRN: [id number] Chief Complaint  Patient presents with   Follow-up    Ultrasound follow up    Jeffrey Graefe is a 65 year old male that returns to the office for followup evaluation regarding leg swelling.  The swelling has improved quite a bit and the pain associated with swelling has decreased substantially. There have not been any interval development of a ulcerations or wounds.  Since the previous visit the patient has been wearing graduated compression stockings and has noted little significant improvement in the lymphedema. The patient has been using compression routinely morning until night.  The patient also states elevation during the day and exercise is being done too.   Today noninvasive studies show evidence of reflux in the bilateral common femoral veins.  No evidence of DVT or superficial thrombophlebitis bilaterally.  No evidence of superficial venous reflux bilaterally.     Review of Systems  Cardiovascular:  Positive for leg swelling.  All other systems reviewed and are negative.     Objective:   Physical Exam Vitals reviewed.  HENT:     Head: Normocephalic.  Cardiovascular:     Rate and Rhythm: Normal rate.  Pulmonary:     Effort: Pulmonary effort is normal.  Musculoskeletal:     Right lower leg: 2+ Edema present.     Left lower leg: 2+ Edema present.  Skin:    Comments: Dermal thickening bilaterally  Neurological:     Mental Status: He is alert and oriented to person, place, and time.  Psychiatric:        Mood and Affect: Mood normal.        Behavior: Behavior normal.        Thought Content: Thought content normal.        Judgment: Judgment normal.    BP (!) 152/81 (BP Location: Left Arm)   Pulse 81   Resp 16   Wt 287 lb (130.2 kg)   BMI 44.95 kg/m   Past Medical History:  Diagnosis Date   CAD (coronary artery disease)    CHF (congestive heart failure) (HCC)     Diabetes mellitus without complication (HCC)    Gout    Hyperlipidemia    Hypertension    MI (myocardial infarction) (HCC)    RA (rheumatoid arthritis) (HCC)    Sleep apnea    Stroke (HCC)    left facal droop   Thyroid disease     Social History   Socioeconomic History   Marital status: Legally Separated    Spouse name: Not on file   Number of children: Not on file   Years of education: Not on file   Highest education level: Not on file  Occupational History   Not on file  Tobacco Use   Smoking status: Never   Smokeless tobacco: Never  Vaping Use   Vaping Use: Never used  Substance and Sexual Activity   Alcohol use: No    Alcohol/week: 0.0 standard drinks   Drug use: No   Sexual activity: Never    Partners: Female    Birth control/protection: None  Other Topics Concern   Not on file  Social History Narrative   Not on file   Social Determinants of Health   Financial Resource Strain: Not on file  Food Insecurity: Not on file  Transportation Needs: Not on file  Physical Activity: Not on file  Stress:  Not on file  Social Connections: Not on file  Intimate Partner Violence: Not on file    Past Surgical History:  Procedure Laterality Date   CIRCUMCISION     CORONARY ANGIOPLASTY WITH STENT PLACEMENT     CORONARY STENT INTERVENTION N/A 11/17/2018   Procedure: CORONARY STENT INTERVENTION;  Surgeon: Alwyn Pea, MD;  Location: [facility];  Service: Cardiovascular;  Laterality: N/A;   LEFT HEART CATH AND CORONARY ANGIOGRAPHY Left 11/17/2018   Procedure: LEFT HEART CATH AND CORONARY ANGIOGRAPHY;  Surgeon: Laurier Nancy, MD;  Location: [facility];  Service: Cardiovascular;  Laterality: Left;   TONSILLECTOMY      Family History  Problem Relation Age of Onset   Diabetes Mother    Diabetes Brother     Allergies  Allergen Reactions   Shellfish-Derived Products Other (See Comments)    Other reaction(s): Nausea/Vomiting/Diarrhea,  Shock/Unconsciousness   Sulfa Antibiotics Other (See Comments)    Other reaction(s): Nausea/Vomiting/Diarrhea, Shock/Unconsciousness   Iodine Nausea And Vomiting   Atorvastatin Other (See Comments)   Iodinated Diagnostic Agents Other (See Comments)   Nsaids Other (See Comments)    D/t cardiac meds   Shrimp [Shellfish Allergy] Nausea And Vomiting    CBC Latest Ref Rng & Units 07/13/2020 09/11/2018 02/04/2017  WBC 4.0 - 10.5 K/uL 5.6 6.2 5.6  Hemoglobin 13.0 - 17.0 g/dL 95.6 38.7 56.4  Hematocrit 39.0 - 52.0 % 43.9 44.1 42.4  Platelets 150 - 400 K/uL 235 231 203      CMP     Component Value Date/Time   NA 140 07/13/2020 1450   NA 140 05/03/2014 1044   K 4.2 07/13/2020 1450   K 4.2 05/03/2014 1044   CL 104 07/13/2020 1450   CL 107 05/03/2014 1044   CO2 27 07/13/2020 1450   CO2 27 05/03/2014 1044   GLUCOSE 118 (H) 07/13/2020 1450   GLUCOSE 155 (H) 05/03/2014 1044   BUN 21 07/13/2020 1450   BUN 13 05/03/2014 1044   CREATININE 1.34 (H) 07/13/2020 1450   CREATININE 1.15 05/03/2014 1044   CALCIUM 8.9 07/13/2020 1450   CALCIUM 8.4 (L) 05/03/2014 1044   PROT 6.6 02/04/2017 1223   PROT 7.3 05/03/2014 1044   ALBUMIN 3.7 02/04/2017 1223   ALBUMIN 3.4 05/03/2014 1044   AST 25 02/04/2017 1223   AST 27 05/03/2014 1044   ALT 16 (L) 02/04/2017 1223   ALT 33 05/03/2014 1044   ALKPHOS 62 02/04/2017 1223   ALKPHOS 93 05/03/2014 1044   BILITOT 0.4 02/04/2017 1223   BILITOT 0.2 05/03/2014 1044   GFRNONAA 59 (L) 07/13/2020 1450   GFRNONAA >60 05/03/2014 1044   GFRNONAA >60 09/23/2012 1007   GFRAA >60 11/18/2018 0352   GFRAA >60 05/03/2014 1044   GFRAA >60 09/23/2012 1007     No results found.     Assessment & Plan:   1. Lymphedema Recommend:  No surgery or intervention at this point in time.    I have reviewed my previous discussion with the patient regarding swelling and why it causes symptoms.  Patient will continue wearing graduated compression stockings class 1 (20-30  mmHg) on a daily basis. The patient will  beginning wearing the stockings first thing in the morning and removing them in the evening. The patient is instructed specifically not to sleep in the stockings.    In addition, behavioral modification including several periods of elevation of the lower extremities during the day will be continued.  This  was reviewed with the patient during the initial visit.  The patient will also continue routine exercise, especially walking on a daily basis as was discussed during the initial visit.    Despite conservative treatments of at least 4 weeks, including graduated compression therapy class 1 and behavioral modification including exercise and elevation the patient  has not obtained adequate control of the lymphedema.  The patient still has stage 3 lymphedema and therefore, I believe that a lymph pump should be added to improve the control of the patient's lymphedema.  Additionally, a lymph pump is warranted because it will reduce the risk of cellulitis and ulceration in the future.  Patient should follow-up in six months    2. Hypertension, unspecified type Continue antihypertensive medications as already ordered, these medications have been reviewed and there are no changes at this time.   3. Type 2 diabetes mellitus without complication, unspecified whether long term insulin use (HCC) Continue hypoglycemic medications as already ordered, these medications have been reviewed and there are no changes at this time.  Hgb A1C to be monitored as already arranged by primary service    Current Outpatient Medications on File Prior to Visit  Medication Sig Dispense Refill   amLODipine (NORVASC) 10 MG tablet Take 10 mg by mouth daily.      aspirin EC 81 MG tablet Take 81 mg by mouth daily.     docusate sodium (COLACE) 100 MG capsule Take 100 mg at bedtime by mouth.      ezetimibe (ZETIA) 10 MG tablet Take 10 mg by mouth daily.      furosemide (LASIX) 20 MG tablet  Take 20 mg by mouth 2 (two) times daily.      insulin degludec (TRESIBA) 200 UNIT/ML FlexTouch Pen Inject 100 Units into the skin in the morning and at bedtime.      isosorbide mononitrate (IMDUR) 60 MG 24 hr tablet Take 60 mg daily by mouth.     JARDIANCE 25 MG TABS tablet Take 25 mg by mouth every morning.     levothyroxine (SYNTHROID, LEVOTHROID) 125 MCG tablet Take 125 mcg by mouth daily before breakfast.   2   lisinopril (ZESTRIL) 40 MG tablet Take 40 mg by mouth daily.      metoprolol succinate (TOPROL-XL) 100 MG 24 hr tablet Take 100 mg daily by mouth. Take with or immediately following a meal.     nitroGLYCERIN (NITROSTAT) 0.4 MG SL tablet Place 0.4 mg under the tongue every 5 (five) minutes as needed for chest pain.     prasugrel (EFFIENT) 10 MG TABS Take 10 mg by mouth daily at 3 pm.      ranolazine (RANEXA) 500 MG 12 hr tablet Take 500 mg by mouth 2 (two) times daily.     rosuvastatin (CRESTOR) 20 MG tablet TK 1 T PO QD     TRULICITY 1.5 MG/0.5ML SOPN Inject 1.5 mg into the skin every Wednesday.  2   ACCU-CHEK AVIVA PLUS test strip TEST TID (Patient not taking: No sig reported)  11   B-D ULTRAFINE III SHORT PEN 31G X 8 MM MISC USE UTD BID (Patient not taking: No sig reported)  3   fluticasone (FLONASE) 50 MCG/ACT nasal spray Place 1 spray into both nostrils daily.  (Patient not taking: No sig reported)  1   olopatadine (PATANOL) 0.1 % ophthalmic solution Place 1 drop into both eyes daily. (Patient not taking: No sig reported)     ramipril (ALTACE) 10 MG capsule Take  10 mg by mouth daily.  (Patient not taking: No sig reported)  2   spironolactone (ALDACTONE) 25 MG tablet Take 25 mg by mouth daily. (Patient not taking: No sig reported)     No current facility-administered medications on file prior to visit.    There are no Patient Instructions on file for this visit. No follow-ups on file.   Georgiana Spinner, NP

## 2021-05-22 DIAGNOSIS — E119 Type 2 diabetes mellitus without complications: Secondary | ICD-10-CM | POA: Diagnosis not present

## 2021-05-22 DIAGNOSIS — B351 Tinea unguium: Secondary | ICD-10-CM | POA: Diagnosis not present

## 2021-05-22 DIAGNOSIS — E1142 Type 2 diabetes mellitus with diabetic polyneuropathy: Secondary | ICD-10-CM | POA: Diagnosis not present

## 2021-05-22 DIAGNOSIS — I739 Peripheral vascular disease, unspecified: Secondary | ICD-10-CM | POA: Diagnosis not present

## 2021-05-31 DIAGNOSIS — I10 Essential (primary) hypertension: Secondary | ICD-10-CM | POA: Diagnosis not present

## 2021-05-31 DIAGNOSIS — N189 Chronic kidney disease, unspecified: Secondary | ICD-10-CM | POA: Diagnosis not present

## 2021-05-31 DIAGNOSIS — M109 Gout, unspecified: Secondary | ICD-10-CM | POA: Diagnosis not present

## 2021-05-31 DIAGNOSIS — E119 Type 2 diabetes mellitus without complications: Secondary | ICD-10-CM | POA: Diagnosis not present

## 2021-05-31 DIAGNOSIS — E039 Hypothyroidism, unspecified: Secondary | ICD-10-CM | POA: Diagnosis not present

## 2021-06-23 ENCOUNTER — Other Ambulatory Visit (INDEPENDENT_AMBULATORY_CARE_PROVIDER_SITE_OTHER): Payer: Self-pay | Admitting: Nurse Practitioner

## 2021-06-23 DIAGNOSIS — R600 Localized edema: Secondary | ICD-10-CM

## 2021-06-27 ENCOUNTER — Other Ambulatory Visit: Payer: Self-pay

## 2021-06-27 ENCOUNTER — Ambulatory Visit (INDEPENDENT_AMBULATORY_CARE_PROVIDER_SITE_OTHER): Payer: Medicare Other

## 2021-06-27 ENCOUNTER — Ambulatory Visit (INDEPENDENT_AMBULATORY_CARE_PROVIDER_SITE_OTHER): Payer: Medicare Other | Admitting: Vascular Surgery

## 2021-06-27 VITALS — BP 141/78 | HR 99 | Resp 16 | Wt 288.0 lb

## 2021-06-27 DIAGNOSIS — E782 Mixed hyperlipidemia: Secondary | ICD-10-CM | POA: Diagnosis not present

## 2021-06-27 DIAGNOSIS — L97211 Non-pressure chronic ulcer of right calf limited to breakdown of skin: Secondary | ICD-10-CM | POA: Diagnosis not present

## 2021-06-27 DIAGNOSIS — E119 Type 2 diabetes mellitus without complications: Secondary | ICD-10-CM | POA: Diagnosis not present

## 2021-06-27 DIAGNOSIS — I890 Lymphedema, not elsewhere classified: Secondary | ICD-10-CM | POA: Diagnosis not present

## 2021-06-27 DIAGNOSIS — I10 Essential (primary) hypertension: Secondary | ICD-10-CM

## 2021-06-27 DIAGNOSIS — R600 Localized edema: Secondary | ICD-10-CM

## 2021-06-27 NOTE — Assessment & Plan Note (Signed)
The patient is developed superficial ulcerations of the right calf and lower leg.  He has marked swelling.  Were going to place him in a 3 layer Unna boot today and change this weekly for several weeks.  Once his wounds are healed, we need to get him in compression socks daily and he would clearly benefit from at least daily use of the lymphedema pump.  Return to clinic in 3 to 4 weeks.

## 2021-06-27 NOTE — Progress Notes (Signed)
MRN : [id number]  Derrick Barajas is a 65 y.o. (1955/09/10) male who presents with chief complaint of  Chief Complaint  Patient presents with   Follow-up    Ultrasound follow up  .  History of Present Illness: Patient returns today in follow up of his leg swelling and lymphedema.  He has not been wearing compression socks over the past week or 2 and has now developed diffuse ulceration of the right lower extremity with worsening swelling.  These are draining and weeping.  No ulceration on the left leg, but the swelling is significant.  No fevers or chills or signs of systemic infection.  A venous reflux study was performed today showing no evidence of deep venous thrombosis, superficial thrombophlebitis, or significant venous reflux in either lower extremity.  Current Outpatient Medications  Medication Sig Dispense Refill   amLODipine (NORVASC) 10 MG tablet Take 10 mg by mouth daily.      aspirin EC 81 MG tablet Take 81 mg by mouth daily.     docusate sodium (COLACE) 100 MG capsule Take 100 mg at bedtime by mouth.      ezetimibe (ZETIA) 10 MG tablet Take 10 mg by mouth daily.      furosemide (LASIX) 20 MG tablet Take 20 mg by mouth 2 (two) times daily.      insulin degludec (TRESIBA) 200 UNIT/ML FlexTouch Pen Inject 100 Units into the skin in the morning and at bedtime.      isosorbide mononitrate (IMDUR) 60 MG 24 hr tablet Take 60 mg daily by mouth.     JARDIANCE 25 MG TABS tablet Take 25 mg by mouth every morning.     levothyroxine (SYNTHROID, LEVOTHROID) 125 MCG tablet Take 125 mcg by mouth daily before breakfast.   2   lisinopril (ZESTRIL) 40 MG tablet Take 40 mg by mouth daily.      metoprolol succinate (TOPROL-XL) 100 MG 24 hr tablet Take 100 mg daily by mouth. Take with or immediately following a meal.     nitroGLYCERIN (NITROSTAT) 0.4 MG SL tablet Place 0.4 mg under the tongue every 5 (five) minutes as needed for chest pain.     prasugrel (EFFIENT) 10 MG TABS Take 10 mg by mouth  daily at 3 pm.      ranolazine (RANEXA) 500 MG 12 hr tablet Take 500 mg by mouth 2 (two) times daily.     rosuvastatin (CRESTOR) 20 MG tablet TK 1 T PO QD     TRULICITY 1.5 0000000 SOPN Inject 1.5 mg into the skin every Wednesday.  2   ACCU-CHEK AVIVA PLUS test strip TEST TID (Patient not taking: Reported on 01/25/2021)  11   B-D ULTRAFINE III SHORT PEN 31G X 8 MM MISC USE UTD BID (Patient not taking: Reported on 01/25/2021)  3   fluticasone (FLONASE) 50 MCG/ACT nasal spray Place 1 spray into both nostrils daily.  (Patient not taking: Reported on 01/25/2021)  1   olopatadine (PATANOL) 0.1 % ophthalmic solution Place 1 drop into both eyes daily. (Patient not taking: Reported on 01/25/2021)     ramipril (ALTACE) 10 MG capsule Take 10 mg by mouth daily.  (Patient not taking: Reported on 01/25/2021)  2   spironolactone (ALDACTONE) 25 MG tablet Take 25 mg by mouth daily. (Patient not taking: Reported on 01/25/2021)     No current facility-administered medications for this visit.    Past Medical History:  Diagnosis Date   CAD (coronary artery disease)  CHF (congestive heart failure) (HCC)    Diabetes mellitus without complication (HCC)    Gout    Hyperlipidemia    Hypertension    MI (myocardial infarction) (HCC)    RA (rheumatoid arthritis) (HCC)    Sleep apnea    Stroke (HCC)    left facal droop   Thyroid disease     Past Surgical History:  Procedure Laterality Date   CIRCUMCISION     CORONARY ANGIOPLASTY WITH STENT PLACEMENT     CORONARY STENT INTERVENTION N/A 11/17/2018   Procedure: CORONARY STENT INTERVENTION;  Surgeon: Alwyn Pea, MD;  Location: [facility];  Service: Cardiovascular;  Laterality: N/A;   LEFT HEART CATH AND CORONARY ANGIOGRAPHY Left 11/17/2018   Procedure: LEFT HEART CATH AND CORONARY ANGIOGRAPHY;  Surgeon: Laurier Nancy, MD;  Location: [facility];  Service: Cardiovascular;  Laterality: Left;   TONSILLECTOMY       Social History    Tobacco Use   Smoking status: Never   Smokeless tobacco: Never  Vaping Use   Vaping Use: Never used  Substance Use Topics   Alcohol use: No    Alcohol/week: 0.0 standard drinks   Drug use: No      Family History  Problem Relation Age of Onset   Diabetes Mother    Diabetes Brother     Allergies  Allergen Reactions   Shellfish-Derived Products Other (See Comments)    Other reaction(s): Nausea/Vomiting/Diarrhea, Shock/Unconsciousness   Sulfa Antibiotics Other (See Comments)    Other reaction(s): Nausea/Vomiting/Diarrhea, Shock/Unconsciousness   Iodine Nausea And Vomiting   Atorvastatin Other (See Comments)   Iodinated Diagnostic Agents Other (See Comments)   Nsaids Other (See Comments)    D/t cardiac meds   Shrimp [Shellfish Allergy] Nausea And Vomiting     REVIEW OF SYSTEMS (Negative unless checked)  Constitutional: [] Weight loss  [] Fever  [] Chills Cardiac: [] Chest pain   [] Chest pressure   [] Palpitations   [] Shortness of breath when laying flat   [] Shortness of breath at rest   [] Shortness of breath with exertion. Vascular:  [] Pain in legs with walking   [] Pain in legs at rest   [] Pain in legs when laying flat   [] Claudication   [] Pain in feet when walking  [] Pain in feet at rest  [] Pain in feet when laying flat   [] History of DVT   [] Phlebitis   [] Swelling in legs   [] Varicose veins   [x] Non-healing ulcers Pulmonary:   [] Uses home oxygen   [] Productive cough   [] Hemoptysis   [] Wheeze  [] COPD   [] Asthma Neurologic:  [] Dizziness  [] Blackouts   [] Seizures   [x] History of stroke   [] History of TIA  [] Aphasia   [] Temporary blindness   [] Dysphagia   [] Weakness or numbness in arms   [] Weakness or numbness in legs Musculoskeletal:  [x] Arthritis   [] Joint swelling   [] Joint pain   [] Low back pain Hematologic:  [] Easy bruising  [] Easy bleeding   [] Hypercoagulable state   [] Anemic   Gastrointestinal:  [] Blood in stool   [] Vomiting blood  [] Gastroesophageal reflux/heartburn    [] Abdominal pain Genitourinary:  [] Chronic kidney disease   [] Difficult urination  [] Frequent urination  [] Burning with urination   [] Hematuria Skin:  [] Rashes   [x] Ulcers   [x] Wounds Psychological:  [] History of anxiety   []  History of major depression.  Physical Examination  BP (!) 141/78 (BP Location: Left Arm)    Pulse 99    Resp 16    Wt 288 lb (  130.6 kg)    BMI 45.11 kg/m  Gen:  WD/WN, NAD.  Obese Head: [place]/AT, No temporalis wasting. Ear/Nose/Throat: Hearing grossly intact, nares w/o erythema or drainage Eyes: Conjunctiva clear. Sclera non-icteric Neck: Supple.  Trachea midline Pulmonary:  Good air movement, no use of accessory muscles.  Cardiac: RRR, no JVD Vascular:  Vessel Right Left  Radial Palpable Palpable                          PT Palpable Palpable  DP Palpable Palpable   Gastrointestinal: soft, non-tender/non-distended. No guarding/reflex.  Musculoskeletal: M/S 5/5 throughout.  No deformity or atrophy.  Diffuse superficial ulcerations on the right anterior lower leg tracking around somewhat laterally as well.  3+ right lower extremity edema, 1-2+ left lower extremity edema. Neurologic: Sensation grossly intact in extremities.  Symmetrical.  Speech is fluent.  Psychiatric: Judgment intact, Mood & affect appropriate for pt's clinical situation. Dermatologic: Right calf wounds as described above      Labs No results found for this or any previous visit (from the past 2160 hour(s)).  Radiology No results found.  Assessment/Plan  Hyperlipidemia, mixed lipid control important in reducing the progression of atherosclerotic disease. Continue statin therapy   Diabetes mellitus type 2, uncomplicated (HCC) blood glucose control important in reducing the progression of atherosclerotic disease. Also, involved in wound healing. On appropriate medications.   Hypertension blood pressure control important in reducing the progression of atherosclerotic disease. On  appropriate oral medications.   Lower limb ulcer, calf, right, limited to breakdown of skin ([place]) The patient is developed superficial ulcerations of the right calf and lower leg.  He has marked swelling.  Were going to place him in a 3 layer Unna boot today and change this weekly for several weeks.  Once his wounds are healed, we need to get him in compression socks daily and he would clearly benefit from at least daily use of the lymphedema pump.  Return to clinic in 3 to 4 weeks.  Lymphedema His swelling is still persistent and significant he has now developed ulcerations on the right leg.  Venous duplex today demonstrates no evidence of deep venous thrombosis, superficial thrombophlebitis, or significant venous reflux on either side.  He has stage II-III lymphedema with swelling refractory to compression and elevation and now with skin changes and ulceration.  Weekly Unna boots on the right leg until the wounds have healed.  Compression, elevation, and he would clearly benefit from a lymphedema pump being used daily or twice daily at this point for his severe lymphedema.    Leotis Pain, MD  06/27/2021 9:16 AM    This note was created with Dragon medical transcription system.  Any errors from dictation are purely unintentional

## 2021-06-27 NOTE — Assessment & Plan Note (Signed)
blood glucose control important in reducing the progression of atherosclerotic disease. Also, involved in wound healing. On appropriate medications.  

## 2021-06-27 NOTE — Assessment & Plan Note (Signed)
lipid control important in reducing the progression of atherosclerotic disease. Continue statin therapy  

## 2021-06-27 NOTE — Assessment & Plan Note (Signed)
His swelling is still persistent and significant he has now developed ulcerations on the right leg.  Venous duplex today demonstrates no evidence of deep venous thrombosis, superficial thrombophlebitis, or significant venous reflux on either side.  He has stage II-III lymphedema with swelling refractory to compression and elevation and now with skin changes and ulceration.  Weekly Unna boots on the right leg until the wounds have healed.  Compression, elevation, and he would clearly benefit from a lymphedema pump being used daily or twice daily at this point for his severe lymphedema.

## 2021-06-27 NOTE — Assessment & Plan Note (Signed)
blood pressure control important in reducing the progression of atherosclerotic disease. On appropriate oral medications.  

## 2021-07-05 ENCOUNTER — Other Ambulatory Visit: Payer: Self-pay

## 2021-07-05 ENCOUNTER — Ambulatory Visit (INDEPENDENT_AMBULATORY_CARE_PROVIDER_SITE_OTHER): Payer: Medicare Other | Admitting: Nurse Practitioner

## 2021-07-05 VITALS — BP 119/74 | HR 83 | Ht 67.0 in | Wt 288.0 lb

## 2021-07-05 DIAGNOSIS — L97211 Non-pressure chronic ulcer of right calf limited to breakdown of skin: Secondary | ICD-10-CM

## 2021-07-05 NOTE — Progress Notes (Signed)
History of Present Illness  There is no documented history at this time  Assessments & Plan   There are no diagnoses linked to this encounter.    Additional instructions  Subjective:  Patient presents with venous ulcer of the Right lower extremity.    Procedure:  3 layer unna wrap was placed Right lower extremity.   Plan:   Follow up in one week.   

## 2021-07-10 ENCOUNTER — Encounter (INDEPENDENT_AMBULATORY_CARE_PROVIDER_SITE_OTHER): Payer: Self-pay | Admitting: Nurse Practitioner

## 2021-07-11 DIAGNOSIS — E119 Type 2 diabetes mellitus without complications: Secondary | ICD-10-CM | POA: Diagnosis not present

## 2021-07-11 DIAGNOSIS — E039 Hypothyroidism, unspecified: Secondary | ICD-10-CM | POA: Diagnosis not present

## 2021-07-11 DIAGNOSIS — M109 Gout, unspecified: Secondary | ICD-10-CM | POA: Diagnosis not present

## 2021-07-11 DIAGNOSIS — I10 Essential (primary) hypertension: Secondary | ICD-10-CM | POA: Diagnosis not present

## 2021-07-12 ENCOUNTER — Ambulatory Visit (INDEPENDENT_AMBULATORY_CARE_PROVIDER_SITE_OTHER): Payer: Medicare Other | Admitting: Nurse Practitioner

## 2021-07-12 ENCOUNTER — Encounter (INDEPENDENT_AMBULATORY_CARE_PROVIDER_SITE_OTHER): Payer: Self-pay

## 2021-07-12 VITALS — BP 138/77 | HR 79 | Resp 17 | Ht 67.0 in | Wt 288.0 lb

## 2021-07-12 DIAGNOSIS — Z0001 Encounter for general adult medical examination with abnormal findings: Secondary | ICD-10-CM | POA: Diagnosis not present

## 2021-07-12 DIAGNOSIS — L97211 Non-pressure chronic ulcer of right calf limited to breakdown of skin: Secondary | ICD-10-CM | POA: Diagnosis not present

## 2021-07-12 DIAGNOSIS — E119 Type 2 diabetes mellitus without complications: Secondary | ICD-10-CM | POA: Diagnosis not present

## 2021-07-12 DIAGNOSIS — I509 Heart failure, unspecified: Secondary | ICD-10-CM | POA: Diagnosis not present

## 2021-07-12 DIAGNOSIS — I10 Essential (primary) hypertension: Secondary | ICD-10-CM | POA: Diagnosis not present

## 2021-07-12 DIAGNOSIS — M109 Gout, unspecified: Secondary | ICD-10-CM | POA: Diagnosis not present

## 2021-07-12 DIAGNOSIS — N189 Chronic kidney disease, unspecified: Secondary | ICD-10-CM | POA: Diagnosis not present

## 2021-07-12 NOTE — Progress Notes (Deleted)
History of Present Illness  There is no documented history at this time  Assessments & Plan   There are no diagnoses linked to this encounter.    Additional instructions  Subjective:  Patient presents with venous ulcer of the Right lower extremity.    Procedure:  3 layer unna wrap was placed Right lower extremity.   Plan:   Follow up in one week.   

## 2021-07-16 ENCOUNTER — Encounter (INDEPENDENT_AMBULATORY_CARE_PROVIDER_SITE_OTHER): Payer: Self-pay | Admitting: Nurse Practitioner

## 2021-07-19 ENCOUNTER — Encounter (INDEPENDENT_AMBULATORY_CARE_PROVIDER_SITE_OTHER): Payer: Self-pay

## 2021-07-19 ENCOUNTER — Other Ambulatory Visit: Payer: Self-pay

## 2021-07-19 ENCOUNTER — Ambulatory Visit (INDEPENDENT_AMBULATORY_CARE_PROVIDER_SITE_OTHER): Payer: Commercial Managed Care - HMO | Admitting: Nurse Practitioner

## 2021-07-19 VITALS — BP 146/83 | HR 80 | Ht 67.0 in | Wt 285.0 lb

## 2021-07-19 DIAGNOSIS — I890 Lymphedema, not elsewhere classified: Secondary | ICD-10-CM

## 2021-07-19 NOTE — Progress Notes (Signed)
Unna wrap was not necessary today and the patient informed that he has been wearing his compression stockings daily.Patient will return next week for his follow up with provider

## 2021-07-26 ENCOUNTER — Ambulatory Visit (INDEPENDENT_AMBULATORY_CARE_PROVIDER_SITE_OTHER): Payer: Medicare Other | Admitting: Nurse Practitioner

## 2021-07-28 ENCOUNTER — Other Ambulatory Visit: Payer: Self-pay

## 2021-07-28 ENCOUNTER — Encounter (INDEPENDENT_AMBULATORY_CARE_PROVIDER_SITE_OTHER): Payer: Self-pay | Admitting: Nurse Practitioner

## 2021-07-28 ENCOUNTER — Ambulatory Visit (INDEPENDENT_AMBULATORY_CARE_PROVIDER_SITE_OTHER): Payer: Medicare Other | Admitting: Nurse Practitioner

## 2021-07-28 VITALS — BP 145/83 | HR 88 | Resp 16 | Wt 283.4 lb

## 2021-07-28 DIAGNOSIS — E119 Type 2 diabetes mellitus without complications: Secondary | ICD-10-CM | POA: Diagnosis not present

## 2021-07-28 DIAGNOSIS — E782 Mixed hyperlipidemia: Secondary | ICD-10-CM | POA: Diagnosis not present

## 2021-07-28 DIAGNOSIS — I890 Lymphedema, not elsewhere classified: Secondary | ICD-10-CM | POA: Diagnosis not present

## 2021-07-29 ENCOUNTER — Encounter (INDEPENDENT_AMBULATORY_CARE_PROVIDER_SITE_OTHER): Payer: Self-pay | Admitting: Nurse Practitioner

## 2021-07-29 NOTE — Progress Notes (Signed)
Subjective:    Patient ID: Derrick Barajas, male    DOB: December 18, 1955, 66 y.o.   MRN: [id number] Chief Complaint  Patient presents with   Follow-up    3-4 week unna boot check    The patient returns to the office for followup evaluation regarding leg swelling.  The swelling has improved quite a bit and the pain associated with swelling has decreased substantially.  He was previously in Unna wraps to help control his swelling.  There is a small superficial wound on the right lower extremity.  Since the previous visit the patient has been wearing graduated compression stockings and has noted  significant improvement in the lymphedema. The patient has been using compression routinely morning until night.  The patient also states elevation during the day and exercise is being done too.  He notes that he is exercising on a treadmill at least 3 times a week       Review of Systems  Cardiovascular:  Positive for leg swelling.  Skin:  Positive for wound.  All other systems reviewed and are negative.     Objective:   Physical Exam Vitals reviewed.  HENT:     Head: Normocephalic.  Cardiovascular:     Rate and Rhythm: Normal rate.  Pulmonary:     Effort: Pulmonary effort is normal.  Musculoskeletal:     Right lower leg: 1+ Edema present.     Left lower leg: 1+ Edema present.  Skin:    General: Skin is warm and dry.  Neurological:     Mental Status: He is alert and oriented to person, place, and time.  Psychiatric:        Mood and Affect: Mood normal.        Behavior: Behavior normal.        Thought Content: Thought content normal.        Judgment: Judgment normal.    BP (!) 145/83 (BP Location: Right Arm)    Pulse 88    Resp 16    Wt 283 lb 6.4 oz (128.5 kg)    BMI 44.39 kg/m   Past Medical History:  Diagnosis Date   CAD (coronary artery disease)    CHF (congestive heart failure) (HCC)    Diabetes mellitus without complication (HCC)    Gout    Hyperlipidemia     Hypertension    MI (myocardial infarction) (HCC)    RA (rheumatoid arthritis) (HCC)    Sleep apnea    Stroke (HCC)    left facal droop   Thyroid disease     Social History   Socioeconomic History   Marital status: Legally Separated    Spouse name: Not on file   Number of children: Not on file   Years of education: Not on file   Highest education level: Not on file  Occupational History   Not on file  Tobacco Use   Smoking status: Never   Smokeless tobacco: Never  Vaping Use   Vaping Use: Never used  Substance and Sexual Activity   Alcohol use: No    Alcohol/week: 0.0 standard drinks   Drug use: No   Sexual activity: Never    Partners: Female    Birth control/protection: None  Other Topics Concern   Not on file  Social History Narrative   Not on file   Social Determinants of Health   Financial Resource Strain: Not on file  Food Insecurity: Not on file  Transportation Needs: Not on file  Physical Activity: Not on file  Stress: Not on file  Social Connections: Not on file  Intimate Partner Violence: Not on file    Past Surgical History:  Procedure Laterality Date   CIRCUMCISION     CORONARY ANGIOPLASTY WITH STENT PLACEMENT     CORONARY STENT INTERVENTION N/A 11/17/2018   Procedure: CORONARY STENT INTERVENTION;  Surgeon: Yolonda Kida, MD;  Location: [facility];  Service: Cardiovascular;  Laterality: N/A;   LEFT HEART CATH AND CORONARY ANGIOGRAPHY Left 11/17/2018   Procedure: LEFT HEART CATH AND CORONARY ANGIOGRAPHY;  Surgeon: Dionisio David, MD;  Location: [facility];  Service: Cardiovascular;  Laterality: Left;   TONSILLECTOMY      Family History  Problem Relation Age of Onset   Diabetes Mother    Diabetes Brother     Allergies  Allergen Reactions   Shellfish-Derived Products Other (See Comments)    Other reaction(s): Nausea/Vomiting/Diarrhea, Shock/Unconsciousness   Sulfa Antibiotics Other (See Comments)    Other reaction(s):  Nausea/Vomiting/Diarrhea, Shock/Unconsciousness   Iodine Nausea And Vomiting   Atorvastatin Other (See Comments)   Iodinated Contrast Media Other (See Comments)   Nsaids Other (See Comments)    D/t cardiac meds   Shrimp [Shellfish Allergy] Nausea And Vomiting    CBC Latest Ref Rng & Units 07/13/2020 09/11/2018 02/04/2017  WBC 4.0 - 10.5 K/uL 5.6 6.2 5.6  Hemoglobin 13.0 - 17.0 g/dL 14.0 14.4 14.0  Hematocrit 39.0 - 52.0 % 43.9 44.1 42.4  Platelets 150 - 400 K/uL 235 231 203      CMP     Component Value Date/Time   NA 140 07/13/2020 1450   NA 140 05/03/2014 1044   K 4.2 07/13/2020 1450   K 4.2 05/03/2014 1044   CL 104 07/13/2020 1450   CL 107 05/03/2014 1044   CO2 27 07/13/2020 1450   CO2 27 05/03/2014 1044   GLUCOSE 118 (H) 07/13/2020 1450   GLUCOSE 155 (H) 05/03/2014 1044   BUN 21 07/13/2020 1450   BUN 13 05/03/2014 1044   CREATININE 1.34 (H) 07/13/2020 1450   CREATININE 1.15 05/03/2014 1044   CALCIUM 8.9 07/13/2020 1450   CALCIUM 8.4 (L) 05/03/2014 1044   PROT 6.6 02/04/2017 1223   PROT 7.3 05/03/2014 1044   ALBUMIN 3.7 02/04/2017 1223   ALBUMIN 3.4 05/03/2014 1044   AST 25 02/04/2017 1223   AST 27 05/03/2014 1044   ALT 16 (L) 02/04/2017 1223   ALT 33 05/03/2014 1044   ALKPHOS 62 02/04/2017 1223   ALKPHOS 93 05/03/2014 1044   BILITOT 0.4 02/04/2017 1223   BILITOT 0.2 05/03/2014 1044   GFRNONAA 59 (L) 07/13/2020 1450   GFRNONAA >60 05/03/2014 1044   GFRNONAA >60 09/23/2012 1007   GFRAA >60 11/18/2018 0352   GFRAA >60 05/03/2014 1044   GFRAA >60 09/23/2012 1007     No results found.     Assessment & Plan:   1. Lymphedema  No surgery or intervention at this point in time.    I have reviewed my discussion with the patient regarding lymphedema and why it  causes symptoms.  Patient will continue wearing graduated compression stockings class 1 (20-30 mmHg) on a daily basis a prescription was given. The patient is reminded to put the stockings on first thing in  the morning and removing them in the evening. The patient is instructed specifically not to sleep in the stockings.   In addition, behavioral modification throughout the day will be continued.  This will include frequent elevation (such as in a recliner), use of over the counter pain medications as needed and exercise such as walking.  The patient will follow-up in 6 months  2. Hyperlipidemia, mixed Continue statin as ordered and reviewed, no changes at this time   3. Type 2 diabetes mellitus without complication, unspecified whether long term insulin use (HCC) Continue hypoglycemic medications as already ordered, these medications have been reviewed and there are no changes at this time.  Hgb A1C to be monitored as already arranged by primary service    Current Outpatient Medications on File Prior to Visit  Medication Sig Dispense Refill   amLODipine (NORVASC) 10 MG tablet Take 10 mg by mouth daily.      aspirin EC 81 MG tablet Take 81 mg by mouth daily.     docusate sodium (COLACE) 100 MG capsule Take 100 mg at bedtime by mouth.      ezetimibe (ZETIA) 10 MG tablet Take 10 mg by mouth daily.      furosemide (LASIX) 20 MG tablet Take 20 mg by mouth 2 (two) times daily.      insulin degludec (TRESIBA) 200 UNIT/ML FlexTouch Pen Inject 100 Units into the skin in the morning and at bedtime.      isosorbide mononitrate (IMDUR) 60 MG 24 hr tablet Take 60 mg daily by mouth.     JARDIANCE 25 MG TABS tablet Take 25 mg by mouth every morning.     levothyroxine (SYNTHROID, LEVOTHROID) 125 MCG tablet Take 125 mcg by mouth daily before breakfast.   2   lisinopril (ZESTRIL) 40 MG tablet Take 40 mg by mouth daily.      metoprolol succinate (TOPROL-XL) 100 MG 24 hr tablet Take 100 mg daily by mouth. Take with or immediately following a meal.     nitroGLYCERIN (NITROSTAT) 0.4 MG SL tablet Place 0.4 mg under the tongue every 5 (five) minutes as needed for chest pain.     prasugrel (EFFIENT) 10 MG TABS  Take 10 mg by mouth daily at 3 pm.      ranolazine (RANEXA) 500 MG 12 hr tablet Take 500 mg by mouth 2 (two) times daily.     rosuvastatin (CRESTOR) 20 MG tablet TK 1 T PO QD     TRULICITY 1.5 0000000 SOPN Inject 1.5 mg into the skin every Wednesday.  2   ACCU-CHEK AVIVA PLUS test strip TEST TID (Patient not taking: Reported on 01/25/2021)  11   B-D ULTRAFINE III SHORT PEN 31G X 8 MM MISC USE UTD BID (Patient not taking: Reported on 01/25/2021)  3   fluticasone (FLONASE) 50 MCG/ACT nasal spray Place 1 spray into both nostrils daily.  (Patient not taking: Reported on 01/25/2021)  1   olopatadine (PATANOL) 0.1 % ophthalmic solution Place 1 drop into both eyes daily. (Patient not taking: Reported on 01/25/2021)     ramipril (ALTACE) 10 MG capsule Take 10 mg by mouth daily.  (Patient not taking: Reported on 01/25/2021)  2   spironolactone (ALDACTONE) 25 MG tablet Take 25 mg by mouth daily. (Patient not taking: Reported on 01/25/2021)     No current facility-administered medications on file prior to visit.    There are no Patient Instructions on file for this visit. No follow-ups on file.   Kris Hartmann, NP

## 2021-08-14 ENCOUNTER — Encounter (INDEPENDENT_AMBULATORY_CARE_PROVIDER_SITE_OTHER): Payer: Self-pay | Admitting: Nurse Practitioner

## 2021-08-18 DIAGNOSIS — E785 Hyperlipidemia, unspecified: Secondary | ICD-10-CM | POA: Diagnosis not present

## 2021-08-18 DIAGNOSIS — Z794 Long term (current) use of insulin: Secondary | ICD-10-CM | POA: Diagnosis not present

## 2021-08-18 DIAGNOSIS — E1169 Type 2 diabetes mellitus with other specified complication: Secondary | ICD-10-CM | POA: Diagnosis not present

## 2021-08-18 DIAGNOSIS — E1165 Type 2 diabetes mellitus with hyperglycemia: Secondary | ICD-10-CM | POA: Diagnosis not present

## 2021-08-18 DIAGNOSIS — E1159 Type 2 diabetes mellitus with other circulatory complications: Secondary | ICD-10-CM | POA: Diagnosis not present

## 2021-08-18 DIAGNOSIS — E1142 Type 2 diabetes mellitus with diabetic polyneuropathy: Secondary | ICD-10-CM | POA: Diagnosis not present

## 2021-08-18 DIAGNOSIS — I739 Peripheral vascular disease, unspecified: Secondary | ICD-10-CM | POA: Diagnosis not present

## 2021-08-23 DIAGNOSIS — N189 Chronic kidney disease, unspecified: Secondary | ICD-10-CM | POA: Diagnosis not present

## 2021-08-23 DIAGNOSIS — M109 Gout, unspecified: Secondary | ICD-10-CM | POA: Diagnosis not present

## 2021-08-23 DIAGNOSIS — I10 Essential (primary) hypertension: Secondary | ICD-10-CM | POA: Diagnosis not present

## 2021-08-23 DIAGNOSIS — E119 Type 2 diabetes mellitus without complications: Secondary | ICD-10-CM | POA: Diagnosis not present

## 2021-08-25 DIAGNOSIS — E1142 Type 2 diabetes mellitus with diabetic polyneuropathy: Secondary | ICD-10-CM | POA: Diagnosis not present

## 2021-08-25 DIAGNOSIS — B351 Tinea unguium: Secondary | ICD-10-CM | POA: Diagnosis not present

## 2021-09-04 DIAGNOSIS — I351 Nonrheumatic aortic (valve) insufficiency: Secondary | ICD-10-CM | POA: Diagnosis not present

## 2021-09-04 DIAGNOSIS — I219 Acute myocardial infarction, unspecified: Secondary | ICD-10-CM | POA: Diagnosis not present

## 2021-09-04 DIAGNOSIS — I340 Nonrheumatic mitral (valve) insufficiency: Secondary | ICD-10-CM | POA: Diagnosis not present

## 2021-09-04 DIAGNOSIS — I10 Essential (primary) hypertension: Secondary | ICD-10-CM | POA: Diagnosis not present

## 2021-09-04 DIAGNOSIS — E782 Mixed hyperlipidemia: Secondary | ICD-10-CM | POA: Diagnosis not present

## 2021-09-04 DIAGNOSIS — I2510 Atherosclerotic heart disease of native coronary artery without angina pectoris: Secondary | ICD-10-CM | POA: Diagnosis not present

## 2021-09-04 DIAGNOSIS — E119 Type 2 diabetes mellitus without complications: Secondary | ICD-10-CM | POA: Diagnosis not present

## 2021-09-04 DIAGNOSIS — J45998 Other asthma: Secondary | ICD-10-CM | POA: Diagnosis not present

## 2021-09-25 DIAGNOSIS — L731 Pseudofolliculitis barbae: Secondary | ICD-10-CM | POA: Diagnosis not present

## 2021-09-25 DIAGNOSIS — L089 Local infection of the skin and subcutaneous tissue, unspecified: Secondary | ICD-10-CM | POA: Diagnosis not present

## 2021-09-28 DIAGNOSIS — N189 Chronic kidney disease, unspecified: Secondary | ICD-10-CM | POA: Diagnosis not present

## 2021-09-28 DIAGNOSIS — E119 Type 2 diabetes mellitus without complications: Secondary | ICD-10-CM | POA: Diagnosis not present

## 2021-10-04 DIAGNOSIS — Z23 Encounter for immunization: Secondary | ICD-10-CM | POA: Diagnosis not present

## 2021-10-04 DIAGNOSIS — I10 Essential (primary) hypertension: Secondary | ICD-10-CM | POA: Diagnosis not present

## 2021-10-04 DIAGNOSIS — E119 Type 2 diabetes mellitus without complications: Secondary | ICD-10-CM | POA: Diagnosis not present

## 2021-10-04 DIAGNOSIS — M109 Gout, unspecified: Secondary | ICD-10-CM | POA: Diagnosis not present

## 2021-10-04 DIAGNOSIS — N189 Chronic kidney disease, unspecified: Secondary | ICD-10-CM | POA: Diagnosis not present

## 2021-10-06 DIAGNOSIS — I129 Hypertensive chronic kidney disease with stage 1 through stage 4 chronic kidney disease, or unspecified chronic kidney disease: Secondary | ICD-10-CM | POA: Diagnosis not present

## 2021-10-06 DIAGNOSIS — E782 Mixed hyperlipidemia: Secondary | ICD-10-CM | POA: Diagnosis not present

## 2021-10-06 DIAGNOSIS — N189 Chronic kidney disease, unspecified: Secondary | ICD-10-CM | POA: Diagnosis not present

## 2021-10-26 ENCOUNTER — Ambulatory Visit (INDEPENDENT_AMBULATORY_CARE_PROVIDER_SITE_OTHER): Payer: Medicare HMO | Admitting: Nurse Practitioner

## 2021-11-02 ENCOUNTER — Ambulatory Visit (INDEPENDENT_AMBULATORY_CARE_PROVIDER_SITE_OTHER): Payer: Medicare Other | Admitting: Nurse Practitioner

## 2021-11-02 ENCOUNTER — Encounter (INDEPENDENT_AMBULATORY_CARE_PROVIDER_SITE_OTHER): Payer: Self-pay | Admitting: Nurse Practitioner

## 2021-11-02 VITALS — BP 120/66 | HR 73 | Resp 18 | Ht 67.0 in | Wt 284.0 lb

## 2021-11-02 DIAGNOSIS — I890 Lymphedema, not elsewhere classified: Secondary | ICD-10-CM | POA: Diagnosis not present

## 2021-11-02 DIAGNOSIS — E119 Type 2 diabetes mellitus without complications: Secondary | ICD-10-CM

## 2021-11-02 DIAGNOSIS — I10 Essential (primary) hypertension: Secondary | ICD-10-CM

## 2021-11-08 DIAGNOSIS — E119 Type 2 diabetes mellitus without complications: Secondary | ICD-10-CM | POA: Diagnosis not present

## 2021-11-12 ENCOUNTER — Encounter (INDEPENDENT_AMBULATORY_CARE_PROVIDER_SITE_OTHER): Payer: Self-pay | Admitting: Nurse Practitioner

## 2021-11-12 NOTE — Progress Notes (Signed)
? ?Subjective:  ? ? Patient ID: Derrick Barajas, male    DOB: October 05, 1955, 66 y.o.   MRN: [id number] ?No chief complaint on file. ? ? ?The patient returns to the office for followup evaluation regarding leg swelling.  The swelling has improved quite a bit and the pain associated with swelling has decreased substantially. There have not been any interval development of a ulcerations or wounds. ? ?Since the previous visit the patient has been wearing graduated compression stockings and has noted some improvement in the lymphedema. The patient has been using compression routinely morning until night. ? ?The patient also states elevation during the day and exercise (such as walking) is being done too. ? ? ?  ? ? ?Review of Systems  ?Cardiovascular:  Positive for leg swelling.  ?All other systems reviewed and are negative. ? ?   ?Objective:  ? Physical Exam ?Vitals reviewed.  ?HENT:  ?   Head: Normocephalic.  ?Cardiovascular:  ?   Rate and Rhythm: Normal rate.  ?Pulmonary:  ?   Effort: Pulmonary effort is normal.  ?Musculoskeletal:  ?   Right lower leg: 1+ Edema present.  ?   Left lower leg: 1+ Edema present.  ?Skin: ?   General: Skin is warm and dry.  ?Neurological:  ?   Mental Status: He is alert and oriented to person, place, and time.  ?Psychiatric:     ?   Mood and Affect: Mood normal.     ?   Behavior: Behavior normal.     ?   Thought Content: Thought content normal.     ?   Judgment: Judgment normal.  ? ? ?BP 120/66 (BP Location: Right Arm)   Pulse 73   Resp 18   Ht 5\' 7"  (1.702 m)   Wt 284 lb (128.8 kg)   BMI 44.48 kg/m?  ? ?Past Medical History:  ?Diagnosis Date  ? CAD (coronary artery disease)   ? CHF (congestive heart failure) ([place])   ? Diabetes mellitus without complication ([place])   ? Gout   ? Hyperlipidemia   ? Hypertension   ? MI (myocardial infarction) ([place])   ? RA (rheumatoid arthritis) ([place])   ? Sleep apnea   ? Stroke [facility])   ? left facal droop  ? Thyroid disease   ? ? ?Social History  ? ?Socioeconomic  History  ? Marital status: Legally Separated  ?  Spouse name: Not on file  ? Number of children: Not on file  ? Years of education: Not on file  ? Highest education level: Not on file  ?Occupational History  ? Not on file  ?Tobacco Use  ? Smoking status: Never  ? Smokeless tobacco: Never  ?Vaping Use  ? Vaping Use: Never used  ?Substance and Sexual Activity  ? Alcohol use: No  ?  Alcohol/week: 0.0 standard drinks  ? Drug use: No  ? Sexual activity: Never  ?  Partners: Female  ?  Birth control/protection: None  ?Other Topics Concern  ? Not on file  ?Social History Narrative  ? Not on file  ? ?Social Determinants of Health  ? ?Financial Resource Strain: Not on file  ?Food Insecurity: Not on file  ?Transportation Needs: Not on file  ?Physical Activity: Not on file  ?Stress: Not on file  ?Social Connections: Not on file  ?Intimate Partner Violence: Not on file  ? ? ?Past Surgical History:  ?Procedure Laterality Date  ? CIRCUMCISION    ? CORONARY ANGIOPLASTY WITH STENT PLACEMENT    ?  CORONARY STENT INTERVENTION N/A 11/17/2018  ? Procedure: CORONARY STENT INTERVENTION;  Surgeon: Yolonda Kida, MD;  Location: [facility];  Service: Cardiovascular;  Laterality: N/A;  ? LEFT HEART CATH AND CORONARY ANGIOGRAPHY Left 11/17/2018  ? Procedure: LEFT HEART CATH AND CORONARY ANGIOGRAPHY;  Surgeon: Dionisio David, MD;  Location: [facility];  Service: Cardiovascular;  Laterality: Left;  ? TONSILLECTOMY    ? ? ?Family History  ?Problem Relation Age of Onset  ? Diabetes Mother   ? Diabetes Brother   ? ? ?Allergies  ?Allergen Reactions  ? Shellfish-Derived Products Other (See Comments)  ?  Other reaction(s): Nausea/Vomiting/Diarrhea, Shock/Unconsciousness  ? Sulfa Antibiotics Other (See Comments)  ?  Other reaction(s): Nausea/Vomiting/Diarrhea, Shock/Unconsciousness  ? Iodine Nausea And Vomiting  ? Atorvastatin Other (See Comments)  ? Iodinated Contrast Media Other (See Comments)  ? Nsaids Other (See Comments)  ?   D/t cardiac meds  ? Shrimp [Shellfish Allergy] Nausea And Vomiting  ? ? ? ?  Latest Ref Rng & Units 07/13/2020  ?  2:50 PM 09/11/2018  ?  1:22 PM 02/04/2017  ? 12:23 PM  ?CBC  ?WBC 4.0 - 10.5 K/uL 5.6   6.2   5.6    ?Hemoglobin 13.0 - 17.0 g/dL 14.0   14.4   14.0    ?Hematocrit 39.0 - 52.0 % 43.9   44.1   42.4    ?Platelets 150 - 400 K/uL 235   231   203    ? ? ? ? ?CMP  ?   ?Component Value Date/Time  ? NA 140 07/13/2020 1450  ? NA 140 05/03/2014 1044  ? K 4.2 07/13/2020 1450  ? K 4.2 05/03/2014 1044  ? CL 104 07/13/2020 1450  ? CL 107 05/03/2014 1044  ? CO2 27 07/13/2020 1450  ? CO2 27 05/03/2014 1044  ? GLUCOSE 118 (H) 07/13/2020 1450  ? GLUCOSE 155 (H) 05/03/2014 1044  ? BUN 21 07/13/2020 1450  ? BUN 13 05/03/2014 1044  ? CREATININE 1.34 (H) 07/13/2020 1450  ? CREATININE 1.15 05/03/2014 1044  ? CALCIUM 8.9 07/13/2020 1450  ? CALCIUM 8.4 (L) 05/03/2014 1044  ? PROT 6.6 02/04/2017 1223  ? PROT 7.3 05/03/2014 1044  ? ALBUMIN 3.7 02/04/2017 1223  ? ALBUMIN 3.4 05/03/2014 1044  ? AST 25 02/04/2017 1223  ? AST 27 05/03/2014 1044  ? ALT 16 (L) 02/04/2017 1223  ? ALT 33 05/03/2014 1044  ? ALKPHOS 62 02/04/2017 1223  ? ALKPHOS 93 05/03/2014 1044  ? BILITOT 0.4 02/04/2017 1223  ? BILITOT 0.2 05/03/2014 1044  ? GFRNONAA 59 (L) 07/13/2020 1450  ? GFRNONAA >60 05/03/2014 1044  ? GFRNONAA >60 09/23/2012 1007  ? GFRAA >60 11/18/2018 0352  ? GFRAA >60 05/03/2014 1044  ? GFRAA >60 09/23/2012 1007  ? ? ? ?No results found. ? ?   ?Assessment & Plan:  ? ?1. Lymphedema ?Recommend: ? ?No surgery or intervention at this point in time.   ? ?I have reviewed my discussion with the patient regarding venous insufficiency and secondary lymph edema and why it  causes symptoms. I have discussed with the patient the chronic skin changes that accompany these problems and the long term sequela such as ulceration and infection.  Patient will continue wearing graduated compression on a daily basis a prescription, if needed, was given to the patient to  keep this updated. The patient will  put the compression on first thing in the morning and removing them in  the evening. The patient is instructed specifically not to sleep in the compression.  In addition, behavioral modification including elevation during the day will be continued.  Diet and salt restriction will also be helpful. ? ?Previous duplex ultrasound of the lower extremities shows normal deep venous system, superficial reflux was not present.  ? ?Following the review of the ultrasound the patient will follow up in 12 months to reassess the degree of swelling and the control that graduated compression is offering.   The patient can be assessed for a Lymph Pump at that time.  However, at this time the patient states they are satisfied with the control compression and elevation is yielding.  ?  ? ?2. Hypertension, unspecified type ?Continue antihypertensive medications as already ordered, these medications have been reviewed and there are no changes at this time.  ? ?3. Type 2 diabetes mellitus without complication, unspecified whether long term insulin use ([place]) ?Continue hypoglycemic medications as already ordered, these medications have been reviewed and there are no changes at this time. ? ?Hgb A1C to be monitored as already arranged by primary service  ? ? ?Current Outpatient Medications on File Prior to Visit  ?Medication Sig Dispense Refill  ? ACCU-CHEK AVIVA PLUS test strip   11  ? amLODipine (NORVASC) 10 MG tablet Take 10 mg by mouth daily.     ? aspirin EC 81 MG tablet Take 81 mg by mouth daily.    ? B-D ULTRAFINE III SHORT PEN 31G X 8 MM MISC   3  ? docusate sodium (COLACE) 100 MG capsule Take 100 mg at bedtime by mouth.     ? ezetimibe (ZETIA) 10 MG tablet Take 10 mg by mouth daily.     ? fluticasone (FLONASE) 50 MCG/ACT nasal spray Place 1 spray into both nostrils daily.  1  ? furosemide (LASIX) 20 MG tablet Take 20 mg by mouth 2 (two) times daily.     ? insulin degludec (TRESIBA) 200 UNIT/ML  FlexTouch Pen Inject 100 Units into the skin in the morning and at bedtime.     ? isosorbide mononitrate (IMDUR) 60 MG 24 hr tablet Take 60 mg daily by mouth.    ? JARDIANCE 25 MG TABS tablet Take 25 mg by m

## 2021-11-15 DIAGNOSIS — I10 Essential (primary) hypertension: Secondary | ICD-10-CM | POA: Diagnosis not present

## 2021-11-15 DIAGNOSIS — N189 Chronic kidney disease, unspecified: Secondary | ICD-10-CM | POA: Diagnosis not present

## 2021-11-15 DIAGNOSIS — E119 Type 2 diabetes mellitus without complications: Secondary | ICD-10-CM | POA: Diagnosis not present

## 2021-11-15 DIAGNOSIS — M109 Gout, unspecified: Secondary | ICD-10-CM | POA: Diagnosis not present

## 2021-12-01 ENCOUNTER — Other Ambulatory Visit: Payer: Self-pay

## 2021-12-01 ENCOUNTER — Encounter: Payer: Self-pay | Admitting: Emergency Medicine

## 2021-12-01 ENCOUNTER — Emergency Department
Admission: EM | Admit: 2021-12-01 | Discharge: 2021-12-01 | Disposition: A | Discharge status: Home / Self Care | Payer: Medicare Other | Attending: Emergency Medicine | Admitting: Emergency Medicine

## 2021-12-01 DIAGNOSIS — E119 Type 2 diabetes mellitus without complications: Secondary | ICD-10-CM | POA: Insufficient documentation

## 2021-12-01 DIAGNOSIS — Z23 Encounter for immunization: Secondary | ICD-10-CM | POA: Diagnosis not present

## 2021-12-01 DIAGNOSIS — S99922A Unspecified injury of left foot, initial encounter: Secondary | ICD-10-CM | POA: Diagnosis present

## 2021-12-01 DIAGNOSIS — I2510 Atherosclerotic heart disease of native coronary artery without angina pectoris: Secondary | ICD-10-CM | POA: Diagnosis not present

## 2021-12-01 DIAGNOSIS — S91312A Laceration without foreign body, left foot, initial encounter: Secondary | ICD-10-CM | POA: Insufficient documentation

## 2021-12-01 DIAGNOSIS — W19XXXA Unspecified fall, initial encounter: Secondary | ICD-10-CM | POA: Diagnosis not present

## 2021-12-01 DIAGNOSIS — I509 Heart failure, unspecified: Secondary | ICD-10-CM | POA: Diagnosis not present

## 2021-12-01 DIAGNOSIS — Y9301 Activity, walking, marching and hiking: Secondary | ICD-10-CM | POA: Insufficient documentation

## 2021-12-01 DIAGNOSIS — Z8673 Personal history of transient ischemic attack (TIA), and cerebral infarction without residual deficits: Secondary | ICD-10-CM | POA: Diagnosis not present

## 2021-12-01 DIAGNOSIS — E039 Hypothyroidism, unspecified: Secondary | ICD-10-CM | POA: Insufficient documentation

## 2021-12-01 DIAGNOSIS — W25XXXA Contact with sharp glass, initial encounter: Secondary | ICD-10-CM | POA: Diagnosis not present

## 2021-12-01 DIAGNOSIS — S91109A Unspecified open wound of unspecified toe(s) without damage to nail, initial encounter: Secondary | ICD-10-CM | POA: Diagnosis not present

## 2021-12-01 DIAGNOSIS — I110 Hypertensive heart disease with heart failure: Secondary | ICD-10-CM | POA: Insufficient documentation

## 2021-12-01 DIAGNOSIS — R5381 Other malaise: Secondary | ICD-10-CM | POA: Diagnosis not present

## 2021-12-01 MED ORDER — LIDOCAINE HCL (PF) 1 % IJ SOLN
5.0000 mL | Freq: Once | INTRAMUSCULAR | Status: DC
  Filled 2021-12-01: qty 5

## 2021-12-01 MED ORDER — TETANUS-DIPHTH-ACELL PERTUSSIS 5-2.5-18.5 LF-MCG/0.5 IM SUSY
0.5000 mL | PREFILLED_SYRINGE | Freq: Once | INTRAMUSCULAR | Status: AC
  Administered 2021-12-01: 0.5 mL via INTRAMUSCULAR
  Filled 2021-12-01: qty 0.5

## 2021-12-01 MED ORDER — CEPHALEXIN 500 MG PO CAPS: 500.0000 mg | ORAL_CAPSULE | Freq: Three times a day (TID) | ORAL | 0 refills | Status: DC

## 2021-12-01 MED ORDER — LIDOCAINE HCL (PF) 1 % IJ SOLN
INTRAMUSCULAR | Status: AC
  Filled 2021-12-01: qty 5

## 2021-12-01 MED ORDER — LIDOCAINE HCL (PF) 1 % IJ SOLN
5.0000 mL | Freq: Once | INTRAMUSCULAR | Status: AC
  Administered 2021-12-01: 5 mL
  Filled 2021-12-01: qty 5

## 2021-12-01 NOTE — ED Provider Notes (Signed)
[facility] Provider Note    Event Date/Time   First MD Initiated Contact with Patient 12/01/21 [phone]     (approximate)   History   Laceration   HPI  Derrick Barajas is a 66 y.o. male   presents to the ED via EMS from home.  Patient states that he was walking barefooted taking the trash out when a piece of glass in the bag apparently was on the ground and he stepped on it.  Patient states he was able to remove all the glass as it was not a large piece and did not even know that he had a laceration until he got back in the home.  Patient is unaware of his last tetanus update.  Patient has a history of diabetes, CAD, CHF, hypertension, CVA, thyroid disease.      Physical Exam   Triage Vital Signs: ED Triage Vitals  Enc Vitals Group     BP 12/01/21 0858 120/79     Pulse Rate 12/01/21 0858 85     Resp 12/01/21 0858 17     Temp 12/01/21 0858 97.6 F (36.4 C)     Temp Source 12/01/21 0858 Oral     SpO2 12/01/21 0858 98 %     Weight 12/01/21 0904 280 lb (127 kg)     Height 12/01/21 0904 5\' 7"  (1.702 m)     Head Circumference --      Peak Flow --      Pain Score 12/01/21 0904 3     Pain Loc --      Pain Edu? --      Excl. in GC? --     Most recent vital signs: Vitals:   12/01/21 0858  BP: 120/79  Pulse: 85  Resp: 17  Temp: 97.6 F (36.4 C)  SpO2: 98%     General: Awake, no distress.  CV:  Good peripheral perfusion.  Resp:  Normal effort.  Abd:  No distention.  Other:  Left foot medial side plantar aspect there is a 2 cm superficial flap laceration without active bleeding.  Patient is able to move great toe without any difficulty and no change with sensory motor.  Area is clean.   ED Results / Procedures / Treatments   Labs (all labs ordered are listed, but only abnormal results are displayed) Labs Reviewed - No data to display    PROCEDURES:  Critical Care performed:   Marland KitchenMarland KitchenLaceration Repair  Date/Time: 12/01/2021 10:11  AM Performed by: Tommi Rumps, PA-C Authorized by: Tommi Rumps, PA-C   Consent:    Consent obtained:  Verbal   Consent given by:  Patient   Risks discussed:  Infection, pain and poor wound healing Universal protocol:    Patient identity confirmed:  Verbally with patient Anesthesia:    Anesthesia method:  Local infiltration   Local anesthetic:  Lidocaine 1% w/o epi Laceration details:    Location:  Foot   Foot location:  Sole of L foot   Length (cm):  2 Pre-procedure details:    Preparation:  Patient was prepped and draped in usual sterile fashion Exploration:    Limited defect created (wound extended): no     Hemostasis achieved with:  Direct pressure Treatment:    Area cleansed with:  Saline and povidone-iodine   Amount of cleaning:  Standard   Irrigation solution:  Sterile saline   Irrigation volume:  60 ml   Irrigation method:  Syringe and tap  Visualized foreign bodies/material removed: no     Debridement:  None Skin repair:    Repair method:  Sutures   Suture size:  4-0   Suture technique:  Simple interrupted   Number of sutures:  2 Approximation:    Approximation:  Close Repair type:    Repair type:  Simple Post-procedure details:    Dressing:  Non-adherent dressing   Procedure completion:  Tolerated well, no immediate complications   MEDICATIONS ORDERED IN ED: Medications  lidocaine (PF) (XYLOCAINE) 1 % injection 5 mL (has no administration in time range)  Tdap (BOOSTRIX) injection 0.5 mL (0.5 mLs Intramuscular Given 12/01/21 0937)  lidocaine (PF) (XYLOCAINE) 1 % injection 5 mL (5 mLs Infiltration Given 12/01/21 0953)     IMPRESSION / MDM / ASSESSMENT AND PLAN / ED COURSE  I reviewed the triage vital signs and the nursing notes.   Differential diagnosis includes, but is not limited to, laceration left foot, contusion left foot.  66 year old male presents to the ED via EMS after he stepped on a piece of glass while he was barefooted taking his  garbage out.  There is no active bleeding and the area was superficial.  Area was cleaned and patient was made aware that he would need some sutures.  We also discussed taking antibiotics as he is diabetic to keep this from getting infected.  Patient tolerated procedure well.  He is encouraged to follow-up with his PCP or urgent care to have his sutures removed.  He is to return to the emergency department over the holiday weekend if any signs of infection.     FINAL CLINICAL IMPRESSION(S) / ED DIAGNOSES   Final diagnoses:  Laceration of left foot, initial encounter     Rx / DC Orders   ED Discharge Orders          Ordered    cephALEXin (KEFLEX) 500 MG capsule  3 times daily        12/01/21 1033             Note:  This document was prepared using Dragon voice recognition software and may include unintentional dictation errors.   Tommi Rumps, PA-C 12/01/21 1434    Arnaldo Natal, MD 12/01/21 1534

## 2021-12-01 NOTE — ED Triage Notes (Signed)
Presents via EMS from home    states he was taking out the trash which had glass in the bag   laceration to left foot   laceration is approx 1in and  bleeding is controlled at present

## 2021-12-01 NOTE — Discharge Instructions (Signed)
Begin taking Keflex 500 mg 3 times daily for 7 days to prevent infection and clean the area twice a day with mild soap and water and allowed to dry completely.  Sutures should be removed in approximately 10 to 12 days.  This can be done at your doctor's office or urgent care.  You may take Tylenol for pain if needed.  Do not walk inside the house without shoes on.

## 2021-12-06 ENCOUNTER — Ambulatory Visit: Payer: Self-pay

## 2021-12-06 NOTE — Telephone Encounter (Signed)
  Chief Complaint: Pain and lump at injection site of tetanus hot Symptoms: pain and lump  Frequency: since 12/01/2021 Pertinent Negatives: Patient denies fever redness Disposition: [] ED /[] Urgent Care (no appt availability in office) / [] Appointment(In office/virtual)/ []  [facility]/ [] Home Care/ [] Refused Recommended Disposition /[] [place] [facility]/ [x]  Follow-up with PCP Additional Notes: PT received a tetanus shot on 5/26 and is still experiencing localized pain, and lump at site. Pt was seen at [facility] yesterday for same, but wanted another opinion.  Reason for Disposition  Tetanus-diphtheria-pertussis (Tdap) vaccine reactions  Tetanus-diphtheria (Td) vaccine reactions  Answer Assessment - Initial Assessment Questions 1. SYMPTOMS: "What is the main symptom?" (e.g., redness, swelling, pain)      Knot and soreness 2. ONSET: "When was the vaccine (shot) given?" "How much later did the *No Answer* begin?" (e.g., hours, days ago)      12/01/2021 3. SEVERITY: "How bad is it?"      Sore and lump 4. FEVER: "Is there a fever?" If Yes, ask: "What is it, how was it measured, and when did it start?"      no 5. IMMUNIZATIONS GIVEN: "What shots have you recently received?"     tetanus 6. PAST REACTIONS: "Have you reacted to immunizations before?" If Yes, ask: "What happened?"     no 7. OTHER SYMPTOMS: "Do you have any other symptoms?"     no  Protocols used: Immunization Reactions-A-AH

## 2021-12-13 DIAGNOSIS — Z794 Long term (current) use of insulin: Secondary | ICD-10-CM | POA: Diagnosis not present

## 2021-12-13 DIAGNOSIS — W25XXXA Contact with sharp glass, initial encounter: Secondary | ICD-10-CM | POA: Diagnosis not present

## 2021-12-13 DIAGNOSIS — E782 Mixed hyperlipidemia: Secondary | ICD-10-CM | POA: Diagnosis not present

## 2021-12-13 DIAGNOSIS — E063 Autoimmune thyroiditis: Secondary | ICD-10-CM | POA: Diagnosis not present

## 2021-12-13 DIAGNOSIS — E119 Type 2 diabetes mellitus without complications: Secondary | ICD-10-CM | POA: Diagnosis not present

## 2021-12-13 DIAGNOSIS — E1165 Type 2 diabetes mellitus with hyperglycemia: Secondary | ICD-10-CM | POA: Diagnosis not present

## 2021-12-13 DIAGNOSIS — S91312A Laceration without foreign body, left foot, initial encounter: Secondary | ICD-10-CM | POA: Diagnosis not present

## 2021-12-21 DIAGNOSIS — E1165 Type 2 diabetes mellitus with hyperglycemia: Secondary | ICD-10-CM | POA: Diagnosis not present

## 2021-12-21 DIAGNOSIS — Z794 Long term (current) use of insulin: Secondary | ICD-10-CM | POA: Diagnosis not present

## 2021-12-21 DIAGNOSIS — E1169 Type 2 diabetes mellitus with other specified complication: Secondary | ICD-10-CM | POA: Diagnosis not present

## 2021-12-21 DIAGNOSIS — E1159 Type 2 diabetes mellitus with other circulatory complications: Secondary | ICD-10-CM | POA: Diagnosis not present

## 2021-12-21 DIAGNOSIS — E063 Autoimmune thyroiditis: Secondary | ICD-10-CM | POA: Diagnosis not present

## 2021-12-21 DIAGNOSIS — I739 Peripheral vascular disease, unspecified: Secondary | ICD-10-CM | POA: Diagnosis not present

## 2022-01-05 DIAGNOSIS — I129 Hypertensive chronic kidney disease with stage 1 through stage 4 chronic kidney disease, or unspecified chronic kidney disease: Secondary | ICD-10-CM | POA: Diagnosis not present

## 2022-01-05 DIAGNOSIS — N189 Chronic kidney disease, unspecified: Secondary | ICD-10-CM | POA: Diagnosis not present

## 2022-01-05 DIAGNOSIS — E782 Mixed hyperlipidemia: Secondary | ICD-10-CM | POA: Diagnosis not present

## 2022-01-15 DIAGNOSIS — I509 Heart failure, unspecified: Secondary | ICD-10-CM | POA: Diagnosis not present

## 2022-01-15 DIAGNOSIS — I10 Essential (primary) hypertension: Secondary | ICD-10-CM | POA: Diagnosis not present

## 2022-01-15 DIAGNOSIS — N189 Chronic kidney disease, unspecified: Secondary | ICD-10-CM | POA: Diagnosis not present

## 2022-01-15 DIAGNOSIS — M109 Gout, unspecified: Secondary | ICD-10-CM | POA: Diagnosis not present

## 2022-01-15 DIAGNOSIS — E119 Type 2 diabetes mellitus without complications: Secondary | ICD-10-CM | POA: Diagnosis not present

## 2022-01-26 DIAGNOSIS — E039 Hypothyroidism, unspecified: Secondary | ICD-10-CM | POA: Diagnosis not present

## 2022-01-26 DIAGNOSIS — E119 Type 2 diabetes mellitus without complications: Secondary | ICD-10-CM | POA: Diagnosis not present

## 2022-01-26 DIAGNOSIS — M109 Gout, unspecified: Secondary | ICD-10-CM | POA: Diagnosis not present

## 2022-01-28 NOTE — Progress Notes (Deleted)
MRN : [id number]  Derrick Barajas is a 66 y.o. (Jul 06, 1956) male who presents with chief complaint of legs swell.  History of Present Illness:   The patient returns to the office for followup evaluation regarding leg swelling.  The swelling has improved quite a bit and the pain associated with swelling has decreased substantially. There have not been any interval development of a ulcerations or wounds.   Since the previous visit the patient has been wearing graduated compression stockings and has noted some improvement in the lymphedema. The patient has been using compression routinely morning until night.   The patient also states elevation during the day and exercise (such as walking) is being done too.    No outpatient medications have been marked as taking for the 01/29/22 encounter (Appointment) with Gilda Crease, Latina Craver, MD.    Past Medical History:  Diagnosis Date   CAD (coronary artery disease)    CHF (congestive heart failure) (HCC)    Diabetes mellitus without complication (HCC)    Gout    Hyperlipidemia    Hypertension    MI (myocardial infarction) (HCC)    RA (rheumatoid arthritis) (HCC)    Sleep apnea    Stroke (HCC)    left facal droop   Thyroid disease     Past Surgical History:  Procedure Laterality Date   CIRCUMCISION     CORONARY ANGIOPLASTY WITH STENT PLACEMENT     CORONARY STENT INTERVENTION N/A 11/17/2018   Procedure: CORONARY STENT INTERVENTION;  Surgeon: Alwyn Pea, MD;  Location: [facility] INVASIVE CV LAB;  Service: Cardiovascular;  Laterality: N/A;   LEFT HEART CATH AND CORONARY ANGIOGRAPHY Left 11/17/2018   Procedure: LEFT HEART CATH AND CORONARY ANGIOGRAPHY;  Surgeon: Laurier Nancy, MD;  Location: [facility] INVASIVE CV LAB;  Service: Cardiovascular;  Laterality: Left;   TONSILLECTOMY      Social History Social History   Tobacco Use   Smoking status: Never   Smokeless tobacco: Never  Vaping Use   Vaping Use: Never used  Substance Use  Topics   Alcohol use: No    Alcohol/week: 0.0 standard drinks of alcohol   Drug use: No    Family History Family History  Problem Relation Age of Onset   Diabetes Mother    Diabetes Brother     Allergies  Allergen Reactions   Shellfish-Derived Products Other (See Comments)    Other reaction(s): Nausea/Vomiting/Diarrhea, Shock/Unconsciousness   Sulfa Antibiotics Other (See Comments)    Other reaction(s): Nausea/Vomiting/Diarrhea, Shock/Unconsciousness   Iodine Nausea And Vomiting   Atorvastatin Other (See Comments)   Iodinated Contrast Media Other (See Comments)   Nsaids Other (See Comments)    D/t cardiac meds   Shrimp [Shellfish Allergy] Nausea And Vomiting     REVIEW OF SYSTEMS (Negative unless checked)  Constitutional: [] Weight loss  [] Fever  [] Chills Cardiac: [] Chest pain   [] Chest pressure   [] Palpitations   [] Shortness of breath when laying flat   [] Shortness of breath with exertion. Vascular:  [] Pain in legs with walking   [x] Pain in legs with standing  [] History of DVT   [] Phlebitis   [x] Swelling in legs   [] Varicose veins   [] Non-healing ulcers Pulmonary:   [] Uses home oxygen   [] Productive cough   [] Hemoptysis   [] Wheeze  [] COPD   [] Asthma Neurologic:  [] Dizziness   [] Seizures   [] History of stroke   [] History of TIA  [] Aphasia   [] Vissual changes   [] Weakness or numbness in arm   []   Weakness or numbness in leg Musculoskeletal:   [] Joint swelling   [] Joint pain   [] Low back pain Hematologic:  [] Easy bruising  [] Easy bleeding   [] Hypercoagulable state   [] Anemic Gastrointestinal:  [] Diarrhea   [] Vomiting  [] Gastroesophageal reflux/heartburn   [] Difficulty swallowing. Genitourinary:  [] Chronic kidney disease   [] Difficult urination  [] Frequent urination   [] Blood in urine Skin:  [] Rashes   [] Ulcers  Psychological:  [] History of anxiety   []  History of major depression.  Physical Examination  There were no vitals filed for this visit. There is no height or weight on  file to calculate BMI. Gen: WD/WN, NAD Head: [place]/AT, No temporalis wasting.  Ear/Nose/Throat: Hearing grossly intact, nares w/o erythema or drainage, pinna without lesions Eyes: PER, EOMI, sclera nonicteric.  Neck: Supple, no gross masses.  No JVD.  Pulmonary:  Good air movement, no audible wheezing, no use of accessory muscles.  Cardiac: RRR, precordium not hyperdynamic. Vascular:  scattered varicosities present bilaterally.  Mild venous stasis changes to the legs bilaterally.  3-4+ soft pitting edema  Vessel Right Left  Radial Palpable Palpable  Gastrointestinal: soft, non-distended. No guarding/no peritoneal signs.  Musculoskeletal: M/S 5/5 throughout.  No deformity.  Neurologic: CN 2-12 intact. Pain and light touch intact in extremities.  Symmetrical.  Speech is fluent. Motor exam as listed above. Psychiatric: Judgment intact, Mood & affect appropriate for pt's clinical situation. Dermatologic: Venous rashes no ulcers noted.  No changes consistent with cellulitis. Lymph : No lichenification or skin changes of chronic lymphedema.  CBC Lab Results  Component Value Date   WBC 5.6 07/13/2020   HGB 14.0 07/13/2020   HCT 43.9 07/13/2020   MCV 91.6 07/13/2020   PLT 235 07/13/2020    BMET    Component Value Date/Time   NA 140 07/13/2020 1450   NA 140 05/03/2014 1044   K 4.2 07/13/2020 1450   K 4.2 05/03/2014 1044   CL 104 07/13/2020 1450   CL 107 05/03/2014 1044   CO2 27 07/13/2020 1450   CO2 27 05/03/2014 1044   GLUCOSE 118 (H) 07/13/2020 1450   GLUCOSE 155 (H) 05/03/2014 1044   BUN 21 07/13/2020 1450   BUN 13 05/03/2014 1044   CREATININE 1.34 (H) 07/13/2020 1450   CREATININE 1.15 05/03/2014 1044   CALCIUM 8.9 07/13/2020 1450   CALCIUM 8.4 (L) 05/03/2014 1044   GFRNONAA 59 (L) 07/13/2020 1450   GFRNONAA >60 05/03/2014 1044   GFRNONAA >60 09/23/2012 1007   GFRAA >60 11/18/2018 0352   GFRAA >60 05/03/2014 1044   GFRAA >60 09/23/2012 1007   CrCl cannot be calculated  (Patient's most recent lab result is older than the maximum 21 days allowed.).  COAG Lab Results  Component Value Date   INR 0.95 02/04/2017   INR 1.02 02/09/2015   INR 0.9 08/28/2011    Radiology No results found.   Assessment/Plan There are no diagnoses linked to this encounter.   09/10/2020, MD  01/28/2022 12:59 PM

## 2022-01-29 ENCOUNTER — Ambulatory Visit (INDEPENDENT_AMBULATORY_CARE_PROVIDER_SITE_OTHER): Payer: Commercial Managed Care - HMO | Admitting: Vascular Surgery

## 2022-01-29 DIAGNOSIS — I10 Essential (primary) hypertension: Secondary | ICD-10-CM | POA: Diagnosis not present

## 2022-01-29 DIAGNOSIS — E119 Type 2 diabetes mellitus without complications: Secondary | ICD-10-CM | POA: Diagnosis not present

## 2022-01-29 DIAGNOSIS — M109 Gout, unspecified: Secondary | ICD-10-CM | POA: Diagnosis not present

## 2022-01-29 DIAGNOSIS — E039 Hypothyroidism, unspecified: Secondary | ICD-10-CM | POA: Diagnosis not present

## 2022-01-29 DIAGNOSIS — N189 Chronic kidney disease, unspecified: Secondary | ICD-10-CM | POA: Diagnosis not present

## 2022-02-01 DIAGNOSIS — I10 Essential (primary) hypertension: Secondary | ICD-10-CM | POA: Diagnosis not present

## 2022-02-01 DIAGNOSIS — E782 Mixed hyperlipidemia: Secondary | ICD-10-CM | POA: Diagnosis not present

## 2022-02-01 DIAGNOSIS — J45998 Other asthma: Secondary | ICD-10-CM | POA: Diagnosis not present

## 2022-02-01 DIAGNOSIS — I351 Nonrheumatic aortic (valve) insufficiency: Secondary | ICD-10-CM | POA: Diagnosis not present

## 2022-02-01 DIAGNOSIS — E119 Type 2 diabetes mellitus without complications: Secondary | ICD-10-CM | POA: Diagnosis not present

## 2022-02-01 DIAGNOSIS — I219 Acute myocardial infarction, unspecified: Secondary | ICD-10-CM | POA: Diagnosis not present

## 2022-02-01 DIAGNOSIS — I340 Nonrheumatic mitral (valve) insufficiency: Secondary | ICD-10-CM | POA: Diagnosis not present

## 2022-02-01 DIAGNOSIS — I2510 Atherosclerotic heart disease of native coronary artery without angina pectoris: Secondary | ICD-10-CM | POA: Diagnosis not present

## 2022-02-06 ENCOUNTER — Other Ambulatory Visit: Payer: Self-pay | Admitting: *Deleted

## 2022-02-06 NOTE — Patient Outreach (Signed)
  Care Coordination   Initial Visit Note   02/06/2022 Name: Derrick Barajas MRN: [id number] DOB: 1955-12-01  Derrick Barajas is a 66 y.o. year old male who sees Margaretann Loveless, MD for primary care. I spoke with  [name] by phone today  What matters to the patients health and wellness today? Patient states he wants to get his diabetes under better control.   Goals Addressed             This Visit's Progress    Patient Stated       Patient states he wants to improve his A1c and get his diabetes under better control.        SDOH assessments and interventions completed:   Yes   Care Coordination Interventions Activated:  Yes Care Coordination Interventions:  No, not indicated  Follow up plan: Follow up call scheduled for 03/15/22 @ 1000  Encounter Outcome:  Pt. Visit Completed  Blanchie Serve RN, BSN [facility] Care Management [facility] [phone] [email or web address]

## 2022-02-16 NOTE — Progress Notes (Signed)
MRN : [id number]  Derrick Barajas is a 66 y.o. (01-27-1956) male who presents with chief complaint of legs swell.  History of Present Illness:   The patient returns to the office for followup evaluation regarding leg swelling.  The swelling has improved quite a bit and the pain associated with swelling has decreased substantially. There have not been any interval development of a ulcerations or wounds.   Since the previous visit the patient has been wearing graduated compression stockings and has noted some improvement in the lymphedema. The patient has been using compression routinely morning until night.   The patient also states elevation during the day and exercise (such as walking) is being done too.  No outpatient medications have been marked as taking for the 02/19/22 encounter (Appointment) with Gilda Crease, Latina Craver, MD.    Past Medical History:  Diagnosis Date   CAD (coronary artery disease)    CHF (congestive heart failure) (HCC)    Diabetes mellitus without complication (HCC)    Gout    Hyperlipidemia    Hypertension    MI (myocardial infarction) (HCC)    RA (rheumatoid arthritis) (HCC)    Sleep apnea    Stroke (HCC)    left facal droop   Thyroid disease     Past Surgical History:  Procedure Laterality Date   CIRCUMCISION     CORONARY ANGIOPLASTY WITH STENT PLACEMENT     CORONARY STENT INTERVENTION N/A 11/17/2018   Procedure: CORONARY STENT INTERVENTION;  Surgeon: Alwyn Pea, MD;  Location: [facility] INVASIVE CV [facility];  Service: Cardiovascular;  Laterality: N/A;   LEFT HEART CATH AND CORONARY ANGIOGRAPHY Left 11/17/2018   Procedure: LEFT HEART CATH AND CORONARY ANGIOGRAPHY;  Surgeon: Laurier Nancy, MD;  Location: [facility] INV[facility] CV [facility];  Service: Cardiovascular;  Laterality: Left;   TONSILLECTOMY      Social History Social History   Tobacco Use   Smoking status: Never   Smokeless tobacco: Never  Vaping Use   Vaping Use: Never used  Substance Use Topics    Alcohol use: No    Alcohol/week: 0.0 standard drinks of alcohol   Drug use: No    Family History Family History  Problem Relation Age of Onset   Diabetes Mother    Diabetes Brother     Allergies  Allergen Reactions   Shellfish-Derived Products Other (See Comments)    Other reaction(s): Nausea/Vomiting/Diarrhea, Shock/Unconsciousness   Sulfa Antibiotics Other (See Comments)    Other reaction(s): Nausea/Vomiting/Diarrhea, Shock/Unconsciousness   Iodine Nausea And Vomiting   Atorvastatin Other (See Comments)   Iodinated Contrast Media Other (See Comments)   Nsaids Other (See Comments)    D/t cardiac meds   Shrimp [Shellfish Allergy] Nausea And Vomiting     REVIEW OF SYSTEMS (Negative unless checked)  Constitutional: [] Weight loss  [] Fever  [] Chills Cardiac: [] Chest pain   [] Chest pressure   [] Palpitations   [] Shortness of breath when laying flat   [] Shortness of breath with exertion. Vascular:  [] Pain in legs with walking   [x] Pain in legs with standing  [] History of DVT   [] Phlebitis   [x] Swelling in legs   [] Varicose veins   [] Non-healing ulcers Pulmonary:   [] Uses home oxygen   [] Productive cough   [] Hemoptysis   [] Wheeze  [] COPD   [] Asthma Neurologic:  [] Dizziness   [] Seizures   [] History of stroke   [] History of TIA  [] Aphasia   [] Vissual changes   [] Weakness or numbness in arm   [] Weakness  or numbness in leg Musculoskeletal:   [] Joint swelling   [] Joint pain   [] Low back pain Hematologic:  [] Easy bruising  [] Easy bleeding   [] Hypercoagulable state   [] Anemic Gastrointestinal:  [] Diarrhea   [] Vomiting  [] Gastroesophageal reflux/heartburn   [] Difficulty swallowing. Genitourinary:  [] Chronic kidney disease   [] Difficult urination  [] Frequent urination   [] Blood in urine Skin:  [] Rashes   [] Ulcers  Psychological:  [] History of anxiety   []  History of major depression.  Physical Examination  There were no vitals filed for this visit. There is no height or weight on file  to calculate BMI. Gen: WD/WN, NAD Head: [place]/AT, No temporalis wasting.  Ear/Nose/Throat: Hearing grossly intact, nares w/o erythema or drainage, pinna without lesions Eyes: PER, EOMI, sclera nonicteric.  Neck: Supple, no gross masses.  No JVD.  Pulmonary:  Good air movement, no audible wheezing, no use of accessory muscles.  Cardiac: RRR, precordium not hyperdynamic. Vascular:  scattered varicosities present bilaterally.  Mild venous stasis changes to the legs bilaterally.  2+ soft pitting edema  Vessel Right Left  Radial Palpable Palpable  Gastrointestinal: soft, non-distended. No guarding/no peritoneal signs.  Musculoskeletal: M/S 5/5 throughout.  No deformity.  Neurologic: CN 2-12 intact. Pain and light touch intact in extremities.  Symmetrical.  Speech is fluent. Motor exam as listed above. Psychiatric: Judgment intact, Mood & affect appropriate for pt's clinical situation. Dermatologic: Venous rashes no ulcers noted.  No changes consistent with cellulitis. Lymph : No lichenification or skin changes of chronic lymphedema.  CBC Lab Results  Component Value Date   WBC 5.6 07/13/2020   HGB 14.0 07/13/2020   HCT 43.9 07/13/2020   MCV 91.6 07/13/2020   PLT 235 07/13/2020    BMET    Component Value Date/Time   NA 140 07/13/2020 1450   NA 140 05/03/2014 1044   K 4.2 07/13/2020 1450   K 4.2 05/03/2014 1044   CL 104 07/13/2020 1450   CL 107 05/03/2014 1044   CO2 27 07/13/2020 1450   CO2 27 05/03/2014 1044   GLUCOSE 118 (H) 07/13/2020 1450   GLUCOSE 155 (H) 05/03/2014 1044   BUN 21 07/13/2020 1450   BUN 13 05/03/2014 1044   CREATININE 1.34 (H) 07/13/2020 1450   CREATININE 1.15 05/03/2014 1044   CALCIUM 8.9 07/13/2020 1450   CALCIUM 8.4 (L) 05/03/2014 1044   GFRNONAA 59 (L) 07/13/2020 1450   GFRNONAA >60 05/03/2014 1044   GFRNONAA >60 09/23/2012 1007   GFRAA >60 11/18/2018 0352   GFRAA >60 05/03/2014 1044   GFRAA >60 09/23/2012 1007   CrCl cannot be calculated (Patient's  most recent lab result is older than the maximum 21 days allowed.).  COAG Lab Results  Component Value Date   INR 0.95 02/04/2017   INR 1.02 02/09/2015   INR 0.9 08/28/2011    Radiology No results found.   Assessment/Plan 1. Lymphedema Recommend:  No surgery or intervention at this point in time.  I have reviewed my discussion with the patient regarding venous insufficiency and why it causes symptoms. I have discussed with the patient the chronic skin changes that accompany venous insufficiency and the long term sequela such as ulceration. Patient will contnue wearing graduated compression stockings on a daily basis, as this has provided excellent control of his edema. The patient will put the stockings on first thing in the morning and removing them in the evening. The patient is reminded not to sleep in the stockings.  In addition, behavioral modification including elevation  during the day will be initiated. Exercise is strongly encouraged.  Previous duplex ultrasound of the lower extremities shows normal deep system, no significant superficial reflux was identified.  Given the patient's good control and lack of any problems regarding the venous insufficiency and lymphedema a lymph pump in not need at this time.    The patient will follow up with me PRN should anything change.  The patient voices agreement with this plan.   2. Type 2 diabetes mellitus without complication, unspecified whether long term insulin use (HCC) Continue hypoglycemic medications as already ordered, these medications have been reviewed and there are no changes at this time.  Hgb A1C to be monitored as already arranged by primary service   3. Hyperlipidemia, mixed Continue statin as ordered and reviewed, no changes at this time   4. Hypertension, unspecified type Continue antihypertensive medications as already ordered, these medications have been reviewed and there are no changes at this time.   5.  Myocardial infarction, unspecified MI type, unspecified artery (HCC) Continue cardiac and antihypertensive medications as already ordered and reviewed, no changes at this time.  Continue statin as ordered and reviewed, no changes at this time  Nitrates PRN for chest pain     Levora Dredge, MD  02/16/2022 2:58 PM

## 2022-02-19 ENCOUNTER — Ambulatory Visit (INDEPENDENT_AMBULATORY_CARE_PROVIDER_SITE_OTHER): Payer: Medicare Other | Admitting: Vascular Surgery

## 2022-02-19 ENCOUNTER — Encounter (INDEPENDENT_AMBULATORY_CARE_PROVIDER_SITE_OTHER): Payer: Self-pay | Admitting: Vascular Surgery

## 2022-02-19 VITALS — BP 119/74 | HR 85 | Resp 16 | Wt 281.8 lb

## 2022-02-19 DIAGNOSIS — I219 Acute myocardial infarction, unspecified: Secondary | ICD-10-CM | POA: Diagnosis not present

## 2022-02-19 DIAGNOSIS — E119 Type 2 diabetes mellitus without complications: Secondary | ICD-10-CM

## 2022-02-19 DIAGNOSIS — E782 Mixed hyperlipidemia: Secondary | ICD-10-CM

## 2022-02-19 DIAGNOSIS — I890 Lymphedema, not elsewhere classified: Secondary | ICD-10-CM | POA: Diagnosis not present

## 2022-02-19 DIAGNOSIS — I10 Essential (primary) hypertension: Secondary | ICD-10-CM

## 2022-02-23 ENCOUNTER — Emergency Department
Admission: EM | Admit: 2022-02-23 | Discharge: 2022-02-23 | Disposition: A | Discharge status: Home / Self Care | Payer: Medicare Other | Attending: Emergency Medicine | Admitting: Emergency Medicine

## 2022-02-23 ENCOUNTER — Other Ambulatory Visit: Payer: Self-pay

## 2022-02-23 DIAGNOSIS — X58XXXA Exposure to other specified factors, initial encounter: Secondary | ICD-10-CM | POA: Insufficient documentation

## 2022-02-23 DIAGNOSIS — I110 Hypertensive heart disease with heart failure: Secondary | ICD-10-CM | POA: Diagnosis not present

## 2022-02-23 DIAGNOSIS — E119 Type 2 diabetes mellitus without complications: Secondary | ICD-10-CM | POA: Insufficient documentation

## 2022-02-23 DIAGNOSIS — S30812A Abrasion of penis, initial encounter: Secondary | ICD-10-CM | POA: Insufficient documentation

## 2022-02-23 DIAGNOSIS — B351 Tinea unguium: Secondary | ICD-10-CM | POA: Diagnosis not present

## 2022-02-23 DIAGNOSIS — I509 Heart failure, unspecified: Secondary | ICD-10-CM | POA: Insufficient documentation

## 2022-02-23 DIAGNOSIS — S3994XA Unspecified injury of external genitals, initial encounter: Secondary | ICD-10-CM | POA: Diagnosis present

## 2022-02-23 DIAGNOSIS — E1142 Type 2 diabetes mellitus with diabetic polyneuropathy: Secondary | ICD-10-CM | POA: Diagnosis not present

## 2022-02-23 LAB — URINALYSIS, ROUTINE W REFLEX MICROSCOPIC
Bacteria, UA: NONE SEEN
Bilirubin Urine: NEGATIVE
Glucose, UA: >=500 mg/dL — AB
Ketones, ur: NEGATIVE mg/dL
Leukocytes,Ua: NEGATIVE
Nitrite: NEGATIVE
Protein, ur: NEGATIVE mg/dL
RBC / HPF: >50 RBC/hpf — ABNORMAL HIGH (ref 0–5)
Specific Gravity, Urine: 1.026 (ref 1.005–1.030)
pH: 5 (ref 5.0–8.0)

## 2022-02-23 LAB — CHLAMYDIA/NGC RT PCR (ARMC ONLY)
Chlamydia Tr: NOT DETECTED
N gonorrhoeae: NOT DETECTED

## 2022-02-23 MED ORDER — LIDOCAINE 5 % EX OINT
1.0000 | TOPICAL_OINTMENT | CUTANEOUS | 0 refills | Status: DC | PRN
Start: 2022-02-23 — End: 2022-10-03

## 2022-02-23 NOTE — ED Provider Notes (Signed)
[facility] Provider Note    Event Date/Time   First MD Initiated Contact with Patient 02/23/22 [phone]     (approximate)   History   Penis Problem    HPI  Derrick Barajas is a 66 y.o. male with history of diabetes, RA, CHF, hypertension presents emergency department with complaints of a painful area on his penis.  Is embarrassed to say that he did have unprotected sex with someone.  No drainage.  This area is very painful.      Physical Exam   Triage Vital Signs: ED Triage Vitals  Enc Vitals Group     BP 02/23/22 0943 132/74     Pulse Rate 02/23/22 0943 77     Resp 02/23/22 0943 17     Temp 02/23/22 0943 98 F (36.7 C)     Temp Source 02/23/22 0943 Oral     SpO2 02/23/22 0943 94 %     Weight 02/23/22 0949 281 lb 12 oz (127.8 kg)     Height 02/23/22 0949 5\' 7"  (1.702 m)     Head Circumference --      Peak Flow --      Pain Score 02/23/22 0943 4     Pain Loc --      Pain Edu? --      Excl. in GC? --     Most recent vital signs: Vitals:   02/23/22 0943  BP: 132/74  Pulse: 77  Resp: 17  Temp: 98 F (36.7 C)  SpO2: 94%     General: Awake, no distress.   CV:  Good peripheral perfusion. regular rate and  rhythm Resp:  Normal effort.  Abd:  No distention.   Other:  Penis with a open sore noted near the urethra, does not appear to be herpetic, no obvious drainage   ED Results / Procedures / Treatments   Labs (all labs ordered are listed, but only abnormal results are displayed) Labs Reviewed  URINALYSIS, ROUTINE W REFLEX MICROSCOPIC - Abnormal; Notable for the following components:      Result Value   Color, Urine YELLOW (*)    APPearance HAZY (*)    Glucose, UA >=500 (*)    Hgb urine dipstick LARGE (*)    RBC / HPF >50 (*)    All other components within normal limits  CHLAMYDIA/NGC RT PCR ([facility] ONLY)            RPR     EKG     RADIOLOGY     PROCEDURES:   Procedures   MEDICATIONS ORDERED IN ED: Medications -  No data to display   IMPRESSION / MDM / ASSESSMENT AND PLAN / ED COURSE  I reviewed the triage vital signs and the nursing notes.                              Differential diagnosis includes, but is not limited to, gonorrhea, chlamydia, syphilis, herpes, balanitis  Patient's presentation is most consistent with acute complicated illness / injury requiring diagnostic workup.   Due to the open sore has concerns of unprotected sex in the high incidence of syphilis in the area, we will do a RPR.  GC/chlamydia test ordered.  Plan at this time is to provide lidocaine to the tip of the penis for pain.  GC/chlamydia test negative, urinalysis shows blood  Explained these findings to the patient.  Feel that he has more  of an abrasion than STD.  He was given a prescription for lidocaine ointment.  He is to apply Vaseline or Aquaphor to lubricate the area.  If his RPR is positive someone will call him.  He is in agreement treatment plan.  Follow-up with his regular doctor.  Is discharged stable condition.     FINAL CLINICAL IMPRESSION(S) / ED DIAGNOSES   Final diagnoses:  Penis abrasion, initial encounter     Rx / DC Orders   ED Discharge Orders          Ordered    lidocaine (XYLOCAINE) 5 % ointment  As needed        02/23/22 1205             Note:  This document was prepared using Dragon voice recognition software and may include unintentional dictation errors.    Faythe Ghee, PA-C 02/23/22 1416    Chesley Noon, MD 02/23/22 [phone]

## 2022-02-23 NOTE — ED Triage Notes (Signed)
Pt presents to ED with c/o of having 1 sore on his penile head. Pt state she noticed it 2 days ago. Pt denies dysuria, pt denies penile discharge. Pt states unlikely an STD. Pt denies fevers or chills.

## 2022-02-23 NOTE — Discharge Instructions (Signed)
Apply vaseline or aquafor to the head of the penis to keep the area lubricated due to the abrasion Lidocaine ointment 2 to 3 times daily as needed for pain Follow up with your regular doctor if not improving in 3 days Return if worsening or if syphilis test is positive

## 2022-02-24 LAB — RPR: RPR Ser Ql: NONREACTIVE

## 2022-02-25 ENCOUNTER — Encounter (INDEPENDENT_AMBULATORY_CARE_PROVIDER_SITE_OTHER): Payer: Self-pay | Admitting: Vascular Surgery

## 2022-03-05 ENCOUNTER — Emergency Department
Admission: EM | Admit: 2022-03-05 | Discharge: 2022-03-05 | Disposition: A | Discharge status: Home / Self Care | Payer: Medicare Other | Attending: Emergency Medicine | Admitting: Emergency Medicine

## 2022-03-05 ENCOUNTER — Encounter: Payer: Self-pay | Admitting: Emergency Medicine

## 2022-03-05 ENCOUNTER — Other Ambulatory Visit: Payer: Self-pay

## 2022-03-05 DIAGNOSIS — N485 Ulcer of penis: Secondary | ICD-10-CM | POA: Diagnosis not present

## 2022-03-05 DIAGNOSIS — I509 Heart failure, unspecified: Secondary | ICD-10-CM | POA: Diagnosis not present

## 2022-03-05 DIAGNOSIS — E119 Type 2 diabetes mellitus without complications: Secondary | ICD-10-CM | POA: Diagnosis not present

## 2022-03-05 DIAGNOSIS — I110 Hypertensive heart disease with heart failure: Secondary | ICD-10-CM | POA: Insufficient documentation

## 2022-03-05 DIAGNOSIS — N489 Disorder of penis, unspecified: Secondary | ICD-10-CM

## 2022-03-05 MED ORDER — AZITHROMYCIN 500 MG PO TABS
1000.0000 mg | ORAL_TABLET | Freq: Once | ORAL | Status: AC
  Administered 2022-03-05: 1000 mg via ORAL
  Filled 2022-03-05: qty 2

## 2022-03-05 NOTE — ED Triage Notes (Signed)
Pt in with co sore to penis states for a few weeks, has been here recently for the same. STates area not improved.

## 2022-03-05 NOTE — ED Notes (Signed)
Pt states he was here for an abrasion to his penis last week. Pt states he feels like it is not getting better.

## 2022-03-05 NOTE — Discharge Instructions (Addendum)
The syphilis test is negative.  Your gonorrhea and Chlamydia tests are also negative.  We have sent a test for herpes.  If this is positive you should receive a phone call.  We suspect that this may be from an infection called chancroid.  You have been given a dose of an antibiotic in the ER to treat this.  It should improve over the next several days.  If the lesion continues and does not improve, you should follow-up with the urologist within 1 to 2 weeks.  A referral has been provided.  Return to the ER for new, worsening, or persistent sores, spreading or increase of the area of the sore, difficulty urinating, fever or chills, or any other new or worsening symptoms that concern you.

## 2022-03-05 NOTE — ED Provider Triage Note (Signed)
Emergency Medicine Provider Triage Evaluation Note  Derrick Barajas , a 66 y.o. male  was evaluated in triage.  Pt complains of sore on his penis.  Seen in the ED on 02/23/22  with GC and chlamydia negative.  Worried about STI's, had a one time episode without protection.    Review of Systems  Positive: + penile complaints Negative: No hematuria  Physical Exam  BP 121/66 (BP Location: Left Arm)   Pulse 86   Temp 98.4 F (36.9 C) (Oral)   Resp 20   Ht 5\' 7"  (1.702 m)   Wt 127 kg   SpO2 98%   BMI 43.85 kg/m  Gen:   Awake, no distress   Resp:  Normal effort  MSK:   Moves extremities without difficulty  Other:    Medical Decision Making  Medically screening exam initiated at 8:47 AM.  Appropriate orders placed.  TAIWO FISH was informed that the remainder of the evaluation will be completed by another provider, this initial triage assessment does not replace that evaluation, and the importance of remaining in the ED until their evaluation is complete.     [name], PA-C 03/05/22 [phone]

## 2022-03-05 NOTE — ED Provider Notes (Signed)
[facility] Provider Note    Event Date/Time   First MD Initiated Contact with Patient 03/05/22 1029     (approximate)   History   Sore   HPI  Derrick Barajas is a 66 y.o. male with a history of diabetes, rheumatoid arthritis, CHF, and hypertension who presents with a sore to the tip of his penis which has been present for approximately 1 to 2 weeks.  He does state he had unprotected sex previous to this.  The sore is painful but is not draining any discharge.  He has no difficulty urinating.  He denies any testicular pain or swelling.  He was seen in the ED for this last week and was given ointment with no improvement.      Physical Exam   Triage Vital Signs: ED Triage Vitals  Enc Vitals Group     BP 03/05/22 0840 121/66     Pulse Rate 03/05/22 0840 86     Resp 03/05/22 0840 20     Temp 03/05/22 0840 98.4 F (36.9 C)     Temp Source 03/05/22 0840 Oral     SpO2 03/05/22 0840 98 %     Weight 03/05/22 0841 280 lb (127 kg)     Height 03/05/22 0841 5\' 7"  (1.702 m)     Head Circumference --      Peak Flow --      Pain Score 03/05/22 0841 0     Pain Loc --      Pain Edu? --      Excl. in GC? --     Most recent vital signs: Vitals:   03/05/22 0840  BP: 121/66  Pulse: 86  Resp: 20  Temp: 98.4 F (36.9 C)  SpO2: 98%     General: Awake, no distress.  CV:  Good peripheral perfusion.  Resp:  Normal effort.  Abd:  No distention.  Other:  1 cm superficial skin ulcer to tip of penis just adjacent to penile meatus with no discharge.  Testes nontender with no masses.  No other rash or lesions.   ED Results / Procedures / Treatments   Labs (all labs ordered are listed, but only abnormal results are displayed) Labs Reviewed  RPR  HSV 1/2 PCR     EKG     RADIOLOGY    PROCEDURES:  Critical Care performed: No  Procedures   MEDICATIONS ORDERED IN ED: Medications  azithromycin (ZITHROMAX) tablet 1,000 mg (1,000 mg Oral Given  03/05/22 1148)     IMPRESSION / MDM / ASSESSMENT AND PLAN / ED COURSE  I reviewed the triage vital signs and the nursing notes.  66 year old male with PMH as noted above presents with a superficial sore to the tip of his penis.  I reviewed the past medical records.  The patient was seen on 8/18 for this which was diagnosed as a likely abrasion.  The patient was sent home with lidocaine ointment.  RPR and GC/CT sent from that visit are both negative.  On my exam there is a lesion which appears consistent with a chancre, but it is painful.  Differential diagnosis includes, but is not limited to, chancroid, herpes (although there are no vesicles) or other nonspecific skin lesion.  The patient does wear diapers so there may be irritation from urine.  There is no evidence of balanitis.  Patient's presentation is most consistent with acute complicated illness / injury requiring diagnostic workup.  A repeat RPR was sent from  triage.  I swabbed the lesion for HSV and a PCR was sent.  I will treat empirically for chancroid with azithromycin.  I gave the patient urology follow-up in case that does not resolve with this.  He is stable for discharge home.  Return precautions provided, and the patient expressed understanding.    FINAL CLINICAL IMPRESSION(S) / ED DIAGNOSES   Final diagnoses:  Penile lesion     Rx / DC Orders   ED Discharge Orders     None        Note:  This document was prepared using Dragon voice recognition software and may include unintentional dictation errors.    Dionne Bucy, MD 03/05/22 1247

## 2022-03-06 ENCOUNTER — Other Ambulatory Visit: Payer: Self-pay

## 2022-03-06 ENCOUNTER — Emergency Department
Admission: EM | Admit: 2022-03-06 | Discharge: 2022-03-06 | Disposition: A | Discharge status: Home / Self Care | Payer: Medicare Other | Attending: Emergency Medicine | Admitting: Emergency Medicine

## 2022-03-06 DIAGNOSIS — A530 Latent syphilis, unspecified as early or late: Secondary | ICD-10-CM

## 2022-03-06 DIAGNOSIS — N4889 Other specified disorders of penis: Secondary | ICD-10-CM | POA: Diagnosis present

## 2022-03-06 DIAGNOSIS — A539 Syphilis, unspecified: Secondary | ICD-10-CM

## 2022-03-06 LAB — RPR
RPR Ser Ql: REACTIVE — AB
RPR Titer: 1:2 {titer}

## 2022-03-06 LAB — T.PALLIDUM AB, TOTAL: T Pallidum Abs: REACTIVE — AB

## 2022-03-06 MED ORDER — PENICILLIN G BENZATHINE 1200000 UNIT/2ML IM SUSY
2.4000 10*6.[IU] | PREFILLED_SYRINGE | Freq: Once | INTRAMUSCULAR | Status: AC
  Administered 2022-03-06: 2.4 10*6.[IU] via INTRAMUSCULAR
  Filled 2022-03-06 (×2): qty 4

## 2022-03-06 NOTE — ED Triage Notes (Addendum)
Pt comes with c/o abnormal labs. Pt states he got a call to come and get a shot. Pt states he got meds from here yesterday.   Pt states they told him he had STD.

## 2022-03-06 NOTE — ED Notes (Signed)
See triage note  Presents for medication for abnormal lab result  denies any other sx's

## 2022-03-06 NOTE — Discharge Instructions (Signed)
Follow-up with your regular doctor as needed.  Follow-up with the [facility] department.  Notify any partners that you have had sex with that you have syphilis and they will need to be tested

## 2022-03-06 NOTE — ED Provider Notes (Signed)
   [facility] Provider Note    Event Date/Time   First MD Initiated Contact with Patient 03/06/22 1158     (approximate)   History      HPI  Derrick Barajas is a 66 y.o. male presents emergency department complaining of a lesion on his penis.  Patient was seen on 8/18 and had a negative RPR.  Was seen again on 8/28 and had a reactive RPR.  Patient is here to get shot      Physical Exam   Triage Vital Signs: ED Triage Vitals  Enc Vitals Group     BP 03/06/22 1114 133/74     Pulse Rate 03/06/22 1114 85     Resp 03/06/22 1114 20     Temp 03/06/22 1114 98.1 F (36.7 C)     Temp Source 03/06/22 1114 Oral     SpO2 03/06/22 1114 91 %     Weight 03/06/22 1152 279 lb 15.8 oz (127 kg)     Height 03/06/22 1152 5\' 7"  (1.702 m)     Head Circumference --      Peak Flow --      Pain Score 03/06/22 1120 4     Pain Loc --      Pain Edu? --      Excl. in GC? --     Most recent vital signs: Vitals:   03/06/22 1114 03/06/22 1248  BP: 133/74 129/86  Pulse: 85 88  Resp: 20 15  Temp: 98.1 F (36.7 C) 98.3 F (36.8 C)  SpO2: 91% 93%     General: Awake, no distress.   CV:  Good peripheral perfusion. regular rate and  rhythm Resp:  Normal effort.  Abd:  No distention.   Other:      ED Results / Procedures / Treatments   Labs (all labs ordered are listed, but only abnormal results are displayed) Labs Reviewed - No data to display   EKG     RADIOLOGY     PROCEDURES:   Procedures   MEDICATIONS ORDERED IN ED: Medications  penicillin g benzathine (BICILLIN LA) 1200000 UNIT/2ML injection 2.4 Million Units (2.4 Million Units Intramuscular Given 03/06/22 1205)     IMPRESSION / MDM / ASSESSMENT AND PLAN / ED COURSE  I reviewed the triage vital signs and the nursing notes.                              Differential diagnosis includes, but is not limited to, syphilis, gonorrhea, chlamydia, lab error  Patient's presentation is most  consistent with acute, uncomplicated illness.  In review the patient's old chart he had a reactive RPR.  He was given penicillin 2,400,000 units.  He is to follow-up with health department.  Patient is in agreement treatment plan.  He is notify any sexual partners.  He was discharged stable condition.      FINAL CLINICAL IMPRESSION(S) / ED DIAGNOSES   Final diagnoses:  Positive RPR test  Syphilis     Rx / DC Orders   ED Discharge Orders     None        Note:  This document was prepared using Dragon voice recognition software and may include unintentional dictation errors.    03/08/22, PA-C 03/06/22 1325    03/08/22, MD 03/06/22 1430

## 2022-03-07 ENCOUNTER — Ambulatory Visit: Payer: Medicaid Other | Admitting: Nurse Practitioner

## 2022-03-07 ENCOUNTER — Encounter: Payer: Self-pay | Admitting: Nurse Practitioner

## 2022-03-07 DIAGNOSIS — Z113 Encounter for screening for infections with a predominantly sexual mode of transmission: Secondary | ICD-10-CM | POA: Diagnosis not present

## 2022-03-07 LAB — HSV 1/2 PCR (SURFACE)
HSV-1 DNA: NOT DETECTED
HSV-2 DNA: NOT DETECTED

## 2022-03-07 LAB — HEPATITIS B SURFACE ANTIGEN: Hepatitis B Surface Ag: NONREACTIVE

## 2022-03-07 LAB — HM HEPATITIS C SCREENING LAB: HM Hepatitis Screen: NEGATIVE

## 2022-03-07 LAB — HM HIV SCREENING LAB: HM HIV Screening: NEGATIVE

## 2022-03-07 NOTE — Progress Notes (Signed)
[facility] Department STI clinic/screening visit  Subjective:  Derrick Barajas is a 66 y.o. male being seen today for an STI screening visit. The patient reports they do have symptoms.    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) 10/26/2015     Chief Complaint  Patient presents with   SEXUALLY TRANSMITTED DISEASE    Screening and treatment for syphilis. 03/06/2022 patient had a visit to the emergency room, he tested positive for syphilis  patient stated he has sore on his penis     HPI  Patient reports to clinc today for treatment of Syphilis.  Patient states that around 02/21/22 he noticed some genital itching and a lesion on the tip of his penis.  Patient reported to the hospital on 02/23/22 where he had an STD screening.   All test results were negative on 02/23/22.  On 03/05/22 he reported back to the hospital and was tested for Syphilis.  RPR results were reactive with a titer result of 1:2.  Patient was advised to follow up at the health department.  Does the patient or their partner desires a pregnancy in the next year? No  Screening for MPX risk: Does the patient have an unexplained rash? No Is the patient MSM? No Does the patient endorse multiple sex partners or anonymous sex partners? No Did the patient have close or sexual contact with a person diagnosed with MPX? No Has the patient traveled outside the [place] where MPX is endemic? No Is there a  high clinical suspicion for MPX-- evidenced by one of the following No  -Unlikely to be chickenpox  -Lymphadenopathy  -Rash that present in same phase of evolution on any given body part   See flowsheet for further details and programmatic requirements.   Immunization History  Administered Date(s) Administered   Tdap 07/10/2013, 12/01/2021     The following portions of the patient's history were reviewed and updated as appropriate: allergies, current medications, past medical history, past social history, past surgical history and problem list.  Objective:  There were no vitals filed for this visit.  Physical Exam Constitutional:      Appearance: Normal appearance.  HENT:     Head: Normocephalic. No abrasion, masses or laceration. Hair is normal.     Right Ear: External ear normal.     Left Ear: External ear normal.     Nose: Nose normal.     Mouth/Throat:     Mouth: No oral lesions.     Pharynx: No pharyngeal swelling, oropharyngeal exudate, posterior oropharyngeal erythema or uvula swelling.     Tonsils: No tonsillar exudate or tonsillar abscesses.     Comments: Poor dentition  Eyes:     General: Lids are normal.        Right eye: No discharge.        Left eye: No discharge.     Conjunctiva/sclera: Conjunctivae normal.     Right eye: No exudate.    Left eye: No exudate. Abdominal:  General: Abdomen is flat.     Palpations: Abdomen is soft.     Tenderness: There is no abdominal tenderness. There is no rebound.  Genitourinary:    Pubic Area: No rash or pubic lice.      Penis: Normal and circumcised. No erythema or discharge.      Testes: Normal.        Right: Mass or tenderness not present.        Left: Mass or tenderness not present.     Rectum: Normal.     Comments: Discharge amount: None Color: None  1 cm x 1 cm open lesion noted to the posterior head of penis.   Musculoskeletal:     Cervical back: Full passive range of motion without pain, normal range of  motion and neck supple.  Lymphadenopathy:     Cervical: No cervical adenopathy.     Right cervical: No superficial, deep or posterior cervical adenopathy.    Left cervical: No superficial, deep or posterior cervical adenopathy.     Upper Body:     Right upper body: No supraclavicular, axillary or epitrochlear adenopathy.     Left upper body: No supraclavicular, axillary or epitrochlear adenopathy.     Lower Body: No right inguinal adenopathy. No left inguinal adenopathy.  Skin:    General: Skin is warm and dry.     Findings: No lesion or rash.  Neurological:     Mental Status: He is alert and oriented to person, place, and time.  Psychiatric:        Attention and Perception: Attention normal.        Mood and Affect: Mood normal.        Speech: Speech normal.        Behavior: Behavior normal. Behavior is cooperative.       Assessment and Plan:  Derrick Barajas is a 66 y.o. male presenting to the [facility] Department for STI screening  1. Screening examination for venereal disease -66 year old male in clinic for treatment of Syphilis.  After review of notes from ED visit.  Patient was treated for Syphilis on 03/05/22 with Bicillin 2.4 million units x 1 dose.  Due to patient being symptomatic only one shot of Bicillin is needed at this time.  Screening for HIV, HBV/HCV done today.  Recommended that patient report back to clinic for a rescreen.    - HIV/HCV [place] - HBV Antigen/Antibody State Lab   Total time spent: 30 minutes   Return in about 6 months (around 09/06/2022).  Future Appointments  Date Time Provider Department Center  03/15/2022 10:00 AM Otho Ket, RN [facility] None    Glenna Fellows, FNP

## 2022-03-09 ENCOUNTER — Telehealth: Payer: Self-pay | Admitting: Family Medicine

## 2022-03-09 NOTE — Telephone Encounter (Signed)
Pt has questions about syphilis symptoms. Please call.

## 2022-03-09 NOTE — Telephone Encounter (Signed)
Spoke with patient and answered his questions regarding Syphilis.  Pt encouraged to practice safe sex and follow up in 6 months or sooner if needed.

## 2022-03-13 ENCOUNTER — Telehealth: Payer: Self-pay

## 2022-03-13 NOTE — Telephone Encounter (Signed)
     Patient  visit on 8/29  at [facility] ED   Have you been able to follow up with your primary care physician?YES  The patient was or was not able to obtain any needed medicine or equipment.YES  Are there diet recommendations that you are having difficulty following?NA  Patient expresses understanding of discharge instructions and education provided has no other needs at this time. YES     Lenard Forth [facility] Guide, [facility], Care Management  [phone] [address] [place], [place], [place] [address] Phone: [phone] Email: [email or web address]

## 2022-03-15 ENCOUNTER — Ambulatory Visit: Payer: Self-pay

## 2022-03-15 DIAGNOSIS — E119 Type 2 diabetes mellitus without complications: Secondary | ICD-10-CM

## 2022-03-15 DIAGNOSIS — Z139 Encounter for screening, unspecified: Secondary | ICD-10-CM

## 2022-03-15 NOTE — Patient Outreach (Signed)
  Care Coordination   Initial Visit Note   03/15/2022 Name: Derrick Barajas MRN: [id number] DOB: [birth year]  Derrick Barajas is a 66 y.o. year old male who sees Sherron Monday, MD for primary care. I spoke with  Derrick Barajas by phone today.  What matters to the patients health and wellness today?  Patient reports having diabetes for approximately 5 years.  He states he needs assistance managing his diabetes.  Patient reports today's fasting blood sugar was 70 and states his blood sugar ranges from 50 to 200's.  Patient reports seeing a podiatrist for foot care and states he sees his eye doctor yearly.  Patient states he occasionally will miss appointments due to not having reliable transportation    Goals Addressed             This Visit's Progress    Patient Stated: " I want to get my diabetes under better control"       Care Coordination Interventions: Discussed basic DM disease process Reviewed medications with patient and discussed importance of medication adherence Discussed plans with patient for ongoing care management follow up and provided patient with direct contact information for care management team Discussed with patient management of  hypo and hyperglycemia and importance of correct treatment Reviewed scheduled/upcoming provider appointments  Referred patient to community resource guide for transportation  Discussed Hgb A1c Mailed patient education article on managing diabetes.          SDOH assessments and interventions completed:  Yes  SDOH Interventions Today    Flowsheet Row Most Recent Value  SDOH Interventions   Food Insecurity Interventions Intervention Not Indicated  Housing Interventions Intervention Not Indicated  Transportation Interventions Other (Comment)  [refer to [employer] guide for assistance.]        Care Coordination Interventions Activated:  Yes  Care Coordination Interventions:  Yes, provided   Follow up plan: Follow up  call scheduled for 04/24/22 at 11:00 am    Encounter Outcome:  Pt. Visit Completed   George Ina RN,BSN,CCM RN Care Manager Coordinator [phone]

## 2022-03-15 NOTE — Patient Instructions (Addendum)
Visit Information  Thank you for taking time to visit with me today. Please don't hesitate to contact me if I can be of assistance to you.   Following are the goals we discussed today:   Goals Addressed             This Visit's Progress    Patient Stated: " I want to get my diabetes under better control"       Care Coordination Interventions: Discussed basic DM disease process Reviewed medications with patient and discussed importance of medication adherence Discussed plans with patient for ongoing care management follow up and provided patient with direct contact information for care management team Discussed with patient management of  hypo and hyperglycemia and importance of correct treatment Reviewed scheduled/upcoming provider appointments  Referred patient to community resource guide for transportation  Discussed Hgb A1c Mailed patient education article on managing diabetes.           Our next appointment is by telephone on 04/24/22 at 11:00 am  Please call the care guide team at [phone] if you need to cancel or reschedule your appointment.   If you are experiencing a Mental Health or Behavioral Health Crisis or need someone to talk to, please call 1-800-273-TALK (toll free, 24 hour hotline)  The patient verbalized understanding of instructions, educational materials, and care plan provided today and agreed to receive a mailed copy of patient instructions, educational materials, and care plan.   George Ina RN,BSN,CCM RN Care Manager Coordinator [phone]     Diabetes Mellitus Action Plan Following a diabetes action plan is a way for you to manage your diabetes (diabetes mellitus) symptoms. The plan is color-coded to help you understand what actions you need to take based on any symptoms you are having. If you have symptoms in the red zone, you need medical care right away. If you have symptoms in the yellow zone, you are having problems. If you have  symptoms in the Victoire Deans zone, you are doing well. Learning about and understanding diabetes can take time. Follow the plan that you develop with your health care provider. Know the target range for your blood sugar (glucose) level, and review your treatment plan with your health care provider at each visit. The target range for my blood sugar level is __________________________ mg/dL. Red zone Get medical help right away if you have any of the following symptoms: A blood sugar test result that is below 54 mg/dL (3 mmol/L). A blood sugar test result that is at or above 240 mg/dL (70.6 mmol/L) for 2 days in a row. Confusion or trouble thinking clearly. Difficulty breathing. Sickness or a fever for 2 or more days that is not getting better. Moderate or large ketone levels in your urine. Feeling tired or having no energy. If you have any red zone symptoms, do not wait to see if the symptoms will go away. Get medical help right away. Call your local emergency services (911 in the [place] Do not drive yourself to the hospital. If you have severely low blood sugar (severe hypoglycemia) and you cannot eat or drink, you may need glucagon. Make sure a family member or close friend knows how to check your blood sugar and how to give you glucagon. You may need to be treated in a hospital for this condition. Yellow zone If you have any of the following symptoms, your diabetes is not under control and you may need to make some changes: A blood sugar test result that is  at or above 240 mg/dL (57.8 mmol/L) for 2 days in a row. Blood sugar test results that are below 70 mg/dL (3.9 mmol/L). Other symptoms of hypoglycemia, such as: Shaking or feeling light-headed. Confusion or irritability. Feeling hungry. Having a fast heartbeat. If you have any yellow zone symptoms: Treat your hypoglycemia by eating or drinking 15 grams of a rapid-acting carbohydrate. Follow the 15:15 rule: Take 15 grams of a rapid-acting  carbohydrate, such as: 1 tube of glucose gel. 4 glucose pills. 4 oz (120 mL) of fruit juice. 4 oz (120 mL) of regular (not diet) soda. Check your blood sugar 15 minutes after you take the carbohydrate. If the repeat blood sugar test is still at or below 70 mg/dL (3.9 mmol/L), take 15 grams of a carbohydrate again. If your blood sugar does not increase above 70 mg/dL (3.9 mmol/L) after 3 tries, get medical help right away. After your blood sugar returns to normal, eat a meal or a snack within 1 hour. Keep taking your daily medicines as told by your health care provider. Check your blood sugar more often than you normally would. Write down your results. Call your health care provider if you have trouble keeping your blood sugar in your target range.  [place] zone These signs mean you are doing well and you can continue what you are doing to manage your diabetes: Your blood sugar is within your personal target range. For most people, a blood sugar level before a meal (preprandial) should be 80-130 mg/dL (4.6-9.6 mmol/L). You feel well, and you are able to do daily activities. If you are in the Burna Atlas zone, continue to manage your diabetes as told by your health care provider. To do this: Eat a healthy diet. Exercise regularly. Check your blood sugar as told by your health care provider. Take your medicines as told by your health care provider.  Where to find more information American Diabetes Association (ADA): diabetes.org Association of Diabetes Care & Education Specialists (ADCES): diabeteseducator.org Summary Following a diabetes action plan is a way for you to manage your diabetes symptoms. The plan is color-coded to help you understand what actions you need to take based on any symptoms you are having. Follow the plan that you develop with your health care provider. Make sure you know your personal target blood sugar level. Review your treatment plan with your health care provider at  each visit. This information is not intended to replace advice given to you by your health care provider. Make sure you discuss any questions you have with your health care provider. Document Revised: 12/31/2019 Document Reviewed: 12/31/2019 Elsevier Patient Education  2023 [employer].

## 2022-03-16 ENCOUNTER — Telehealth: Payer: Self-pay | Admitting: *Deleted

## 2022-03-16 NOTE — Telephone Encounter (Signed)
   Telephone encounter was:  Unsuccessful.  03/16/2022 Name: Derrick Barajas MRN: [id number] DOB: Nov 29, 1955  Unsuccessful outbound call made today to assist with:  Transportation Needs   Outreach Attempt:  1st Attempt Call cannot be completed   Yehuda Mao Greenauer -[facility] [place], Population Health [phone] [address] [place] , [place] [address] Email : [email or web address]

## 2022-03-20 ENCOUNTER — Telehealth: Payer: Self-pay | Admitting: *Deleted

## 2022-03-20 NOTE — Telephone Encounter (Signed)
   Telephone encounter was:  Successful.  03/20/2022 Name: Derrick Barajas MRN: [id number] DOB: [birth year]  Derrick Barajas is a 66 y.o. year old male who is a primary care patient of Tejan-Sie, Marcelino Freestone, MD . The community resource team was consulted for assistance with Transportation Needs   Care guide performed the following interventions: Patient provided with information about care guide support team and interviewed to confirm resource needs. patient requesting insurance transportation information he has united health care and it is a dual plan including transport  Follow Up Plan:  No further follow up planned at this time. The patient has been provided with needed resources. Yehuda Mao Greenauer -[facility] [place], Population Health [phone] [address] [place] , [place] [address] Email : [email or web address]

## 2022-03-22 DIAGNOSIS — E119 Type 2 diabetes mellitus without complications: Secondary | ICD-10-CM | POA: Diagnosis not present

## 2022-03-23 DIAGNOSIS — M109 Gout, unspecified: Secondary | ICD-10-CM | POA: Diagnosis not present

## 2022-03-23 DIAGNOSIS — E119 Type 2 diabetes mellitus without complications: Secondary | ICD-10-CM | POA: Diagnosis not present

## 2022-03-23 DIAGNOSIS — N189 Chronic kidney disease, unspecified: Secondary | ICD-10-CM | POA: Diagnosis not present

## 2022-03-23 DIAGNOSIS — I10 Essential (primary) hypertension: Secondary | ICD-10-CM | POA: Diagnosis not present

## 2022-04-07 DIAGNOSIS — E782 Mixed hyperlipidemia: Secondary | ICD-10-CM | POA: Diagnosis not present

## 2022-04-07 DIAGNOSIS — N189 Chronic kidney disease, unspecified: Secondary | ICD-10-CM | POA: Diagnosis not present

## 2022-04-07 DIAGNOSIS — I129 Hypertensive chronic kidney disease with stage 1 through stage 4 chronic kidney disease, or unspecified chronic kidney disease: Secondary | ICD-10-CM | POA: Diagnosis not present

## 2022-04-10 DIAGNOSIS — E113293 Type 2 diabetes mellitus with mild nonproliferative diabetic retinopathy without macular edema, bilateral: Secondary | ICD-10-CM | POA: Diagnosis not present

## 2022-04-20 DIAGNOSIS — E063 Autoimmune thyroiditis: Secondary | ICD-10-CM | POA: Diagnosis not present

## 2022-04-20 DIAGNOSIS — E1159 Type 2 diabetes mellitus with other circulatory complications: Secondary | ICD-10-CM | POA: Diagnosis not present

## 2022-04-24 ENCOUNTER — Ambulatory Visit: Payer: Self-pay

## 2022-04-24 NOTE — Patient Instructions (Signed)
Visit Information  Thank you for taking time to visit with me today. Please don't hesitate to contact me if I can be of assistance to you.   Following are the goals we discussed today:   Goals Addressed             This Visit's Progress    Patient Stated: " I want to get my diabetes under better control"       Care Coordination Interventions: Discussed basic DM disease process Reviewed medications with patient and discussed importance of medication adherence Discussed with patient management of  hypo and hyperglycemia and importance of correct treatment.  Advised patient to call his provider for frequent blood sugars <70 or >200. Reviewed scheduled/upcoming provider appointments  Referred patient to community resource guide for transportation  Mailed patient education article on diabetes diet.  Patient advised to call his [place] [employer] care plan customer service regarding transportation assistance for appointments.  Patient has [place] [employer] Medicaid.  Discussed diabetic diet options/ controlled carbohydrate diet. Discussed diabetic diet plate method. Advised that meals should consist of 1/2 of plate with nonstarchy vegetables, 1/4 with lean protein, and 1/4 with carb foods.             Our next appointment is by telephone on 05/25/22 at 11:00 am  Please call the care guide team at [phone] if you need to cancel or reschedule your appointment.   If you are experiencing a Mental Health or [place] or need someone to talk to, please call 1-800-273-TALK (toll free, 24 hour hotline)  The patient verbalized understanding of instructions, educational materials, and care plan provided today and agreed to receive a mailed copy of patient instructions, educational materials, and care plan.   Quinn Plowman RN,BSN,CCM [place] Coordination [phone] direct line   Diabetes Mellitus and Nutrition, Adult When you have diabetes, or diabetes  mellitus, it is very important to have healthy eating habits because your blood sugar (glucose) levels are greatly affected by what you eat and drink. Eating healthy foods in the right amounts, at about the same times every day, can help you: Manage your blood glucose. Lower your risk of heart disease. Improve your blood pressure. Reach or maintain a healthy weight. What can affect my meal plan? Every person with diabetes is different, and each person has different needs for a meal plan. Your health care provider may recommend that you work with a dietitian to make a meal plan that is best for you. Your meal plan may vary depending on factors such as: The calories you need. The medicines you take. Your weight. Your blood glucose, blood pressure, and cholesterol levels. Your activity level. Other health conditions you have, such as heart or kidney disease. How do carbohydrates affect me? Carbohydrates, also called carbs, affect your blood glucose level more than any other type of food. Eating carbs raises the amount of glucose in your blood. It is important to know how many carbs you can safely have in each meal. This is different for every person. Your dietitian can help you calculate how many carbs you should have at each meal and for each snack. How does alcohol affect me? Alcohol can cause a decrease in blood glucose (hypoglycemia), especially if you use insulin or take certain diabetes medicines by mouth. Hypoglycemia can be a life-threatening condition. Symptoms of hypoglycemia, such as sleepiness, dizziness, and confusion, are similar to symptoms of having too much alcohol. Do not drink alcohol if: Your health  care provider tells you not to drink. You are pregnant, may be pregnant, or are planning to become pregnant. If you drink alcohol: Limit how much you have to: 0-1 drink a day for women. 0-2 drinks a day for men. Know how much alcohol is in your drink. In the [place] one drink  equals one 12 oz bottle of beer (355 mL), one 5 oz glass of wine (148 mL), or one 1 oz glass of hard liquor (44 mL). Keep yourself hydrated with water, diet soda, or unsweetened iced tea. Keep in mind that regular soda, juice, and other mixers may contain a lot of sugar and must be counted as carbs. What are tips for following this plan?  Reading food labels Start by checking the serving size on the Nutrition Facts label of packaged foods and drinks. The number of calories and the amount of carbs, fats, and other nutrients listed on the label are based on one serving of the item. Many items contain more than one serving per package. Check the total grams (g) of carbs in one serving. Check the number of grams of saturated fats and trans fats in one serving. Choose foods that have a low amount or none of these fats. Check the number of milligrams (mg) of salt (sodium) in one serving. Most people should limit total sodium intake to less than 2,300 mg per day. Always check the nutrition information of foods labeled as "low-fat" or "nonfat." These foods may be higher in added sugar or refined carbs and should be avoided. Talk to your dietitian to identify your daily goals for nutrients listed on the label. Shopping Avoid buying canned, pre-made, or processed foods. These foods tend to be high in fat, sodium, and added sugar. Shop around the outside edge of the grocery store. This is where you will most often find fresh fruits and vegetables, bulk grains, fresh meats, and fresh dairy products. Cooking Use low-heat cooking methods, such as baking, instead of high-heat cooking methods, such as deep frying. Cook using healthy oils, such as olive, canola, or sunflower oil. Avoid cooking with butter, cream, or high-fat meats. Meal planning Eat meals and snacks regularly, preferably at the same times every day. Avoid going long periods of time without eating. Eat foods that are high in fiber, such as fresh  fruits, vegetables, beans, and whole grains. Eat 4-6 oz (112-168 g) of lean protein each day, such as lean meat, chicken, fish, eggs, or tofu. One ounce (oz) (28 g) of lean protein is equal to: 1 oz (28 g) of meat, chicken, or fish. 1 egg.  cup (62 g) of tofu. Eat some foods each day that contain healthy fats, such as avocado, nuts, seeds, and fish. What foods should I eat? Fruits Berries. Apples. Oranges. Peaches. Apricots. Plums. Grapes. Mangoes. Papayas. Pomegranates. Kiwi. Cherries. Vegetables Leafy greens, including lettuce, spinach, kale, chard, collard greens, mustard greens, and cabbage. Beets. Cauliflower. Broccoli. Carrots. Aariv [place] beans. Tomatoes. Peppers. Onions. Cucumbers. Brussels sprouts. Grains Whole grains, such as whole-wheat or whole-grain bread, crackers, tortillas, cereal, and pasta. Unsweetened oatmeal. Quinoa. Brown or wild rice. Meats and other proteins Seafood. Poultry without skin. Lean cuts of poultry and beef. Tofu. Nuts. Seeds. Dairy Low-fat or fat-free dairy products such as milk, yogurt, and cheese. The items listed above may not be a complete list of foods and beverages you can eat and drink. Contact a dietitian for more information. What foods should I avoid? Fruits Fruits canned with syrup. Vegetables Canned vegetables. Frozen  vegetables with butter or cream sauce. Grains Refined white flour and flour products such as bread, pasta, snack foods, and cereals. Avoid all processed foods. Meats and other proteins Fatty cuts of meat. Poultry with skin. Breaded or fried meats. Processed meat. Avoid saturated fats. Dairy Full-fat yogurt, cheese, or milk. Beverages Sweetened drinks, such as soda or iced tea. The items listed above may not be a complete list of foods and beverages you should avoid. Contact a dietitian for more information. Questions to ask a health care provider Do I need to meet with a certified diabetes care and education specialist? Do I  need to meet with a dietitian? What number can I call if I have questions? When are the best times to check my blood glucose? Where to find more information: American Diabetes Association: diabetes.org Academy of Nutrition and Dietetics: eatright.[employer] of Diabetes and Digestive and Kidney Diseases: StageSync.si Association of Diabetes Care & Education Specialists: diabeteseducator.org Summary It is important to have healthy eating habits because your blood sugar (glucose) levels are greatly affected by what you eat and drink. It is important to use alcohol carefully. A healthy meal plan will help you manage your blood glucose and lower your risk of heart disease. Your health care provider may recommend that you work with a dietitian to make a meal plan that is best for you. This information is not intended to replace advice given to you by your health care provider. Make sure you discuss any questions you have with your health care provider. Document Revised: 01/27/2020 Document Reviewed: 01/27/2020 Elsevier Patient Education  2023 [employer].

## 2022-04-24 NOTE — Patient Outreach (Signed)
  Care Coordination   Follow Up Visit Note   04/24/2022 Name: Derrick Barajas MRN: [id number] DOB: 03-12-1956  Derrick Barajas is a 66 y.o. year old male who sees Jodi Marble, MD for primary care. I spoke with  Derrick Barajas by phone today.  What matters to the patients health and wellness today?  Patient reports blood sugars range from 65-220.  He reports this mornings fasting blood sugar was 139.  Patient states he is taking his medications as prescribed.  He states he knows why his blood sugar increases at times.   He states at times he eats more sweets/ carbohydrates than he should.     Goals Addressed             This Visit's Progress    Patient Stated: " I want to get my diabetes under better control"       Care Coordination Interventions: Discussed basic DM disease process Reviewed medications with patient and discussed importance of medication adherence Discussed with patient management of  hypo and hyperglycemia and importance of correct treatment.  Advised patient to call his provider for frequent blood sugars <70 or >200. Reviewed scheduled/upcoming provider appointments  Referred patient to community resource guide for transportation  Mailed patient education article on diabetes diet.  Patient advised to call his [place] [employer] care plan customer service regarding transportation assistance for appointments.  Patient has [place] [employer] Medicaid.  Discussed diabetic diet options/ controlled carbohydrate diet. Discussed diabetic diet plate method. Advised that meals should consist of 1/2 of plate with nonstarchy vegetables, 1/4 with lean protein, and 1/4 with carb foods.             SDOH assessments and interventions completed:  No     Care Coordination Interventions Activated:  Yes  Care Coordination Interventions:  Yes, provided   Follow up plan: Follow up call scheduled for 05/25/22 at 11:00 am    Encounter Outcome:  Pt. Visit Completed    Quinn Plowman RN,BSN,CCM [place] [phone] direct line

## 2022-04-25 DIAGNOSIS — E1142 Type 2 diabetes mellitus with diabetic polyneuropathy: Secondary | ICD-10-CM | POA: Diagnosis not present

## 2022-04-25 DIAGNOSIS — E1159 Type 2 diabetes mellitus with other circulatory complications: Secondary | ICD-10-CM | POA: Diagnosis not present

## 2022-04-25 DIAGNOSIS — E1165 Type 2 diabetes mellitus with hyperglycemia: Secondary | ICD-10-CM | POA: Diagnosis not present

## 2022-04-25 DIAGNOSIS — N1831 Chronic kidney disease, stage 3a: Secondary | ICD-10-CM | POA: Diagnosis not present

## 2022-04-25 DIAGNOSIS — Z794 Long term (current) use of insulin: Secondary | ICD-10-CM | POA: Diagnosis not present

## 2022-04-25 DIAGNOSIS — E1122 Type 2 diabetes mellitus with diabetic chronic kidney disease: Secondary | ICD-10-CM | POA: Diagnosis not present

## 2022-04-25 DIAGNOSIS — E1169 Type 2 diabetes mellitus with other specified complication: Secondary | ICD-10-CM | POA: Diagnosis not present

## 2022-04-25 DIAGNOSIS — I10 Essential (primary) hypertension: Secondary | ICD-10-CM | POA: Diagnosis not present

## 2022-04-25 DIAGNOSIS — E119 Type 2 diabetes mellitus without complications: Secondary | ICD-10-CM | POA: Diagnosis not present

## 2022-04-25 DIAGNOSIS — E785 Hyperlipidemia, unspecified: Secondary | ICD-10-CM | POA: Diagnosis not present

## 2022-04-25 DIAGNOSIS — N189 Chronic kidney disease, unspecified: Secondary | ICD-10-CM | POA: Diagnosis not present

## 2022-04-25 DIAGNOSIS — M109 Gout, unspecified: Secondary | ICD-10-CM | POA: Diagnosis not present

## 2022-04-25 DIAGNOSIS — E063 Autoimmune thyroiditis: Secondary | ICD-10-CM | POA: Diagnosis not present

## 2022-05-25 ENCOUNTER — Ambulatory Visit: Payer: Self-pay

## 2022-05-25 NOTE — Patient Outreach (Signed)
  Care Coordination   Follow Up Visit Note   05/25/2022 Name: Derrick Barajas MRN: [id number] DOB: [birth year]  Derrick Barajas is a 66 y.o. year old male who sees Sherron Monday, MD for primary care. I spoke with  Derrick Barajas by phone today.  What matters to the patients health and wellness today?  Patient reports blood sugars ranging from low 60's to 256.  Patient states he experiences low blood sugars at least 2 x per week.  He reports today's blood sugar was 67 and states he ate a [place] sausage egg and cheese biscuit to treat.  Patient states he has transportation assistance and received education article on diabetes diet from [facility] in the mail.      Goals Addressed             This Visit's Progress    Patient Stated: " I want to get my diabetes under better control"       Care Coordination Interventions: Reviewed medications with patient and discussed importance of medication adherence Discussed with patient management of  hypo and hyperglycemia and importance of correct treatment.  Advised patient to call his provider and notify her of frequent blood sugar readings <70. Reviewed scheduled/upcoming provider appointments  Mailed patient education article on diabetes diet.  Discussed importance of eating meals during the day and healthy snack before bed            SDOH assessments and interventions completed:  No     Care Coordination Interventions Activated:  Yes  Care Coordination Interventions:  Yes, provided   Follow up plan: Follow up call scheduled for 06/28/22    Encounter Outcome:  Pt. Visit Completed   George Ina RN,BSN,CCM [facility] Care Coordination [phone] direct line

## 2022-06-04 DIAGNOSIS — I739 Peripheral vascular disease, unspecified: Secondary | ICD-10-CM | POA: Diagnosis not present

## 2022-06-04 DIAGNOSIS — L853 Xerosis cutis: Secondary | ICD-10-CM | POA: Diagnosis not present

## 2022-06-04 DIAGNOSIS — I872 Venous insufficiency (chronic) (peripheral): Secondary | ICD-10-CM | POA: Diagnosis not present

## 2022-06-04 DIAGNOSIS — B351 Tinea unguium: Secondary | ICD-10-CM | POA: Diagnosis not present

## 2022-06-04 DIAGNOSIS — E1142 Type 2 diabetes mellitus with diabetic polyneuropathy: Secondary | ICD-10-CM | POA: Diagnosis not present

## 2022-06-04 DIAGNOSIS — I890 Lymphedema, not elsewhere classified: Secondary | ICD-10-CM | POA: Diagnosis not present

## 2022-06-26 DIAGNOSIS — I10 Essential (primary) hypertension: Secondary | ICD-10-CM | POA: Diagnosis not present

## 2022-06-28 ENCOUNTER — Ambulatory Visit: Payer: Self-pay

## 2022-06-28 NOTE — Patient Outreach (Signed)
  Care Coordination   Follow Up Visit Note   06/28/2022 Name: Derrick Barajas MRN: [id number] DOB: [birth year]  Derrick Barajas is a 66 y.o. year old male who sees Sherron Monday, MD for primary care. I spoke with  Derrick Barajas by phone today.  What matters to the patients health and wellness today?  Management of diabetes  Return call received from [facility] with oncology office.  Discussed patients reported frequent blood sugars.  Boyd Kerbs states she will discuss with endocrinologist Dr. Alesia Morin and call/ follow up with patient to advise.  Informed Boyd Kerbs patient is having some issues with his phone. Advised her if she is unable to reach patient then call and notify RNCM to assist with attempting to contact.    Goals Addressed             This Visit's Progress    Patient Stated: " I want to get my diabetes under better control"       Care Coordination Interventions: Called endocrinology office called to report patients frequent low blood sugars as reported by patient. Message left with Clydie Braun requesting call back from providers nurse.  Patient reports having a blood sugar of 44 fasting on yesterday 06/27/22.  He reports feeling nervous, sweaty, vision loss and disorientation.  Patient reports having several fasting blood sugars in 70's and below within the past couple of weeks. He reports eating sugar free cookies and diet soda or water at night before bed. Reviewed medications with patient and discussed importance of medication adherence: Patient states, " I may be taking to much insulin medication.  My primary provider told me to take 130 u of tresiba 2 x per day."  Discussed with patient management of  hypo and hyperglycemia and importance of correct treatment.  Advised patient to call his provider and notify her of frequent blood sugar readings <70. Reviewed scheduled/upcoming provider appointments:   Discussed ongoing follow up with RNCM for care coordination:  Patient verbally agreed to next  telephone outreach with [facility] for            SDOH assessments and interventions completed:  No     Care Coordination Interventions:  Yes, provided   Follow up plan: Follow up call scheduled for 07/23/22    Encounter Outcome:  Pt. Visit Completed

## 2022-07-05 DIAGNOSIS — I10 Essential (primary) hypertension: Secondary | ICD-10-CM | POA: Diagnosis not present

## 2022-07-05 DIAGNOSIS — E782 Mixed hyperlipidemia: Secondary | ICD-10-CM | POA: Diagnosis not present

## 2022-07-05 DIAGNOSIS — R0602 Shortness of breath: Secondary | ICD-10-CM | POA: Diagnosis not present

## 2022-07-05 DIAGNOSIS — I340 Nonrheumatic mitral (valve) insufficiency: Secondary | ICD-10-CM | POA: Diagnosis not present

## 2022-07-05 DIAGNOSIS — I2510 Atherosclerotic heart disease of native coronary artery without angina pectoris: Secondary | ICD-10-CM | POA: Diagnosis not present

## 2022-07-05 DIAGNOSIS — I219 Acute myocardial infarction, unspecified: Secondary | ICD-10-CM | POA: Diagnosis not present

## 2022-07-05 DIAGNOSIS — I351 Nonrheumatic aortic (valve) insufficiency: Secondary | ICD-10-CM | POA: Diagnosis not present

## 2022-07-07 DIAGNOSIS — I129 Hypertensive chronic kidney disease with stage 1 through stage 4 chronic kidney disease, or unspecified chronic kidney disease: Secondary | ICD-10-CM | POA: Diagnosis not present

## 2022-07-07 DIAGNOSIS — N189 Chronic kidney disease, unspecified: Secondary | ICD-10-CM | POA: Diagnosis not present

## 2022-07-07 DIAGNOSIS — E782 Mixed hyperlipidemia: Secondary | ICD-10-CM | POA: Diagnosis not present

## 2022-07-17 ENCOUNTER — Encounter: Payer: Self-pay | Admitting: Family Medicine

## 2022-07-17 ENCOUNTER — Ambulatory Visit: Payer: Medicare Other | Admitting: Family Medicine

## 2022-07-17 DIAGNOSIS — Z113 Encounter for screening for infections with a predominantly sexual mode of transmission: Secondary | ICD-10-CM

## 2022-07-17 DIAGNOSIS — Z8619 Personal history of other infectious and parasitic diseases: Secondary | ICD-10-CM

## 2022-07-17 LAB — HM HIV SCREENING LAB: HM HIV Screening: NEGATIVE

## 2022-07-17 NOTE — Progress Notes (Signed)
Pt here for STI screening.  No in house labs performed today.  Counseled regarding time frame for obtaining test results and notification by clinic if any positives. Condoms Isaiah Serge, RN

## 2022-07-17 NOTE — Progress Notes (Signed)
[facility] Department STI clinic/screening visit  Subjective:  Derrick Barajas is a 67 y.o. male being seen today for an STI screening visit. The patient reports they do not have symptoms.    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  Last HIV test per patient/review of record was  Lab Results  Component Value Date   HMHIVSCREEN Negative - Validated 03/07/2022   No results found for: "HIV"  Does the patient or their partner desires a pregnancy in the next year? No  Screening for MPX risk: Does the patient have an unexplained rash? No Is the patient MSM? No Does the patient endorse multiple sex partners or anonymous sex partners? No Did the patient have close or sexual contact with a person diagnosed with MPX? No Has the patient traveled outside the [place] where MPX is endemic? No Is there a high clinical suspicion for MPX-- evidenced by one of the following No  -Unlikely to be chickenpox  -Lymphadenopathy  -Rash that present in same phase of evolution on any given body part   See flowsheet for further details and programmatic requirements.   Immunization History  Administered Date(s) Administered   Tdap 07/10/2013, 12/01/2021     The following  portions of the patient's history were reviewed and updated as appropriate: allergies, current medications, past medical history, past social history, past surgical history and problem list.  Objective:  There were no vitals filed for this visit.  Physical Exam Constitutional:      Appearance: Normal appearance.  HENT:     Head: Normocephalic and atraumatic.     Comments: No nits or hair loss    Mouth/Throat:     Mouth: Mucous membranes are moist. No oral lesions.     Pharynx: Oropharynx is clear. No oropharyngeal exudate or posterior oropharyngeal erythema.  Eyes:     General:        Right eye: No discharge.        Left eye: No discharge.     Conjunctiva/sclera:     Right eye: Right conjunctiva is not injected. No exudate.    Left eye: Left conjunctiva is not injected. No exudate. Pulmonary:     Effort: Pulmonary effort is normal.  Abdominal:     General: Abdomen is flat.     Palpations: Abdomen is soft. There is no hepatomegaly or mass.     Tenderness: There is no abdominal tenderness. There is no rebound.     Hernia: There is no hernia in the left inguinal area or right inguinal area.  Genitourinary:    Pubic Area: No rash or pubic lice (no nits).      Penis: Normal. No tenderness, discharge, swelling or lesions.      Testes: Normal.  Epididymis:     Right: Normal. No mass or tenderness.     Left: Normal. No mass or tenderness.     Rectum: Normal. No tenderness (no lesions or discharge).     Comments: Penile Discharge Amount: none Color:  none Lymphadenopathy:     Head:     Right side of head: No preauricular or posterior auricular adenopathy.     Left side of head: Posterior auricular adenopathy present. No preauricular adenopathy.     Cervical: No cervical adenopathy.     Upper Body:     Right upper body: No supraclavicular, axillary or epitrochlear adenopathy.     Left upper body: No supraclavicular, axillary or epitrochlear adenopathy.     Lower Body: No right  inguinal adenopathy. No left inguinal adenopathy.     Comments: Possible swollen left posterior auricular lymph node.   Skin:    General: Skin is warm and dry.     Findings: No lesion or rash.  Neurological:     Mental Status: He is alert and oriented to person, place, and time.       Assessment and Plan:  Derrick Barajas is a 67 y.o. male presenting to the [facility] Department for STI screening  1. Screening for venereal disease Patient has a possible enlarged lymph node on the left posterior auricular. Unclear how long this has been there. Patient states he recently had a housecalls visit and the NP told him she felt this. Counseled to discuss with PCP. No other symptoms.   - HIV [place] [facility] - Syphilis Serology, [place] [facility] - Chlamydia/GC NAA, Confirmation - Gonococcus culture  2. History of syphilis Patient had 1:2 RPR in August in ED- was treated with 1 dose of Bicillin. Told to f/u at health department in 6 months. I explained to him that we draw RPR every 6 months to evaluate the levels and see if additional treatment is needed. Offered to patient that he could return in end of Feb, or get testing today. Patient wanted testing today.   Patient does not have STI symptoms Patient accepted all screenings including   Patient meets criteria for HepB screening? No. Ordered? not applicable Patient meets criteria for HepC screening? No. Ordered? not applicable Recommended condom use with all sex Discussed importance of condom use for STI prevent  Treat per standing order Discussed time line for [facility] results and that patient will be called with positive results and encouraged patient to call if he had not heard in 2 weeks Recommended returning for continued or worsening symptoms.   Return if symptoms worsen or fail to improve.  Future Appointments  Date Time Provider Department Center  07/23/2022 10:00 AM Otho Ket, RN [facility] None    Lenice Llamas, [place]

## 2022-07-22 LAB — GONOCOCCUS CULTURE

## 2022-07-22 LAB — CHLAMYDIA/GC NAA, CONFIRMATION
Chlamydia trachomatis, NAA: NEGATIVE
Neisseria gonorrhoeae, NAA: NEGATIVE

## 2022-07-23 ENCOUNTER — Ambulatory Visit: Payer: Self-pay

## 2022-07-23 DIAGNOSIS — E119 Type 2 diabetes mellitus without complications: Secondary | ICD-10-CM | POA: Diagnosis not present

## 2022-07-23 DIAGNOSIS — E039 Hypothyroidism, unspecified: Secondary | ICD-10-CM | POA: Diagnosis not present

## 2022-07-23 DIAGNOSIS — I10 Essential (primary) hypertension: Secondary | ICD-10-CM | POA: Diagnosis not present

## 2022-07-23 NOTE — Patient Outreach (Signed)
  Care Coordination   Follow Up Visit Note   07/23/2022 Name: Derrick Barajas MRN: [id number] DOB: 12/03/1955  Derrick Barajas is a 67 y.o. year old male who sees Jodi Marble, MD for primary care. I spoke with  Derrick Barajas by phone today.  What matters to the patients health and wellness today?  Ongoing diabetes management   Goals Addressed             This Visit's Progress    Patient Stated: " I want to get my diabetes under better control"       Care Coordination Interventions: Called endocrinology office called to report patients frequent low blood sugars as reported by patient. Message left with Santiago Glad requesting call back from providers nurse. Patient states his blood sugars are ranging from low 100's to <200.  Patient states he has occasional blood sugars that range in the 80-90's.  Patient states he tries not to let his blood sugar get very low without treated it.  Patient reports he is having lab work today for a follow up appointment with his provider on 07/27/22.   Reviewed medications with patient and discussed importance of medication adherence:  Discussed last Hgb A1c reading of 8.2 on 04/20/22.  Discussed goal and explained to patient RNCM will discussed new lab work results with patient at next telephone outreach visit.  Re-Discussed with patient management of  hypo and hyperglycemia and importance of correct treatment.  Advised patient to call his provider and notify her of frequent blood sugar readings <70. Reviewed scheduled/upcoming provider appointments  Discussed ongoing follow up with [facility] for care coordination:  Patient verbally agreed to next telephone outreach with [facility] for 08/23/22.            SDOH assessments and interventions completed:  No     Care Coordination Interventions:  Yes, provided   Follow up plan: Follow up call scheduled for 08/23/22    Encounter Outcome:  Pt. Visit Completed   Quinn Plowman RN,BSN,CCM [place] [phone] direct line

## 2022-07-27 DIAGNOSIS — M109 Gout, unspecified: Secondary | ICD-10-CM | POA: Diagnosis not present

## 2022-07-27 DIAGNOSIS — I10 Essential (primary) hypertension: Secondary | ICD-10-CM | POA: Diagnosis not present

## 2022-07-27 DIAGNOSIS — Z0001 Encounter for general adult medical examination with abnormal findings: Secondary | ICD-10-CM | POA: Diagnosis not present

## 2022-07-27 DIAGNOSIS — G459 Transient cerebral ischemic attack, unspecified: Secondary | ICD-10-CM | POA: Diagnosis not present

## 2022-07-27 DIAGNOSIS — E119 Type 2 diabetes mellitus without complications: Secondary | ICD-10-CM | POA: Diagnosis not present

## 2022-07-27 DIAGNOSIS — I509 Heart failure, unspecified: Secondary | ICD-10-CM | POA: Diagnosis not present

## 2022-07-27 DIAGNOSIS — N189 Chronic kidney disease, unspecified: Secondary | ICD-10-CM | POA: Diagnosis not present

## 2022-08-06 DIAGNOSIS — E1142 Type 2 diabetes mellitus with diabetic polyneuropathy: Secondary | ICD-10-CM | POA: Diagnosis not present

## 2022-08-06 DIAGNOSIS — Z794 Long term (current) use of insulin: Secondary | ICD-10-CM | POA: Diagnosis not present

## 2022-08-08 NOTE — Addendum Note (Signed)
Addended by: Cletis Media on: 08/08/2022 03:41 PM   Modules accepted: Orders

## 2022-08-09 DIAGNOSIS — H6691 Otitis media, unspecified, right ear: Secondary | ICD-10-CM | POA: Diagnosis not present

## 2022-08-13 DIAGNOSIS — E1169 Type 2 diabetes mellitus with other specified complication: Secondary | ICD-10-CM | POA: Diagnosis not present

## 2022-08-13 DIAGNOSIS — E1159 Type 2 diabetes mellitus with other circulatory complications: Secondary | ICD-10-CM | POA: Diagnosis not present

## 2022-08-13 DIAGNOSIS — E063 Autoimmune thyroiditis: Secondary | ICD-10-CM | POA: Diagnosis not present

## 2022-08-13 DIAGNOSIS — E1122 Type 2 diabetes mellitus with diabetic chronic kidney disease: Secondary | ICD-10-CM | POA: Diagnosis not present

## 2022-08-13 DIAGNOSIS — Z794 Long term (current) use of insulin: Secondary | ICD-10-CM | POA: Diagnosis not present

## 2022-08-13 DIAGNOSIS — N1831 Chronic kidney disease, stage 3a: Secondary | ICD-10-CM | POA: Diagnosis not present

## 2022-08-13 DIAGNOSIS — E1142 Type 2 diabetes mellitus with diabetic polyneuropathy: Secondary | ICD-10-CM | POA: Diagnosis not present

## 2022-08-13 DIAGNOSIS — E785 Hyperlipidemia, unspecified: Secondary | ICD-10-CM | POA: Diagnosis not present

## 2022-08-22 ENCOUNTER — Telehealth: Payer: Self-pay | Admitting: Family Medicine

## 2022-08-22 NOTE — Telephone Encounter (Signed)
Patient states that he wants to speak with someone about his STI test results. Please call.

## 2022-08-22 NOTE — Telephone Encounter (Signed)
Pt notified of negative STD results from 07/17/2022.

## 2022-08-23 ENCOUNTER — Ambulatory Visit: Payer: Self-pay

## 2022-08-23 NOTE — Patient Outreach (Signed)
  Care Coordination   Follow Up Visit Note   08/23/2022 Name: Derrick Barajas MRN: [id number] DOB: [birth year]  Derrick Barajas is a 67 y.o. year old male who sees Jodi Marble, MD for primary care. I spoke with  Derrick Barajas by phone today.  What matters to the patients health and wellness today?  Patient states he is doing well.  Reports blood sugar ranges from 50's to 160's.  Patient states he is schedule to visit primary care office for application / instruction of [place].  Patient reports approximately 2 hypoglycemic events per week. He states his endocrinologist is aware and adjusted his medication.     Goals Addressed             This Visit's Progress    Patient Stated: " I want to get my diabetes under better control"       Interventions Today    Flowsheet Row Most Recent Value  Chronic Disease   Chronic disease during today's visit Diabetes  General Interventions   General Interventions Discussed/Reviewed General Interventions Reviewed, Doctor Visits  [discussed next telephone follow up with RNCM for 09/27/22]  Labs Hgb A1c every 6 months  [Discussed current Hgb A1c and A1c goal]  Doctor Visits Discussed/Reviewed Doctor Visits Reviewed  Education Interventions   Education Provided Provided Education  Provided Verbal Education On Blood Sugar Monitoring, Other  [Hypoglycemic treatment. Discussed upcoming appointment for [facility] application/ training.  Reminded patient next follow up with endocrinologist is 3 months.]  Nutrition Interventions   Nutrition Discussed/Reviewed Decreasing sugar intake  Pharmacy Interventions   Pharmacy Dicussed/Reviewed Pharmacy Topics Reviewed  [facility] endocrinologist medication adjustment]                  SDOH assessments and interventions completed:  No     Care Coordination Interventions:  Yes, provided   Follow up plan: Follow up call scheduled for 09/27/22    Encounter Outcome:  Pt. Visit Completed   Quinn Plowman  RN,BSN,CCM [place] [phone] direct line

## 2022-09-11 DIAGNOSIS — E668 Other obesity: Secondary | ICD-10-CM | POA: Insufficient documentation

## 2022-09-11 DIAGNOSIS — M0580 Other rheumatoid arthritis with rheumatoid factor of unspecified site: Secondary | ICD-10-CM | POA: Insufficient documentation

## 2022-09-11 DIAGNOSIS — I2510 Atherosclerotic heart disease of native coronary artery without angina pectoris: Secondary | ICD-10-CM | POA: Insufficient documentation

## 2022-09-11 DIAGNOSIS — I509 Heart failure, unspecified: Secondary | ICD-10-CM | POA: Insufficient documentation

## 2022-09-11 DIAGNOSIS — G4733 Obstructive sleep apnea (adult) (pediatric): Secondary | ICD-10-CM | POA: Insufficient documentation

## 2022-09-11 DIAGNOSIS — E038 Other specified hypothyroidism: Secondary | ICD-10-CM | POA: Insufficient documentation

## 2022-09-12 ENCOUNTER — Other Ambulatory Visit: Payer: Self-pay | Admitting: Internal Medicine

## 2022-09-13 ENCOUNTER — Other Ambulatory Visit: Payer: Self-pay | Admitting: Nephrology

## 2022-09-13 DIAGNOSIS — R8290 Unspecified abnormal findings in urine: Secondary | ICD-10-CM

## 2022-09-13 DIAGNOSIS — N1831 Chronic kidney disease, stage 3a: Secondary | ICD-10-CM

## 2022-09-13 DIAGNOSIS — E1122 Type 2 diabetes mellitus with diabetic chronic kidney disease: Secondary | ICD-10-CM

## 2022-09-14 ENCOUNTER — Other Ambulatory Visit: Payer: Self-pay

## 2022-09-14 MED ORDER — LEVOTHYROXINE SODIUM 150 MCG PO TABS: 150.0000 ug | ORAL_TABLET | Freq: Every day | ORAL | 0 refills | Status: DC

## 2022-09-17 ENCOUNTER — Ambulatory Visit: Payer: 59

## 2022-09-17 ENCOUNTER — Other Ambulatory Visit: Payer: Self-pay

## 2022-09-17 ENCOUNTER — Other Ambulatory Visit: Payer: Self-pay | Admitting: Cardiovascular Disease

## 2022-09-17 DIAGNOSIS — I200 Unstable angina: Secondary | ICD-10-CM

## 2022-09-17 DIAGNOSIS — R609 Edema, unspecified: Secondary | ICD-10-CM

## 2022-09-17 MED ORDER — RANOLAZINE ER 500 MG PO TB12: 500.0000 mg | ORAL_TABLET | Freq: Two times a day (BID) | ORAL | 1 refills | Status: DC

## 2022-09-17 MED ORDER — LISINOPRIL 40 MG PO TABS: 40.0000 mg | ORAL_TABLET | Freq: Every day | ORAL | 1 refills | Status: DC

## 2022-09-17 MED ORDER — ROSUVASTATIN CALCIUM 20 MG PO TABS: ORAL_TABLET | ORAL | 1 refills | Status: DC

## 2022-09-17 MED ORDER — FUROSEMIDE 20 MG PO TABS: 20.0000 mg | ORAL_TABLET | Freq: Two times a day (BID) | ORAL | 1 refills | Status: DC

## 2022-09-19 ENCOUNTER — Other Ambulatory Visit: Payer: Self-pay

## 2022-09-19 MED ORDER — LISINOPRIL 40 MG PO TABS: 40.0000 mg | ORAL_TABLET | Freq: Every day | ORAL | 1 refills | Status: DC

## 2022-09-19 MED ORDER — ROSUVASTATIN CALCIUM 20 MG PO TABS: ORAL_TABLET | ORAL | 1 refills | Status: DC

## 2022-09-19 NOTE — Progress Notes (Signed)
Follow Up Pharmacist Visit (CCM)  Derrick Barajas,Derrick Barajas  57 years, Male  DOB: 1955-12-05  M: [phone]  __________________________________________________ Clinical Summary Next Pharmacist Follow Up: 12/24/22 Next AWV: 10/26/22 Summary for PCP:  - Patient was not interested in getting a CGM to manage DM. - Recommended scheduling eye exam and increasing exercise to 53mns - [facility] . Patient's Chronic Conditions: Hypertension (HTN), Diabetes (DM), Hypothyroidism, Hyperlipidemia/Dyslipidemia (HLD) . Disease Assessments Visit Date Visit Completed on: 09/17/2022 Subjective Information Subjective: He was a little worried about his kidney ultrasound coming up. Lifestyle habits: He drinks plenty of water, He drinks soda (Coke, pepsi, tea, lemonade, milk. Sometimes he cooks at home, most times he eats out. Home cooked meals - out of can, chicken pot pie. He eats cabbage, meat, corn bread.  Exercise: walks for 273ms a day. Does not smoke Alcohol: beer weekly. What is the patient's sleep pattern?: Other (with free form text) Details: no set bedtime, no wake time set - whenever he falls asleep and wakes up 5.30-6am . SDOH: Accountable Health Communities Health-Related Social Needs Screening Tool ([email or web address] questions were documented in [employer] within the past 12 months or since hospitalization?: No Are you completing SDOH today?: Yes What is your living situation today? (ref #1): I have a steady place to live Think about the place you live. Do you have problems with any of the following? (ref #2): None of the above Within the past 12 months, you worried that your food would run out before you got money to buy more (ref #3): Never true Within the past 12 months, the food you bought just didn't last and you didn't have money to get more (ref #4): Never true In the past 12 months, has lack of  reliable transportation kept you from medical appointments, meetings, work or from getting things needed for daily living? (ref #5): No In the past 12 months, has the electric, gas, oil, or water company threatened to shut off services in your home? (ref #6): No How often does anyone, including family and friends, physically hurt you? (ref #7): Never (1) How often does anyone, including family and friends, insult or talk down to you? (ref #8): Never (1) How often does anyone, including friends and family, threaten you with harm? (ref #9): Never (1) How often does anyone, including family and friends, scream or curse at you? (ref #10): Never (1) . Medication Adherence [facility]?: Yes Medication adherence rates for the STAR metric medications: Ezetimibe 10 mg - 02/12/22 100 DS - 04/28/22 18 DS - 05/12/22 30 DS Jardiance 25 mg - 03/06/22 90 DS - 05/08/22 100 DS Lisinopril 40 mg - 07/27/22 100 DS Ramipril 10 mg - Unknown Rosuvastatin 20 mg - 07/27/21 100 DS Medication adherence rates for non-STAR metric medications: NA Factors that may affect medication adherence?: Pill burden, Low motivation Is Patient using [facility] pharmacy?: No Name and location of Current pharmacy: [facility]  Current Rx insurance plan: UHC Are meds synced by current pharmacy?: No Are meds delivered by current pharmacy?: No - delivery not available Assessment:: Potentially Non-adherent . Hypertension (HTN) Most Recent BP: 113/67 Most Recent HR: 62 taken on: 09/11/2022 Care Gap: Need BP documented or last BP 140/90 or higher: Addressed Assessed today?: Yes BP today is: unable to obtain Goal: <130/80 mmHG Is Patient checking BP at home?: Yes Has patient experienced hypotension, dizziness, falls or bradycardia?: No Assessment:: Controlled Drug:  spironolactone 25 mg  Pharmacist Assessment: Appropriate, Effective, Safe, Accessible Drug: ramipril 10 mg Pharmacist Assessment:  Appropriate, Effective, Safe, Accessible Drug: metoprolol 100 mg  Pharmacist Assessment: Appropriate, Effective, Safe, Accessible Drug: lisinopril 40 mg Drug: amlodipine 10 mg Pharmacist Assessment: Appropriate, Effective, Safe, Accessible . Hyperlipidemia/Dyslipidemia (HLD) Last Lipid panel on: 08/06/2022 TC (Goal<200): 120 LDL: 42 HDL (Goal>40): 41.3 TG (Goal<150): 186 ASCVD 10-year risk?is:: High (>20%) ASCVD Risk Score: 22 % Assessed today?: No Drug: zetia 10 mg  Drug: rosuvastatin 20 mg  . Diabetes (DM) Most recent A1C: 7.7 taken on: 08/06/2022 Most Recent GFR: <55 taken on: 01/29/224 Type: 2 Most recent microalbumin ratio: <5.4 tested on: 08/06/2022 Care Gap: Statin therapy needed: Needs to be addressed Care Gap: Need A1c documented or last A1c > 9 %: Needs to be addressed Care Gap: Need eye exam documented in EMR or by claim: Needs to be addressed Care Gap: Need eGFR and uACR for kidney health evaluation: Needs to be addressed Assessed today?: Yes Goal A1C: < 7.0 % Type: 2 Is Patient taking statin medication: Yes Blood glucose monitoring: Glucometer How often does patient check Blood Sugars?: twice a day  Recent fasting Blood sugar reading: <100 Recent bedtime Blood sugar reading: >140 Has patient experienced any hypoglycemic episodes?: saturday - was 75. When its too low, he feels nervous, sweaty. We discussed: How to recognize and treat signs of hypoglycemia., Proper diabetic foot care including daily foot exams and wearing closed toe shoes., Fasting and post-prandial blood glucose goals, Increasing exercise (walking, biking, swimming) to a goal of 30 minutes per day, as able based on current activity level and health Assessment:: Uncontrolled Drug: mounjaro 7.5 MG/0.5ML Pen Pharmacist Assessment: Appropriate, Query Effectiveness Drug: jardiance 25 mg  Pharmacist Assessment: Appropriate, Query Effectiveness Plan/Follow up: Patient was not interested in setting  up CGM Recommended increase exercise to 2msn Scheduled eye exam in September Hypothyroidism Most recent TSH: 0.770 taken on: 07/23/2022 Assessed today?: No Drug: levothyroxine 125 mcg  . Preventative Health Care Gap: Colorectal cancer screening: Needs to be addressed Care Gap: Breast cancer screening: Patient excluded from population (Age > 781 hx of bilateral mastectomy, frailty, hospice services) Care Gap: Annual Wellness Visit (AWV): Needs to be addressed Immunizations needed: Zoster, Pneumococcal Plan/Follow up: colonoscopy - check day  Engagement Notes MCharlann Langeon 09/13/2022 03:06 PM [facility] Review: 15 min 09/13/22  HC Assessment call time spent: 15 min 09/13/22   Pharmacist Interventions Intervention Details Pharmacist Interventions discussed: Yes Education: Lifestyle modifications . SJerral Ralph PharmD  Televisit 552ms Documentation 1238m

## 2022-09-20 ENCOUNTER — Ambulatory Visit
Admission: RE | Admit: 2022-09-20 | Discharge: 2022-09-20 | Disposition: A | Discharge status: Home / Self Care | Payer: 59 | Source: Ambulatory Visit | Attending: Nephrology | Admitting: Nephrology

## 2022-09-20 DIAGNOSIS — R8290 Unspecified abnormal findings in urine: Secondary | ICD-10-CM | POA: Diagnosis present

## 2022-09-20 DIAGNOSIS — E1122 Type 2 diabetes mellitus with diabetic chronic kidney disease: Secondary | ICD-10-CM | POA: Diagnosis present

## 2022-09-20 DIAGNOSIS — N1831 Chronic kidney disease, stage 3a: Secondary | ICD-10-CM | POA: Diagnosis present

## 2022-09-26 ENCOUNTER — Other Ambulatory Visit: Payer: Self-pay

## 2022-09-26 MED ORDER — LEVOTHYROXINE SODIUM 150 MCG PO TABS: 150.0000 ug | ORAL_TABLET | Freq: Every day | ORAL | 1 refills | Status: DC

## 2022-09-27 ENCOUNTER — Ambulatory Visit: Payer: Self-pay

## 2022-09-28 NOTE — Patient Outreach (Signed)
  Care Coordination   09/28/2022 Late entry for 09/27/22 Name: Derrick Barajas MRN: [id number] DOB: 1956-03-25   Care Coordination Outreach Attempts:  Successful contact was made with patient.  Patient having trouble with phone line.   Unable to hear patient clearly on phone.  Patient states he will return call to [facility].   Follow Up Plan:  Additional outreach attempts will be made to offer the patient care coordination information and services.   Encounter Outcome:  Patient to call RNCM back  Care Coordination Interventions:  No, not indicated    Quinn Plowman [facility] [place] [phone] direct line

## 2022-10-02 ENCOUNTER — Telehealth: Payer: Self-pay

## 2022-10-03 ENCOUNTER — Other Ambulatory Visit: Payer: Self-pay

## 2022-10-03 ENCOUNTER — Encounter: Payer: Self-pay | Admitting: Cardiology

## 2022-10-03 ENCOUNTER — Ambulatory Visit (INDEPENDENT_AMBULATORY_CARE_PROVIDER_SITE_OTHER): Payer: 59 | Admitting: Cardiology

## 2022-10-03 ENCOUNTER — Telehealth: Payer: Self-pay

## 2022-10-03 VITALS — BP 110/70 | HR 82 | Ht 67.0 in | Wt 278.2 lb

## 2022-10-03 DIAGNOSIS — I509 Heart failure, unspecified: Secondary | ICD-10-CM

## 2022-10-03 DIAGNOSIS — I10 Essential (primary) hypertension: Secondary | ICD-10-CM

## 2022-10-03 DIAGNOSIS — E668 Other obesity: Secondary | ICD-10-CM

## 2022-10-03 DIAGNOSIS — I2510 Atherosclerotic heart disease of native coronary artery without angina pectoris: Secondary | ICD-10-CM

## 2022-10-03 DIAGNOSIS — G4733 Obstructive sleep apnea (adult) (pediatric): Secondary | ICD-10-CM

## 2022-10-03 DIAGNOSIS — E782 Mixed hyperlipidemia: Secondary | ICD-10-CM

## 2022-10-03 DIAGNOSIS — E038 Other specified hypothyroidism: Secondary | ICD-10-CM

## 2022-10-03 MED ORDER — JARDIANCE 25 MG PO TABS: 25.0000 mg | ORAL_TABLET | Freq: Every morning | ORAL | 1 refills | Status: DC

## 2022-10-03 MED ORDER — LEVOTHYROXINE SODIUM 150 MCG PO TABS: 150.0000 ug | ORAL_TABLET | Freq: Every day | ORAL | 0 refills | Status: DC

## 2022-10-03 NOTE — Progress Notes (Signed)
Cardiology Office Note   Date:  10/03/2022   ID:  Derrick Barajas, DOB [birth year], MRN [id number]  PCP:  Derrick Marble, MD  Cardiologist:  Derrick Soho, NP      History of Present Illness: Derrick Barajas is a 67 y.o. male who presents for  Chief Complaint  Patient presents with   Follow-up    3 month follow up    Patient in office for routine cardiac exam. Denies chest pain, shortness of breath, dizziness, lower extremity edema.      Past Medical History:  Diagnosis Date   CAD (coronary artery disease)    CHF (congestive heart failure) (HCC)    Diabetes mellitus without complication (HCC)    Gout    Hyperlipidemia    Hypertension    MI (myocardial infarction) ([place])    RA (rheumatoid arthritis) ([place])    Sleep apnea    Stroke ([place])    left facal droop   Thyroid disease      Past Surgical History:  Procedure Laterality Date   CIRCUMCISION     CORONARY ANGIOPLASTY WITH STENT PLACEMENT     CORONARY STENT INTERVENTION N/A 11/17/2018   Procedure: CORONARY STENT INTERVENTION;  Surgeon: Derrick Kida, MD;  Location: [place] [facility];  Service: Cardiovascular;  Laterality: N/A;   LEFT HEART CATH AND CORONARY ANGIOGRAPHY Left 11/17/2018   Procedure: LEFT HEART CATH AND CORONARY ANGIOGRAPHY;  Surgeon: Derrick David, MD;  Location: [place] [facility];  Service: Cardiovascular;  Laterality: Left;   TONSILLECTOMY       Current Outpatient Medications  Medication Sig Dispense Refill   ACCU-CHEK AVIVA PLUS test strip   11   amLODipine (NORVASC) 10 MG tablet TAKE 1 TABLET BY MOUTH DAILY 100 tablet 1   aspirin EC 81 MG tablet Take 81 mg by mouth daily.     B-D ULTRAFINE III SHORT PEN 31G X 8 MM MISC   3   Colchicine 0.6 MG CAPS Take 1 capsule by mouth daily.     docusate sodium (COLACE) 100 MG capsule Take 100 mg at bedtime by mouth.      ezetimibe (ZETIA) 10 MG tablet Take 10 mg by mouth daily.      fluticasone (FLONASE) 50 MCG/ACT nasal spray Place 1  spray into both nostrils daily.  1   furosemide (LASIX) 20 MG tablet Take 1 tablet (20 mg total) by mouth 2 (two) times daily. 90 tablet 1   insulin degludec (TRESIBA) 200 UNIT/ML FlexTouch Pen Inject 120 Units into the skin in the morning and at bedtime.     isosorbide mononitrate (IMDUR) 60 MG 24 hr tablet Take 60 mg daily by mouth.     JARDIANCE 25 MG TABS tablet Take 25 mg by mouth every morning.     lisinopril (ZESTRIL) 40 MG tablet Take 1 tablet (40 mg total) by mouth daily. 90 tablet 1   metoprolol succinate (TOPROL-XL) 100 MG 24 hr tablet Take 100 mg daily by mouth. Take with or immediately following a meal.     MOUNJARO 7.5 MG/0.5ML Pen Inject into the skin.     nitroGLYCERIN (NITROSTAT) 0.4 MG SL tablet Place 0.4 mg under the tongue every 5 (five) minutes as needed for chest pain.     olopatadine (PATANOL) 0.1 % ophthalmic solution Place 1 drop into both eyes daily.     prasugrel (EFFIENT) 10 MG TABS Take 10 mg by mouth daily at 3 pm.  ranolazine (RANEXA) 500 MG 12 hr tablet Take 1 tablet (500 mg total) by mouth 2 (two) times daily. 180 tablet 1   rosuvastatin (CRESTOR) 20 MG tablet TK 1 T PO QD 90 tablet 1   spironolactone (ALDACTONE) 25 MG tablet Take 25 mg by mouth daily.     levothyroxine (SYNTHROID) 150 MCG tablet Take 1 tablet (150 mcg total) by mouth daily before breakfast. 30 tablet 0   No current facility-administered medications for this visit.    Allergies:   Shellfish-derived products, Sulfa antibiotics, Iodine, Atorvastatin, Iodinated contrast media, Nsaids, and Shrimp [shellfish allergy]    Social History:   reports that he has never smoked. He has never used smokeless tobacco. He reports current alcohol use of about 1.0 standard drink of alcohol per week. He reports that he does not use drugs.   Family History:  family history includes Diabetes in his brother and mother.    ROS:     Review of Systems  Constitutional: Negative.   HENT: Negative.     Respiratory: Negative.    Cardiovascular: Negative.   Gastrointestinal: Negative.   Skin: Negative.   Neurological:  Negative for dizziness.  All other systems reviewed and are negative.   All other systems are reviewed and negative.   PHYSICAL EXAM: VS:  BP 110/70   Pulse 82   Ht 5\' 7"  (1.702 m)   Wt 126.2 kg   SpO2 96%   BMI 43.57 kg/m  , BMI Body mass index is 43.57 kg/m. Last weight:  Wt Readings from Last 3 Encounters:  10/03/22 126.2 kg  03/06/22 127 kg  03/05/22 127 kg    Physical Exam Constitutional:      Appearance: Normal appearance. He is normal weight.  HENT:     Head: Normocephalic and atraumatic.  Eyes:     Extraocular Movements: Extraocular movements intact.     Conjunctiva/sclera: Conjunctivae normal.     Pupils: Pupils are equal, round, and reactive to light.  Cardiovascular:     Rate and Rhythm: Normal rate and regular rhythm.  Pulmonary:     Effort: Pulmonary effort is normal.     Breath sounds: Normal breath sounds.  Abdominal:     General: Abdomen is flat. Bowel sounds are normal.     Palpations: Abdomen is soft.  Musculoskeletal:        General: Normal range of motion.     Cervical back: Normal range of motion.  Neurological:     General: No focal deficit present.     Mental Status: He is alert and oriented to person, place, and time. Mental status is at baseline.  Psychiatric:        Mood and Affect: Mood normal.        Behavior: Behavior normal.        Thought Content: Thought content normal.        Judgment: Judgment normal.     EKG: none today  Recent Labs: No results found for requested labs within last 365 days.    Lipid Panel    Component Value Date/Time   CHOL 164 11/18/2018 0352   CHOL 139 08/29/2011 0351   TRIG 97 11/18/2018 0352   TRIG 185 08/29/2011 0351   HDL 63 11/18/2018 0352   HDL 38 (L) 08/29/2011 0351   CHOLHDL 2.6 11/18/2018 0352   VLDL 19 11/18/2018 0352   VLDL 37 08/29/2011 0351   LDLCALC 82  11/18/2018 0352   LDLCALC 64 08/29/2011 0351  Other studies Reviewed: Patient: 9110.0 - Derrick Barajas DOB:  15-Apr-1956  Date:  06/26/2022 10:00 Provider: Neoma Laming MD Encounter: ECHO   Page 2 REASON FOR VISIT  Visit for: Echocardiogram/I10  Sex:   Male         wt= 285   lbs.  BP=128/80  Height= 67   inches.   TESTS  Imaging: Echocardiogram:  An echocardiogram in (2-d) mode was performed and in Doppler mode with color flow velocity mapping was performed. The aortic valve cusps are abnormal 1.6   cm, flow velocity [id number]  m/s, and systolic calculated mean flow gradient 8  mmHg. Mitral valve diastolic peak flow velocity E 1.18   m/s and E/A ratio 0.8. Aortic root diameter 3.3  cm. The LVOT internal diameter 3.3  cm and flow velocity was abnormal 1.06   m/s. LV systolic dimension [id number]   cm, diastolic 123XX123  cm, posterior wall thickness 1.26    cm, fractional shortening 31.6  %, and EF 59.9  %. IVS thickness 1.29  cm. LA dimension 4.5 cm. Mitral Valve has Trace Regurgitation.    ASSESSMENT  Technically difficult study due to body habitus.  Normal left ventricular systolic function.  Mild left ventricular hypertrophy with GRADE 1 (relaxation abnormality) diastolic dysfunction.  Normal right ventricular systolic function.  Normal right ventricular diastolic function.  Normal left ventricular wall motion.  Normal right ventricular wall motion.  Normal pulmonary artery pressure.  Trace mitral regurgitation.  No pericardial effusion.  Mildly dilated Left atrium  Mild LVH.   THERAPY   Referring physician: Dionisio Barajas  Sonographer: Derrick Barajas.   Derrick Laming MD  Electronically signed by: Derrick Barajas     Date: 06/27/2022 11:58  Patient: 9110.0 - Corbett L. Raether DOB:  12-Jun-1956  Date:  03/31/2021 10:30 Provider: Neoma Laming MD Encounter: ALL ANGIOGRAMS (CTA BRAIN, CAROTIDS, RENAL ARTERIES, PE)   Page 2 REASON FOR VISIT  Referred by Dr.Shaukat Humphrey Rolls.    TESTS  Imaging: Computed Tomographic Angiography:  Cardiac multidetector CT was performed paying particular attention to the coronary arteries for the diagnosis of: CAD. Diagnostic Drugs:  Administered iohexol (Omnipaque) through an antecubital vein and images from the examination were analyzed for the presence and extent of coronary artery disease, using 3D image processing software. 100 mL of non-ionic contrast (Omnipaque) was used.   TEST CONCLUSIONS  Quality of study: Fair.  1-Calcium score: 1012.5  2-Right dominant system.  3-Stent in distal LAD patent. Stent in mid RCA patent. LCX has no significant disease.   Derrick Laming MD  Electronically signed by: Derrick Barajas     Date: 04/03/2021 08:59  Patient: 9110.0 - Tyler L. Devine DOB:  10/25/1955  Date:  08/12/2020 07:30 Provider: Neoma Laming MD Encounter: NUCLEAR STRESS TEST Amended 08/22/2020 10:43 by Leta Jungling   Page 2 [place] [address] [place], [place] [address] [phone] STUDY:  Gated Stress / Rest Myocardial Perfusion Imaging Tomographic (SPECT) Including attenuation correction Wall Motion, Left Ventricular Ejection Fraction By Gated Technique.Persantine Stress Test. SEX:  Male     WEIGHT: 290 lbs  HEIGHT: 67 in                ARMS UP: YES/NO  REFERRING PHYSICIAN: Dr.Shaukat Humphrey Rolls                                                                                                                                                                                                                       INDICATION FOR STUDY: SOB                                                                                                                                                                                                                    TECHNIQUE:  Approximately 20 minutes following the intravenous administration of 10.5 mCi of Tc-43m Sestamibi after stress testing in a reclined supine position with arms above their head if able to do so, gated SPECT imaging of the heart was performed. After about a 2hr break, the patient was injected intravenously with 31.1 mCi of Tc-20m Sestamibi.  Approximately 45 minutes later in the same position as stress imaging SPECT rest imaging of the heart was performed.  STRESS BY:  Derrick Laming, MD PROTOCOL:  Persantine   DOSE ADMIN: 10 cc      ROUTE OF ADMINISTRATION: IV  MAX PRED HR: 156                     85%: 133               75%: 117                                                                                                                   RESTING BP: 110/68  RESTING HR: 65  PEAK BP: 104/70  PEAK HR: 76                                                                    EXERCISE DURATION:    4 min injection                                            REASON FOR TEST TERMINATION:    Protocol end                                                                                                                              SYMPTOMS:   None/converted to persantine from TM. SOB and Fatigue on TM.  EKG RESULTS: NSR. 73/min. No significant ST changes with persantine.                                                             IMAGE QUALITY:  Good                                                                                                                                                                                                                                                                                                                                 PERFUSION/WALL MOTION FINDINGS: EF = 57%. Dilated LV, moderate size and intensity reversible mid inferior,mid inferolateral, mid anterolateral, and apical lateral wall defects, normal wall motion.                                                                           IMPRESSION: Possible ischemia in the LCX territory with normal LVEF, Dilated LV, advise CCTA.  Derrick Laming, MD Stress Interpreting Physician / Nuclear Interpreting Physician        Derrick Laming MD  Electronically signed by: Derrick Barajas     Date: 08/23/2020 08:51   ASSESSMENT AND PLAN:    ICD-10-CM   1. Hypertension, unspecified type  I10     2. Atherosclerosis of native coronary artery of native heart without angina pectoris  I25.10     3. Chronic congestive heart failure, unspecified heart failure type (HCC)  I50.9     4. Obstructive sleep apnea  G47.33     5. Other specified hypothyroidism  E03.8     6. Hyperlipidemia, mixed  E78.2     7. Other obesity  E66.8        Problem List Items Addressed This Visit       Cardiovascular and Mediastinum   Hypertension - Primary    Well controlled. Continue current therapy.       Coronary atherosclerosis    Patient denies chest pain, shortness of breath. CCTA 03/2021 Calcium score: 1012.5, stent in distal LAD patent. stent in mid RCA patent. LCX has no significant disease.       Congestive heart failure ([place])    Echo 06/2022 EF 59%, grade I DD. Denies shortness of breath. Wears compression hose for lower extremity edema. Continue current therapy.         Respiratory   Obstructive sleep apnea     Endocrine   Other specified hypothyroidism    Patient  out of his thyroid medication. Local Rx sent as a courtesy to prevent lapse in therapy.       Relevant Medications   levothyroxine (SYNTHROID) 150 MCG tablet     Other   Hyperlipidemia, mixed    08/06/22 LDL 42. Continue rosuvastatin 20 mg daily.       Other obesity     Disposition:   Return in about 4 months (around 02/02/2023).    Total time spent: 30 minutes  Signed,  [employer], NP  10/03/2022 9:50 AM    [place]

## 2022-10-03 NOTE — Patient Outreach (Signed)
  Care Coordination   10/03/2022 Late entry for 10/02/22 Name: Derrick Barajas MRN: [id number] DOB: May 08, 1956   Care Coordination Outreach Attempts:  An unsuccessful telephone outreach was attempted today to offer the patient information about available care coordination services as a benefit of their health plan.  Unable to reach patient or leave voice message due to telephone only ringing.   Follow Up Plan:  Additional outreach attempts will be made to offer the patient care coordination information and services.   Encounter Outcome:  No Answer   Care Coordination Interventions:  No, not indicated    Quinn Plowman [facility] [place] [phone] direct line

## 2022-10-03 NOTE — Assessment & Plan Note (Signed)
08/06/22 LDL 42. Continue rosuvastatin 20 mg daily.

## 2022-10-03 NOTE — Assessment & Plan Note (Signed)
Well controlled Continue current therapy 

## 2022-10-03 NOTE — Patient Outreach (Signed)
  Care Coordination   10/03/2022 Name: Derrick Barajas MRN: [id number] DOB: Apr 17, 1956   Care Coordination Outreach Attempts:  An unsuccessful telephone outreach was attempted today to offer the patient information about available care coordination services as a benefit of their health plan.  Unable to reach patient or leave voice message. Phone only rang.   Follow Up Plan:  Additional outreach attempts will be made to offer the patient care coordination information and services.   Encounter Outcome:  No Answer   Care Coordination Interventions:  No, not indicated    Quinn Plowman [facility] [phone] direct line

## 2022-10-03 NOTE — Assessment & Plan Note (Signed)
Patient denies chest pain, shortness of breath. CCTA 03/2021 Calcium score: 1012.5, stent in distal LAD patent. stent in mid RCA patent. LCX has no significant disease.

## 2022-10-03 NOTE — Assessment & Plan Note (Signed)
Patient out of his thyroid medication. Local Rx sent as a courtesy to prevent lapse in therapy.

## 2022-10-03 NOTE — Assessment & Plan Note (Signed)
Echo 06/2022 EF 59%, grade I DD. Denies shortness of breath. Wears compression hose for lower extremity edema. Continue current therapy.

## 2022-10-07 DIAGNOSIS — I129 Hypertensive chronic kidney disease with stage 1 through stage 4 chronic kidney disease, or unspecified chronic kidney disease: Secondary | ICD-10-CM

## 2022-10-07 DIAGNOSIS — N189 Chronic kidney disease, unspecified: Secondary | ICD-10-CM

## 2022-10-07 DIAGNOSIS — E782 Mixed hyperlipidemia: Secondary | ICD-10-CM | POA: Diagnosis not present

## 2022-10-08 ENCOUNTER — Ambulatory Visit: Payer: Self-pay

## 2022-10-08 ENCOUNTER — Other Ambulatory Visit: Payer: Self-pay

## 2022-10-08 MED ORDER — JARDIANCE 25 MG PO TABS: 25.0000 mg | ORAL_TABLET | Freq: Every morning | ORAL | 1 refills | Status: DC

## 2022-10-08 NOTE — Patient Outreach (Signed)
  Care Coordination   Follow Up Visit Note   10/08/2022 Name: Derrick Barajas MRN: [id number] DOB: 1956/01/02  Derrick Barajas is a 67 y.o. year old male who sees Jodi Marble, MD for primary care. I spoke with  Derrick Barajas by phone today.  What matters to the patients health and wellness today?  Patient states he is doing well.  He reports today's fasting blood sugar was 130. He reports blood sugar ranges 70's to 130.  He reports his next follow up visit with the endocrinologist is 11/22/22.  Patient reports physical activity/ bike riding every 3-5 days per week as weather permits.  Patient states he uses the splenda blend as his sweetener. Patient states he feels he is managing his diabetes and sees his providers regularly.  Patient denies any additional care coordination/ disease management or community resource needs.  Patient to contact RNCM or provider if care coordination needs in the future.    Goals Addressed             This Visit's Progress    COMPLETED: Patient Stated: " I want to get my diabetes under better control"       Interventions Today    Flowsheet Row Most Recent Value  Chronic Disease   Chronic disease during today's visit Diabetes  General Interventions   General Interventions Discussed/Reviewed General Interventions Reviewed, Doctor Visits  [discussed next telephone follow up with RNCM for 09/27/22]  Labs Hgb A1c every 6 months  [Discussed current Hgb A1c and A1c goal]  Doctor Visits Discussed/Reviewed Doctor Visits Reviewed  Education Interventions   Education Provided Provided Education  Provided Verbal Education On Blood Sugar Monitoring, Other  [Hypoglycemic treatment. Discussed upcoming appointment for [facility] application/ training.  Reminded patient next follow up with endocrinologist is 3 months.]  Nutrition Interventions   Nutrition Discussed/Reviewed Decreasing sugar intake  Pharmacy Interventions   Pharmacy Dicussed/Reviewed Pharmacy Topics Reviewed   [facility] endocrinologist medication adjustment]                  SDOH assessments and interventions completed:  No     Care Coordination Interventions:  Yes, provided   Follow up plan: No further intervention required  Encounter Outcome:  Pt. Visit Completed   Quinn Plowman RN,BSN,CCM [place] [phone] direct line

## 2022-10-23 ENCOUNTER — Other Ambulatory Visit: Payer: Self-pay | Admitting: Internal Medicine

## 2022-10-23 ENCOUNTER — Other Ambulatory Visit: Payer: 59

## 2022-10-24 LAB — LIPID PANEL W/O CHOL/HDL RATIO
Cholesterol, Total: 118 mg/dL (ref 100–199)
HDL: 43 mg/dL (ref >39)
LDL Chol Calc (NIH): 57 mg/dL (ref 0–99)
Triglycerides: 95 mg/dL (ref 0–149)
VLDL Cholesterol Cal: 18 mg/dL (ref 5–40)

## 2022-10-24 LAB — TSH: TSH: 1.14 u[IU]/mL (ref 0.450–4.500)

## 2022-10-24 LAB — HGB A1C W/O EAG: Hgb A1c MFr Bld: 7.8 % — ABNORMAL HIGH (ref 4.8–5.6)

## 2022-10-25 ENCOUNTER — Other Ambulatory Visit: Payer: Self-pay

## 2022-10-25 MED ORDER — SPIRONOLACTONE 25 MG PO TABS: 25.0000 mg | ORAL_TABLET | Freq: Every day | ORAL | 3 refills | Status: DC

## 2022-10-26 ENCOUNTER — Ambulatory Visit: Payer: 59 | Admitting: Internal Medicine

## 2022-10-28 ENCOUNTER — Other Ambulatory Visit: Payer: Self-pay

## 2022-10-28 ENCOUNTER — Emergency Department
Admission: EM | Admit: 2022-10-28 | Discharge: 2022-10-28 | Disposition: A | Discharge status: Home / Self Care | Payer: 59 | Attending: Emergency Medicine | Admitting: Emergency Medicine

## 2022-10-28 ENCOUNTER — Emergency Department: Payer: 59

## 2022-10-28 ENCOUNTER — Encounter: Payer: Self-pay | Admitting: Intensive Care

## 2022-10-28 DIAGNOSIS — S93601A Unspecified sprain of right foot, initial encounter: Secondary | ICD-10-CM | POA: Diagnosis not present

## 2022-10-28 DIAGNOSIS — W1830XA Fall on same level, unspecified, initial encounter: Secondary | ICD-10-CM | POA: Insufficient documentation

## 2022-10-28 DIAGNOSIS — X501XXA Overexertion from prolonged static or awkward postures, initial encounter: Secondary | ICD-10-CM | POA: Diagnosis not present

## 2022-10-28 DIAGNOSIS — Z8673 Personal history of transient ischemic attack (TIA), and cerebral infarction without residual deficits: Secondary | ICD-10-CM | POA: Diagnosis not present

## 2022-10-28 DIAGNOSIS — Y92009 Unspecified place in unspecified non-institutional (private) residence as the place of occurrence of the external cause: Secondary | ICD-10-CM | POA: Insufficient documentation

## 2022-10-28 DIAGNOSIS — S99921A Unspecified injury of right foot, initial encounter: Secondary | ICD-10-CM | POA: Diagnosis present

## 2022-10-28 MED ORDER — HYDROCODONE-ACETAMINOPHEN 5-325 MG PO TABS: 1.0000 | ORAL_TABLET | Freq: Four times a day (QID) | ORAL | 0 refills | Status: DC | PRN

## 2022-10-28 NOTE — ED Triage Notes (Signed)
Patient reports he was watching tv on the floor Thursday, April 18th and his right leg/foot went numb. When he went to stand he fell due to numbness in right leg/foot and injured his right ankle. Swelling present.   Patient ambulates with limp due to right leg numbness tingling and reports right leg seems weaker.   Reports LKW sometimes Thursday before sitting on the floor to watch TV

## 2022-10-28 NOTE — ED Provider Notes (Signed)
[facility] Provider Note    Event Date/Time   First MD Initiated Contact with Patient 10/28/22 1542     (approximate)   History   Foot Injury and Numbness   HPI  Derrick Barajas is a 67 y.o. male reports on Thursday night he was watching professional wrestling at his house.  He was sitting on the floor, and had his leg underneath him sort of crosslegged in bed in awkward position for 2 hours.  During that time he noticed his right leg was falling asleep, but did not reposition.  When he went to get up his right foot was "asleep" and he went to go stand it caused him to fall.  Did not strike his head.  Injury also reports he started twisted his ankle and the bottom of his right foot has been sore since then.  The sensation in his right foot came back after he got up off the floor, but he did have a bit of a sense of numbness in the foot that lasted a very brief period.  Since that time has been having to sort of limp but still putting some weight on the right foot for the last 2 days.  He drove himself to the ER.  Denies any headache.  No numbness or weakness ongoing in any extremity including the right foot.  Reports all sensation in the right foot went back to normal, except it feels very achy and painful to bear weight on especially around the heel and base of the foot  No speech changes.  No weakness in the face arms or legs.  He does report a distant history of stroke and is compliant with his medications     Physical Exam   Triage Vital Signs: ED Triage Vitals  Enc Vitals Group     BP 10/28/22 1533 138/67     Pulse Rate 10/28/22 1533 74     Resp 10/28/22 1533 18     Temp 10/28/22 1535 97.6 F (36.4 C)     Temp Source 10/28/22 1533 Oral     SpO2 10/28/22 1533 99 %     Weight 10/28/22 1533 270 lb (122.5 kg)     Height 10/28/22 1533  (1.702 m)     Head Circumference --      Peak Flow --      Pain Score 10/28/22 1533 6     Pain Loc --       Pain Edu? --      Excl. in GC? --     Most recent vital signs: Vitals:   10/28/22 1533 10/28/22 1535  BP: 138/67   Pulse: 74   Resp: 18   Temp:  97.6 F (36.4 C)  SpO2: 99%      General: Awake, no distress.  Normocephalic atraumatic. CV:  Good peripheral perfusion.  Normal tones.  Strong Doppler positive dorsalis pedis and posterior tibial in the right foot along with normal capillary refill all toes the right foot. Resp:  Normal effort.  Abd:  No distention.  Other:   Mild peripheral edema in both feet.  Slight edema and tenderness to palpation over the midfoot and medial left ankle joint/midfoot.  He is able to wiggle his toes plantar and dorsiflex the right foot and also flex and extend at the knee and hip with normal strength.  He does report when palpating along the base of his right foot and providing axial pressure tenderness in the  base of the right foot.  He has normal sensation able to discern normal touch over the lower extremity including right foot.     ED Results / Procedures / Treatments   Labs (all labs ordered are listed, but only abnormal results are displayed) Labs Reviewed - No data to display   EKG  Head interpreted by me at 1535 heart rate 75 QRS 90 QTc 400 Normal sinus rhythm, no evidence of acute ischemia.  Possible old anteroseptal infarct.  Voltage is somewhat low precordial.    RADIOLOGY X-rays right foot and ankle interpreted by me as negative for acute fracture    PROCEDURES:  Critical Care performed: No  Procedures   MEDICATIONS ORDERED IN ED: Medications - No data to display   IMPRESSION / MDM / ASSESSMENT AND PLAN / ED COURSE  I reviewed the triage vital signs and the nursing notes.                              Differential diagnosis includes, but is not limited to, likely or possible brief neuropathy of the right foot, or possible brief ischemia of the right foot due to positioning, no clinical signs or symptoms to suggest  central causation with reassuring neurologic exam at this time.  He reports all the symptoms of numbness in the foot and loss of sensation went away after he got up from the floor.  However when he did his foot was weak caused him to sort of fall on it.  He now has pain in the midfoot on the right.  No skin lesion.  Reviewed outpatient records, patient sees Dr. Ellsworth Lennox, history of diabetes.  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  Patient's presentation is most consistent with acute complicated illness / injury requiring diagnostic workup.   ----------------------------------------- 5:03 PM on 10/28/2022 ----------------------------------------- Imaging negative for fracture.  Clinical exam and history most likely suggestive of a foot or ankle strain or sprain.  I did discuss with the patient I recommend follow-up and reevaluation about 1 week.  He sees Dr. Excell Seltzer podiatry and is agreeable to calling and setting up a follow-up with him.  He also advises he has 1 coming up early next month, but will set up more acute visit in the interim to follow-up on his foot.  It is possible he could have a subtle nondisplaced fracture, placed into Aircast with good effect good perfusion post placement into the Aircast.  Patient reports no comfortable with plan for discharge, weightbearing as tolerated, follow-up with podiatry.  Provided prescription for hydrocodone.  Discussed safe use, not driving while taking, no driving within 8 hours, safe storage etc.  Patient in agreement  Return precautions and treatment recommendations and follow-up discussed with the patient who is agreeable with the plan.        FINAL CLINICAL IMPRESSION(S) / ED DIAGNOSES   Final diagnoses:  Right foot sprain, initial encounter     Rx / DC Orders    ED Discharge Orders          Ordered    HYDROcodone-acetaminophen (NORCO/VICODIN) 5-325 MG tablet  Every 6 hours PRN        10/28/22 1702             Note:  This document was prepared using Dragon voice recognition  software and may include unintentional dictation errors.   Sharyn Creamer, MD 10/28/22 [phone]

## 2022-11-02 ENCOUNTER — Ambulatory Visit (INDEPENDENT_AMBULATORY_CARE_PROVIDER_SITE_OTHER): Payer: 59 | Admitting: Internal Medicine

## 2022-11-02 ENCOUNTER — Encounter: Payer: Self-pay | Admitting: Internal Medicine

## 2022-11-02 VITALS — BP 114/68 | HR 78 | Ht 67.0 in | Wt 277.8 lb

## 2022-11-02 DIAGNOSIS — E119 Type 2 diabetes mellitus without complications: Secondary | ICD-10-CM

## 2022-11-02 DIAGNOSIS — E782 Mixed hyperlipidemia: Secondary | ICD-10-CM

## 2022-11-02 DIAGNOSIS — Z794 Long term (current) use of insulin: Secondary | ICD-10-CM | POA: Diagnosis not present

## 2022-11-02 NOTE — Progress Notes (Signed)
Established Patient Office Visit  Subjective:  Patient ID: Derrick Barajas, male    DOB: September 07, 1955  Age: 67 y.o. MRN: [id number]  Chief Complaint  Patient presents with   Follow-up    3 month follow up    No new complaints, here for lab review and medication refills. Labs reviewed and notable for poorly controlled diabetes, A1c not at target although lipids at target. Denies any hypoglycemic episodes and home bg readings have been at target.     No other concerns at this time.   Past Medical History:  Diagnosis Date   CAD (coronary artery disease)    CHF (congestive heart failure) (HCC)    Diabetes mellitus without complication (HCC)    Gout    Hyperlipidemia    Hypertension    MI (myocardial infarction) (HCC)    RA (rheumatoid arthritis) (HCC)    Sleep apnea    Stroke (HCC)    left facal droop   Thyroid disease     Past Surgical History:  Procedure Laterality Date   CIRCUMCISION     CORONARY ANGIOPLASTY WITH STENT PLACEMENT     CORONARY STENT INTERVENTION N/A 11/17/2018   Procedure: CORONARY STENT INTERVENTION;  Surgeon: Alwyn Pea, MD;  Location: [facility] INVASIVE CV LAB;  Service: Cardiovascular;  Laterality: N/A;   LEFT HEART CATH AND CORONARY ANGIOGRAPHY Left 11/17/2018   Procedure: LEFT HEART CATH AND CORONARY ANGIOGRAPHY;  Surgeon: Laurier Nancy, MD;  Location: [facility] INVASIVE CV LAB;  Service: Cardiovascular;  Laterality: Left;   TONSILLECTOMY      Social History   Socioeconomic History   Marital status: Legally Separated    Spouse name: Not on file   Number of children: Not on file   Years of education: Not on file   Highest education level: Not on file  Occupational History   Not on file  Tobacco Use   Smoking status: Never   Smokeless tobacco: Never  Vaping Use   Vaping Use: Never used  Substance and Sexual Activity   Alcohol use: Yes    Alcohol/week: 1.0 standard drink of alcohol    Types: 1 Cans of beer per week    Comment: once a week    Drug use: No   Sexual activity: Yes    Partners: Female    Birth control/protection: None  Other Topics Concern   Not on file  Social History Narrative   Not on file   Social Determinants of Health   Financial Resource Strain: Low Risk  (02/06/2022)   Overall Financial Resource Strain (CARDIA)    Difficulty of Paying Living Expenses: Not hard at all  Food Insecurity: No Food Insecurity (03/15/2022)   Hunger Vital Sign    Worried About Running Out of Food in the Last Year: Never true    Ran Out of Food in the Last Year: Never true  Transportation Needs: Unmet Transportation Needs (03/15/2022)   PRAPARE - Administrator, Civil Service (Medical): Yes    Lack of Transportation (Non-Medical): Yes  Physical Activity: Not on file  Stress: Not on file  Social Connections: Not on file  Intimate Partner Violence: Not At Risk (03/07/2022)   Humiliation, Afraid, Rape, and Kick questionnaire    Fear of Current or Ex-Partner: No    Emotionally Abused: No    Physically Abused: No    Sexually Abused: No    Family History  Problem Relation Age of Onset   Diabetes Mother  Diabetes Brother     Allergies  Allergen Reactions   Shellfish-Derived Products Other (See Comments)    Other reaction(Wallace Gappa): Nausea/Vomiting/Diarrhea, Shock/Unconsciousness   Sulfa Antibiotics Other (See Comments)    Other reaction(Shewanda Sharpe): Nausea/Vomiting/Diarrhea, Shock/Unconsciousness   Iodine Nausea And Vomiting   Atorvastatin Other (See Comments)   Iodinated Contrast Media Other (See Comments)   Nsaids Other (See Comments)    D/t cardiac meds   Shrimp [Shellfish Allergy] Nausea And Vomiting    Review of Systems  Constitutional: Negative.   HENT: Negative.    Respiratory: Negative.    Cardiovascular: Negative.   Gastrointestinal: Negative.   Skin: Negative.   Neurological:  Negative for dizziness.  All other systems reviewed and are negative.      Objective:   BP 114/68   Pulse 78   Ht 5\' 7"   (1.702 m)   Wt 277 lb 12.8 oz (126 kg)   SpO2 96%   BMI 43.51 kg/m   Vitals:   11/02/22 1409  BP: 114/68  Pulse: 78  Height: 5\' 7"  (1.702 m)  Weight: 277 lb 12.8 oz (126 kg)  SpO2: 96%  BMI (Calculated): 43.5    Physical Exam Vitals reviewed.  Constitutional:      Appearance: Normal appearance. He is obese.  HENT:     Head: Normocephalic.     Left Ear: There is no impacted cerumen.     Nose: Nose normal.     Mouth/Throat:     Mouth: Mucous membranes are moist.     Pharynx: No posterior oropharyngeal erythema.  Eyes:     Extraocular Movements: Extraocular movements intact.     Pupils: Pupils are equal, round, and reactive to light.  Cardiovascular:     Rate and Rhythm: Regular rhythm.     Chest Wall: PMI is not displaced.     Pulses: Normal pulses.     Heart sounds: Normal heart sounds. No murmur heard. Pulmonary:     Effort: Pulmonary effort is normal.     Breath sounds: Normal air entry. No rhonchi or rales.  Abdominal:     General: Abdomen is flat. Bowel sounds are normal. There is no distension.     Palpations: Abdomen is soft. There is no hepatomegaly, splenomegaly or mass.     Tenderness: There is no abdominal tenderness.  Musculoskeletal:        General: Normal range of motion.     Cervical back: Normal range of motion and neck supple.     Right lower leg: No edema.     Left lower leg: No edema.     Comments: Right ankle boot in situ  Skin:    General: Skin is warm and dry.  Neurological:     General: No focal deficit present.     Mental Status: He is alert and oriented to person, place, and time.     Cranial Nerves: No cranial nerve deficit.     Motor: No weakness.  Psychiatric:        Mood and Affect: Mood normal.        Behavior: Behavior normal.      No results found for any visits on 11/02/22.  Recent Results (from the past 2160 hour(Dillard Pascal))  Lipid Panel w/o Chol/HDL Ratio     Status: None   Collection Time: 10/23/22  9:13 AM  Result Value Ref  Range   Cholesterol, Total 118 100 - 199 mg/dL   Triglycerides 95 0 - 149 mg/dL   HDL 43 >16  mg/dL   VLDL Cholesterol Cal 18 5 - 40 mg/dL   LDL Chol Calc (NIH) 57 0 - 99 mg/dL  Hgb A2Z w/o eAG     Status: Abnormal   Collection Time: 10/23/22  9:13 AM  Result Value Ref Range   Hgb A1c MFr Bld 7.8 (H) 4.8 - 5.6 %    Comment:          Prediabetes: 5.7 - 6.4          Diabetes: >6.4          Glycemic control for adults with diabetes: <7.0   TSH     Status: None   Collection Time: 10/23/22  9:13 AM  Result Value Ref Range   TSH 1.140 0.450 - 4.500 uIU/mL      Assessment & Plan:   Problem List Items Addressed This Visit       Endocrine   Diabetes mellitus type 2, uncomplicated (HCC)   Relevant Orders   Fructosamine     Other   Hyperlipidemia, mixed - Primary    Return in about 6 weeks (around 12/14/2022) for fu with labs prior.   Total time spent: 20 minutes  Luna Fuse, MD  11/02/2022

## 2022-11-07 ENCOUNTER — Other Ambulatory Visit: Payer: Self-pay | Admitting: Cardiovascular Disease

## 2022-11-09 ENCOUNTER — Telehealth: Payer: Self-pay
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
## 2022-11-12 ENCOUNTER — Ambulatory Visit: Payer: 59 | Admitting: Internal Medicine

## 2022-11-13 ENCOUNTER — Other Ambulatory Visit: Payer: Self-pay | Admitting: Cardiovascular Disease

## 2022-11-13 ENCOUNTER — Ambulatory Visit: Payer: 59 | Admitting: Internal Medicine

## 2022-11-13 DIAGNOSIS — R609 Edema, unspecified: Secondary | ICD-10-CM

## 2022-11-14 ENCOUNTER — Ambulatory Visit: Payer: 59 | Admitting: Internal Medicine

## 2022-11-15 ENCOUNTER — Other Ambulatory Visit: Payer: Self-pay | Admitting: Cardiovascular Disease

## 2022-11-16 ENCOUNTER — Other Ambulatory Visit: Payer: Self-pay | Admitting: Internal Medicine

## 2022-11-16 ENCOUNTER — Other Ambulatory Visit: Payer: Self-pay

## 2022-11-16 MED ORDER — SPIRONOLACTONE 25 MG PO TABS: 25.0000 mg | ORAL_TABLET | Freq: Every day | ORAL | 1 refills | Status: DC

## 2022-11-30 ENCOUNTER — Other Ambulatory Visit: Payer: Self-pay | Admitting: Internal Medicine

## 2022-12-14 ENCOUNTER — Ambulatory Visit: Payer: 59 | Admitting: Internal Medicine

## 2022-12-24 ENCOUNTER — Ambulatory Visit: Payer: 59

## 2022-12-24 NOTE — Chronic Care Management (AMB) (Signed)
Follow Up Pharmacist Visit (CCM)  Clinical Summary Next Pharmacist Follow Up: 04/01/23 2PM Next AWV: to be scheduled Summary for PCP:  - Recommended alarms and reminders for taking medicines. Patient is potentially non adherent. - Recommended Shingles Shot - Recommended continuing Diet and exercise consistently. . Patient Chart Prep (HC) Chronic Conditions Patient's Chronic Conditions: Cardiovascular Disease (CVD), Diabetes (DM), Heart Failure, Hyperlipidemia/Dyslipidemia (HLD), Hypertension (HTN), Hypothyroidism Doctor and Hospital Visits Were there PCP Visits since last visit with the Pharmacist?: No Were there Specialist Visits since last visit with the Pharmacist?: No Was there a Hospital Visit in last 30 days?: No Were there other Hospital Visits since last visit with the Pharmacist?: No Engagement Notes Lynann Bologna on 11/09/2022 10:48 AM  dimock, eugenia on 12/18/2022 02:00 PM   CCM Billed Time: HC Chart Prep: 15 minutes (12/18/22)   Bostick, Ashlea on 09/20/2022 08:01 AM 12/24/2022 Pre-Call Questions (HC) Pre-Call Questions Date:: 12/21/2022 Time:: AM Outcome:: Unsuccessful - No answer Engagement Notes thompson, joni on 12/21/2022 11:05 AM NONBILLABLE TIME: 0:02:00 Disease Assessments Visit Date Visit Completed on: 12/24/2022 Subjective Information Lifestyle habits: Beverages are sugar and caffeine free Food is either cooked or take out. Dairy products all kinds. Snacks are popcorn, chips. [place] meats for lunch and dinner. Exercise: some gymand bike ride Alcohol: Beer once a week. What is the patient's sleep pattern?: Trouble falling asleep, Other (with free form text) Details: twice a night for bathroom or water. day time naps 1-2 hrs. SDOH: Accountable Health Communities Health-Related Social Needs Screening Tool ([employer]) SDOH questions were documented in [facility] within the past 12 months or  since hospitalization?: No Are you completing SDOH today?: Yes What is your living situation today? (ref #1): I have a steady place to live Think about the place you live. Do you have problems with any of the following? (ref #2): None of the above Within the past 12 months, the food you bought just didn't last and you didn't have money to get more (ref #4): Never true In the past 12 months, has lack of reliable transportation kept you from medical appointments, meetings, work or from getting things needed for daily living? (ref #5): Yes In the past 12 months, has the electric, gas, oil, or water company threatened to shut off services in your home? (ref #6): No How often does anyone, including family and friends, physically hurt you? (ref #7): Never (1) How often does anyone, including family and friends, insult or talk down to you? (ref #8): Never (1) How often does anyone, including friends and family, threaten you with harm? (ref #9): Never (1) How often does anyone, including family and friends, scream or curse at you? (ref #10): Never (1) Medication Adherence Does the [facility] have access to medication refill history?: No Is Patient using [facility] pharmacy?: No Name and location of Current pharmacy: [facility] Current Rx insurance plan: UHC Are meds delivered by current pharmacy?: Yes - by mail order pharmacy Assessment:: Potentially Non-adherent Hypertension (HTN) Most Recent BP: 110/70 Most Recent HR: 82 taken on: 10/03/2022 Care Gap: Need BP documented or last BP 140/90 or higher: Addressed Assessed today?: Yes Goal: <130/80 mmHG Is Patient checking BP at home?: No Has patient experienced hypotension, dizziness, falls or bradycardia?: No We discussed: DASH diet:  following a diet emphasizing fruits and vegetables and low-fat dairy products along with whole grains, fish, poultry, and nuts. Reducing red meats and sugars., Contacting PCP office for signs and symptoms of high or low blood  pressure (hypotension, dizziness,  falls, headaches, edema) Assessment:: Controlled Drug: Amlodipine 10mg  qd Pharmacist Assessment: Appropriate, Effective, Safe, Accessible Drug: Imdur 60mg  qd Pharmacist Assessment: Appropriate, Effective, Safe, Accessible Drug: Lisinopril 40mg  qam Pharmacist Assessment: Appropriate, Effective, Safe, Accessible Drug: Metoprolol XL 200mg  qd Pharmacist Assessment: Appropriate, Effective, Safe, Accessible Hyperlipidemia/Dyslipidemia (HLD) Last Lipid panel on: 10/23/2022 TC (Goal<200): 118 LDL: 57 HDL (Goal>40): 43 TG (Goal<150): 95 ASCVD 10-year risk?is:: N/A due to lab values outside the range Assessed today?: No Drug: Ezetimibe 10mg  qd Drug: Rosuvastatin 20mg  qd Diabetes (DM) Most recent A1C: 7.7% taken on: 11/16/2022 Most Recent GFR: 47 taken on: 11/16/2022 Type: 2 Most recent microalbumin ratio: <5.0 tested on: 11/16/2022 Care Gap: Statin therapy needed: Addressed Care Gap: Need A1c documented or last A1c > 9 %: Addressed Care Gap: Need eye exam documented in EMR or by claim: Needs to be addressed Care Gap: Need eGFR and uACR for kidney health evaluation: Addressed Assessed today?: Yes Goal A1C: < 7.0 % Type: 2 Is Patient taking statin medication: Yes Is patient taking ACEi / ARB?: Yes Blood glucose monitoring: Glucometer How often does patient check Blood Sugars?: 2-4 times a day  Recent fasting Blood sugar reading: 107 Recent pre-meal Blood sugar reading: 135 Recent bedtime Blood sugar reading: 200 Has patient experienced any hypoglycemic episodes?: yes, sometimes he gets sweaty, clammy We discussed: Low carbohydrate eating plan with an emphasis on whole grains, legumes, nuts, fruits, and vegetables and minimal refined and processed foods., Increasing exercise (walking, biking, swimming) to a goal of 30 minutes per day, as able based on current activity level and health, Fasting and post-prandial blood glucose goals, Avoiding both  sugar-sweetened and artificially (aspartame) sweetened beverages and increase water intake. Assessment:: Uncontrolled Drug: Jardiance 25mg  qam Pharmacist Assessment: Appropriate, Query Effectiveness Drug: Mounjaro 7.5mg  once a week on wednesday Pharmacist Assessment: Appropriate, Query Effectiveness Drug:  Evaristo Bury Flextouch, 120 units BID Pharmacist Assessment: Appropriate, Query Effectiveness Plan/Follow up: Order and review : A1C every 3 months Heart Failure Most recent Echo with EF: 50% taken on: 02/04/2017 Assessed today?: No Drug: Furosemide 20mg  BID Drug: Spironolactone 25mg  qd Cardiovascular Disease (CVD) Care Gap: Moderate / high intensity statin therapy needed: Addressed Assessed today?: No Drug: Aspirin 81mg  qd Drug: Prasugrel 10mg  qd Drug: Ranolazine 500mg  BID Hypothyroidism Most recent TSH: 1.056 taken on: 11/16/2022 Assessed today?: No Drug: Levothyroxine qd Preventative Health Care Gap: Colorectal cancer screening: Addressed Care Gap: Breast cancer screening: Patient excluded from population (Age > 75, hx of bilateral mastectomy, frailty, hospice services) Care Gap: Annual Wellness Visit (AWV): Needs to be addressed Immunizations needed: Zoster, Pneumococcal Additional diet counseling points. We discussed: aiming to consume at least 8 cups of water day Pharmacist Interventions Intervention Details Pharmacist Interventions discussed: Yes Started Therapy: Immunizations recommended Monitoring: Routine monitoring Education: Lifestyle modifications . Lynann Bologna, PharmD Chart review and Office visit Documentation 

## 2023-01-01 ENCOUNTER — Other Ambulatory Visit: Payer: Self-pay | Admitting: Cardiovascular Disease

## 2023-01-08 ENCOUNTER — Other Ambulatory Visit: Payer: Self-pay | Admitting: Cardiovascular Disease

## 2023-01-08 ENCOUNTER — Ambulatory Visit (INDEPENDENT_AMBULATORY_CARE_PROVIDER_SITE_OTHER): Payer: 59 | Admitting: Internal Medicine

## 2023-01-08 VITALS — BP 115/65 | HR 87 | Ht 67.0 in | Wt 273.2 lb

## 2023-01-08 DIAGNOSIS — E038 Other specified hypothyroidism: Secondary | ICD-10-CM

## 2023-01-08 DIAGNOSIS — Z794 Long term (current) use of insulin: Secondary | ICD-10-CM

## 2023-01-08 DIAGNOSIS — E782 Mixed hyperlipidemia: Secondary | ICD-10-CM | POA: Diagnosis not present

## 2023-01-08 DIAGNOSIS — E119 Type 2 diabetes mellitus without complications: Secondary | ICD-10-CM

## 2023-01-08 DIAGNOSIS — I10 Essential (primary) hypertension: Secondary | ICD-10-CM | POA: Diagnosis not present

## 2023-01-08 LAB — POC CREATINE & ALBUMIN,URINE
Albumin/Creatinine Ratio, Urine, POC: <30
Creatinine, POC: 50 mg/dL
Microalbumin Ur, POC: 10 mg/L

## 2023-01-08 LAB — POCT CBG (FASTING - GLUCOSE)-MANUAL ENTRY: Glucose Fasting, POC: 144 mg/dL — AB (ref 70–99)

## 2023-01-08 NOTE — Progress Notes (Signed)
Established Patient Office Visit  Subjective:  Patient ID: Derrick Barajas, male    DOB: 04/04/1956  Age: 67 y.o. MRN: [id number]  Chief Complaint  Patient presents with   Follow-up    Lab F/U    No new complaints, here for lab review and medication refills. Labs reviewed and notable for uncontrolled diabetes,  A1c not at target. Admits to hypoglycemic episodes and home bg readings have not been at target.  Insulin dose increased by endocrinologist.      No other concerns at this time.   Past Medical History:  Diagnosis Date   CAD (coronary artery disease)    CHF (congestive heart failure) (HCC)    Diabetes mellitus without complication (HCC)    Gout    Hyperlipidemia    Hypertension    MI (myocardial infarction) (HCC)    RA (rheumatoid arthritis) (HCC)    Sleep apnea    Stroke (HCC)    left facal droop   Thyroid disease     Past Surgical History:  Procedure Laterality Date   CIRCUMCISION     CORONARY ANGIOPLASTY WITH STENT PLACEMENT     CORONARY STENT INTERVENTION N/A 11/17/2018   Procedure: CORONARY STENT INTERVENTION;  Surgeon: Alwyn Pea, MD;  Location: [facility] INVASIVE CV LAB;  Service: Cardiovascular;  Laterality: N/A;   LEFT HEART CATH AND CORONARY ANGIOGRAPHY Left 11/17/2018   Procedure: LEFT HEART CATH AND CORONARY ANGIOGRAPHY;  Surgeon: Laurier Nancy, MD;  Location: [facility] INVASIVE CV LAB;  Service: Cardiovascular;  Laterality: Left;   TONSILLECTOMY      Social History   Socioeconomic History   Marital status: Legally Separated    Spouse name: Not on file   Number of children: Not on file   Years of education: Not on file   Highest education level: Not on file  Occupational History   Not on file  Tobacco Use   Smoking status: Never   Smokeless tobacco: Never  Vaping Use   Vaping Use: Never used  Substance and Sexual Activity   Alcohol use: Yes    Alcohol/week: 1.0 standard drink of alcohol    Types: 1 Cans of beer per week    Comment: once  a week   Drug use: No   Sexual activity: Yes    Partners: Female    Birth control/protection: None  Other Topics Concern   Not on file  Social History Narrative   Not on file   Social Determinants of Health   Financial Resource Strain: Low Risk  (02/06/2022)   Overall Financial Resource Strain (CARDIA)    Difficulty of Paying Living Expenses: Not hard at all  Food Insecurity: No Food Insecurity (03/15/2022)   Hunger Vital Sign    Worried About Running Out of Food in the Last Year: Never true    Ran Out of Food in the Last Year: Never true  Transportation Needs: Unmet Transportation Needs (03/15/2022)   PRAPARE - Administrator, Civil Service (Medical): Yes    Lack of Transportation (Non-Medical): Yes  Physical Activity: Not on file  Stress: Not on file  Social Connections: Not on file  Intimate Partner Violence: Not At Risk (03/07/2022)   Humiliation, Afraid, Rape, and Kick questionnaire    Fear of Current or Ex-Partner: No    Emotionally Abused: No    Physically Abused: No    Sexually Abused: No    Family History  Problem Relation Age of Onset   Diabetes Mother  Diabetes Brother     Allergies  Allergen Reactions   Shellfish-Derived Products Other (See Comments)    Other reaction(Shital Crayton): Nausea/Vomiting/Diarrhea, Shock/Unconsciousness   Sulfa Antibiotics Other (See Comments)    Other reaction(Hermenegildo Clausen): Nausea/Vomiting/Diarrhea, Shock/Unconsciousness   Iodine Nausea And Vomiting   Atorvastatin Other (See Comments)   Iodinated Contrast Media Other (See Comments)   Nsaids Other (See Comments)    D/t cardiac meds   Shrimp [Shellfish Allergy] Nausea And Vomiting    Review of Systems  Constitutional: Negative.   HENT: Negative.    Respiratory: Negative.    Cardiovascular: Negative.   Gastrointestinal: Negative.   Skin: Negative.   Neurological:  Negative for dizziness.  All other systems reviewed and are negative.      Objective:   BP 115/65   Pulse 87    Ht 5\' 7"  (1.702 m)   Wt 273 lb 3.2 oz (123.9 kg)   SpO2 98%   BMI 42.79 kg/m   Vitals:   01/08/23 0925  BP: 115/65  Pulse: 87  Height: 5\' 7"  (1.702 m)  Weight: 273 lb 3.2 oz (123.9 kg)  SpO2: 98%  BMI (Calculated): 42.78    Physical Exam Vitals reviewed.  Constitutional:      Appearance: Normal appearance. He is obese.  HENT:     Head: Normocephalic.     Left Ear: There is no impacted cerumen.     Nose: Nose normal.     Mouth/Throat:     Mouth: Mucous membranes are moist.     Pharynx: No posterior oropharyngeal erythema.  Eyes:     Extraocular Movements: Extraocular movements intact.     Pupils: Pupils are equal, round, and reactive to light.  Cardiovascular:     Rate and Rhythm: Regular rhythm.     Chest Wall: PMI is not displaced.     Pulses: Normal pulses.     Heart sounds: Normal heart sounds. No murmur heard. Pulmonary:     Effort: Pulmonary effort is normal.     Breath sounds: Normal air entry. No rhonchi or rales.  Abdominal:     General: Abdomen is flat. Bowel sounds are normal. There is no distension.     Palpations: Abdomen is soft. There is no hepatomegaly, splenomegaly or mass.     Tenderness: There is no abdominal tenderness.  Musculoskeletal:        General: Normal range of motion.     Cervical back: Normal range of motion and neck supple.     Right lower leg: No edema.     Left lower leg: No edema.     Comments: Right ankle boot in situ  Skin:    General: Skin is warm and dry.  Neurological:     General: No focal deficit present.     Mental Status: He is alert and oriented to person, place, and time.     Cranial Nerves: No cranial nerve deficit.     Motor: No weakness.  Psychiatric:        Mood and Affect: Mood normal.        Behavior: Behavior normal.      Results for orders placed or performed in visit on 01/08/23  POCT CBG (Fasting - Glucose)  Result Value Ref Range   Glucose Fasting, POC 144 (A) 70 - 99 mg/dL    Recent Results  (from the past 2160 hour(Alethia Melendrez))  Lipid Panel w/o Chol/HDL Ratio     Status: None   Collection Time: 10/23/22  9:13 AM  Result Value Ref Range   Cholesterol, Total 118 100 - 199 mg/dL   Triglycerides 95 0 - 149 mg/dL   HDL 43 >69 mg/dL   VLDL Cholesterol Cal 18 5 - 40 mg/dL   LDL Chol Calc (NIH) 57 0 - 99 mg/dL  Hgb C7E w/o eAG     Status: Abnormal   Collection Time: 10/23/22  9:13 AM  Result Value Ref Range   Hgb A1c MFr Bld 7.8 (H) 4.8 - 5.6 %    Comment:          Prediabetes: 5.7 - 6.4          Diabetes: >6.4          Glycemic control for adults with diabetes: <7.0   TSH     Status: None   Collection Time: 10/23/22  9:13 AM  Result Value Ref Range   TSH 1.140 0.450 - 4.500 uIU/mL  POCT CBG (Fasting - Glucose)     Status: Abnormal   Collection Time: 01/08/23  9:32 AM  Result Value Ref Range   Glucose Fasting, POC 144 (A) 70 - 99 mg/dL      Assessment & Plan:  As per problem list. Stricter low calorie diet, low cholesterol and low fat diet and exercise as much as possible.  Problem List Items Addressed This Visit       Cardiovascular and Mediastinum   Hypertension   Relevant Orders   Comprehensive metabolic panel     Endocrine   Diabetes mellitus type 2, uncomplicated (HCC) - Primary   Relevant Orders   POCT CBG (Fasting - Glucose) (Completed)   Hemoglobin A1c   POC CREATINE & ALBUMIN,URINE   Other specified hypothyroidism   Relevant Orders   TSH     Other   Hyperlipidemia, mixed   Relevant Orders   Lipid panel   Comprehensive metabolic panel   CK    Return in about 3 months (around 04/10/2023) for fu with labs prior.   Total time spent: 20 minutes  Luna Fuse, MD  01/08/2023   This document may have been prepared by [facility] Voice Recognition software and as such may include unintentional dictation errors.

## 2023-01-13 ENCOUNTER — Other Ambulatory Visit: Payer: Self-pay | Admitting: Cardiovascular Disease

## 2023-01-13 DIAGNOSIS — I200 Unstable angina: Secondary | ICD-10-CM

## 2023-01-15 ENCOUNTER — Other Ambulatory Visit: Payer: Self-pay | Admitting: Cardiovascular Disease

## 2023-01-15 DIAGNOSIS — R609 Edema, unspecified: Secondary | ICD-10-CM

## 2023-01-25 ENCOUNTER — Other Ambulatory Visit: Payer: Self-pay | Admitting: Internal Medicine

## 2023-01-27 ENCOUNTER — Other Ambulatory Visit: Payer: Self-pay | Admitting: Internal Medicine

## 2023-02-04 ENCOUNTER — Encounter: Payer: Self-pay | Admitting: Cardiology

## 2023-02-04 ENCOUNTER — Ambulatory Visit: Payer: 59 | Admitting: Cardiovascular Disease

## 2023-02-04 VITALS — BP 116/74 | HR 70 | Ht 67.0 in | Wt 276.4 lb

## 2023-02-04 DIAGNOSIS — N6323 Unspecified lump in the left breast, lower outer quadrant: Secondary | ICD-10-CM

## 2023-02-04 DIAGNOSIS — I10 Essential (primary) hypertension: Secondary | ICD-10-CM | POA: Diagnosis not present

## 2023-02-04 DIAGNOSIS — I509 Heart failure, unspecified: Secondary | ICD-10-CM | POA: Diagnosis not present

## 2023-02-04 DIAGNOSIS — Z955 Presence of coronary angioplasty implant and graft: Secondary | ICD-10-CM

## 2023-02-04 DIAGNOSIS — E782 Mixed hyperlipidemia: Secondary | ICD-10-CM

## 2023-02-04 DIAGNOSIS — I2510 Atherosclerotic heart disease of native coronary artery without angina pectoris: Secondary | ICD-10-CM

## 2023-02-04 DIAGNOSIS — E6689 Other obesity not elsewhere classified: Secondary | ICD-10-CM

## 2023-02-04 DIAGNOSIS — E668 Other obesity: Secondary | ICD-10-CM

## 2023-02-04 NOTE — Assessment & Plan Note (Signed)
10/23/22 LDL 57. Continue rosuvastatin 20 mg daily.

## 2023-02-04 NOTE — Assessment & Plan Note (Signed)
Echo 06/2022 EF 59%, grade I DD. Denies shortness of breath. Continue current therapy.

## 2023-02-04 NOTE — Assessment & Plan Note (Signed)
The patient is asked to make an attempt to improve diet and exercise patterns to aid in medical management of this problem.  

## 2023-02-04 NOTE — Assessment & Plan Note (Signed)
Lump in left lower quadrant of left breast. Will order an ultrasound.

## 2023-02-04 NOTE — Assessment & Plan Note (Signed)
Patient denies chest pain, shortness of breath. CCTA 03/2021 Calcium score: 1012.5, stent in distal LAD patent. stent in mid RCA patent. LCX has no significant disease.

## 2023-02-04 NOTE — Progress Notes (Signed)
Cardiology Office Note   Date:  02/04/2023   ID:  Derrick Barajas, DOB 1955-08-31, MRN [id number]  PCP:  Sherron Monday, MD  Cardiologist:  Marisue Ivan, NP      History of Present Illness: Derrick Barajas is a 67 y.o. male who presents for  Chief Complaint  Patient presents with   Follow-up    4 month follow up    Patient in office for routine cardiac exam. Denies chest pain, shortness of breath, dizziness, lower extremity edema.  Patient states since his echocardiogram done in 06/2022, he has had a painful lump in his left breast. Lump is painful on touch only.       Past Medical History:  Diagnosis Date   CAD (coronary artery disease)    CHF (congestive heart failure) (HCC)    Diabetes mellitus without complication (HCC)    Gout    Hyperlipidemia    Hypertension    MI (myocardial infarction) (HCC)    RA (rheumatoid arthritis) (HCC)    Sleep apnea    Stroke (HCC)    left facal droop   Thyroid disease      Past Surgical History:  Procedure Laterality Date   CIRCUMCISION     CORONARY ANGIOPLASTY WITH STENT PLACEMENT     CORONARY STENT INTERVENTION N/A 11/17/2018   Procedure: CORONARY STENT INTERVENTION;  Surgeon: Alwyn Pea, MD;  Location: [facility] INVASIVE CV LAB;  Service: Cardiovascular;  Laterality: N/A;   LEFT HEART CATH AND CORONARY ANGIOGRAPHY Left 11/17/2018   Procedure: LEFT HEART CATH AND CORONARY ANGIOGRAPHY;  Surgeon: Laurier Nancy, MD;  Location: [facility] INVASIVE CV LAB;  Service: Cardiovascular;  Laterality: Left;   TONSILLECTOMY       Current Outpatient Medications  Medication Sig Dispense Refill   ACCU-CHEK GUIDE test strip USE WITH DEVICE TO CHECK SUGARS  3 TIMES DAILY 300 strip 2   amLODipine (NORVASC) 10 MG tablet TAKE 1 TABLET BY MOUTH DAILY 100 tablet 2   aspirin EC 81 MG tablet Take 81 mg by mouth daily.     B-D ULTRAFINE III SHORT PEN 31G X 8 MM MISC USE WITH PEN TWICE DAILY AS  DIRECTED 200 each 3   Blood Glucose Monitoring  Suppl (ACCU-CHEK GUIDE) w/Device KIT USE WITH SUPPLIES TO CHECK BLOOD SUGAR 3 TIMES DAILY 100 kit 3   Colchicine 0.6 MG CAPS Take 1 capsule by mouth daily.     docusate sodium (COLACE) 100 MG capsule Take 100 mg at bedtime by mouth.      ezetimibe (ZETIA) 10 MG tablet Take 1 tablet by mouth once daily 90 tablet 0   fluticasone (FLONASE) 50 MCG/ACT nasal spray Place 1 spray into both nostrils daily.  1   furosemide (LASIX) 20 MG tablet TAKE 1 TABLET BY MOUTH TWICE  DAILY 180 tablet 3   isosorbide mononitrate (IMDUR) 60 MG 24 hr tablet Take 1 tablet by mouth once daily 90 tablet 0   JARDIANCE 25 MG TABS tablet Take 1 tablet (25 mg total) by mouth every morning. 90 tablet 1   levothyroxine (SYNTHROID) 150 MCG tablet TAKE 1 TABLET BY MOUTH DAILY  BEFORE BREAKFAST 100 tablet 2   lisinopril (ZESTRIL) 40 MG tablet TAKE 1 TABLET BY MOUTH DAILY 100 tablet 2   metoprolol (TOPROL-XL) 200 MG 24 hr tablet TAKE 1 TABLET BY MOUTH DAILY 90 tablet 3   MOUNJARO 7.5 MG/0.5ML Pen Inject into the skin.     nitroGLYCERIN (NITROSTAT) 0.4 MG SL  tablet Place 0.4 mg under the tongue every 5 (five) minutes as needed for chest pain.     olopatadine (PATANOL) 0.1 % ophthalmic solution Place 1 drop into both eyes daily.     prasugrel (EFFIENT) 10 MG TABS tablet TAKE 1 TABLET BY MOUTH ONCE  DAILY 90 tablet 1   ranolazine (RANEXA) 500 MG 12 hr tablet TAKE 1 TABLET BY MOUTH TWICE  DAILY 160 tablet 3   rosuvastatin (CRESTOR) 20 MG tablet TAKE 1 TABLET BY MOUTH DAILY 100 tablet 2   spironolactone (ALDACTONE) 25 MG tablet Take 1 tablet (25 mg total) by mouth daily. 90 tablet 1   TRESIBA FLEXTOUCH 200 UNIT/ML FlexTouch Pen ADMINISTER 130 UNITS  SUBCUTANEOUSLY TWICE DAILY AS  DIRECTED 108 mL 3   No current facility-administered medications for this visit.    Allergies:   Shellfish-derived products, Sulfa antibiotics, Iodine, Atorvastatin, Iodinated contrast media, Nsaids, and Shrimp [shellfish allergy]    Social History:    reports that he has never smoked. He has never used smokeless tobacco. He reports current alcohol use of about 1.0 standard drink of alcohol per week. He reports that he does not use drugs.   Family History:  family history includes Diabetes in his brother and mother.    ROS:     Review of Systems  Constitutional: Negative.   HENT: Negative.    Eyes: Negative.   Respiratory: Negative.    Cardiovascular: Negative.   Gastrointestinal: Negative.   Genitourinary: Negative.   Musculoskeletal: Negative.   Skin: Negative.   Neurological: Negative.   Endo/Heme/Allergies: Negative.   Psychiatric/Behavioral: Negative.    All other systems reviewed and are negative.     All other systems are reviewed and negative.    PHYSICAL EXAM: VS:  BP 116/74   Pulse 70   Ht 5\' 7"  (1.702 m)   Wt 276 lb 6.4 oz (125.4 kg)   SpO2 95%   BMI 43.29 kg/m  , BMI Body mass index is 43.29 kg/m. Last weight:  Wt Readings from Last 3 Encounters:  02/04/23 276 lb 6.4 oz (125.4 kg)  01/08/23 273 lb 3.2 oz (123.9 kg)  11/02/22 277 lb 12.8 oz (126 kg)     Physical Exam Vitals and nursing note reviewed.  Constitutional:      Appearance: Normal appearance. He is normal weight.  HENT:     Head: Normocephalic and atraumatic.     Nose: Nose normal.     Mouth/Throat:     Mouth: Mucous membranes are moist.     Pharynx: Oropharynx is clear.  Eyes:     Extraocular Movements: Extraocular movements intact.     Conjunctiva/sclera: Conjunctivae normal.     Pupils: Pupils are equal, round, and reactive to light.  Cardiovascular:     Rate and Rhythm: Normal rate and regular rhythm.     Pulses: Normal pulses.     Heart sounds: Normal heart sounds.  Pulmonary:     Effort: Pulmonary effort is normal.     Breath sounds: Normal breath sounds.  Chest:  Breasts:    Left: Mass and tenderness present.    Abdominal:     General: Abdomen is flat. Bowel sounds are normal.     Palpations: Abdomen is soft.   Musculoskeletal:        General: Normal range of motion.     Cervical back: Normal range of motion.  Skin:    General: Skin is warm and dry.  Neurological:     General:  No focal deficit present.     Mental Status: He is alert and oriented to person, place, and time. Mental status is at baseline.  Psychiatric:        Mood and Affect: Mood normal.        Behavior: Behavior normal.        Thought Content: Thought content normal.        Judgment: Judgment normal.      EKG: none today  Recent Labs: 10/23/2022: TSH 1.140    Lipid Panel    Component Value Date/Time   CHOL 118 10/23/2022 0913   CHOL 139 08/29/2011 0351   TRIG 95 10/23/2022 0913   TRIG 185 08/29/2011 0351   HDL 43 10/23/2022 0913   HDL 38 (L) 08/29/2011 0351   CHOLHDL 2.6 11/18/2018 0352   VLDL 19 11/18/2018 0352   VLDL 37 08/29/2011 0351   LDLCALC 57 10/23/2022 0913   LDLCALC 64 08/29/2011 0351      ASSESSMENT AND PLAN:    ICD-10-CM   1. Hypertension, unspecified type  I10     2. Atherosclerosis of native coronary artery of native heart without angina pectoris  I25.10     3. Chronic congestive heart failure, unspecified heart failure type (HCC)  I50.9     4. Hyperlipidemia, mixed  E78.2     5. S/P drug eluting coronary stent placement  Z95.5     6. Other obesity  E66.8     7. Mass of lower outer quadrant of left breast  N63.23 [place] LIMITED ULTRASOUND INCLUDING AXILLA LEFT BREAST        Problem List Items Addressed This Visit       Cardiovascular and Mediastinum   Hypertension - Primary    Well controlled. Continue current therapy.       Coronary atherosclerosis    Patient denies chest pain, shortness of breath. CCTA 03/2021 Calcium score: 1012.5, stent in distal LAD patent. stent in mid RCA patent. LCX has no significant disease.       Congestive heart failure (HCC)    Echo 06/2022 EF 59%, grade I DD. Denies shortness of breath. Continue current therapy.         Other   S/P drug  eluting coronary stent placement   Hyperlipidemia, mixed    10/23/22 LDL 57. Continue rosuvastatin 20 mg daily.       Other obesity    The patient is asked to make an attempt to improve diet and exercise patterns to aid in medical management of this problem.      Mass of lower outer quadrant of left breast    Lump in left lower quadrant of left breast. Will order an ultrasound.      Relevant Orders   [place] LIMITED ULTRASOUND INCLUDING AXILLA LEFT BREAST      Disposition:   Return in about 4 months (around 06/07/2023).    Total time spent: 30 minutes  Signed,  Marisue Ivan, NP  02/04/2023 10:47 AM    [facility]

## 2023-02-04 NOTE — Assessment & Plan Note (Signed)
Well controlled Continue current therapy 

## 2023-02-11 ENCOUNTER — Other Ambulatory Visit: Payer: Self-pay | Admitting: Cardiovascular Disease

## 2023-02-12 ENCOUNTER — Other Ambulatory Visit: Payer: Self-pay | Admitting: Nurse Practitioner

## 2023-02-27 ENCOUNTER — Ambulatory Visit (INDEPENDENT_AMBULATORY_CARE_PROVIDER_SITE_OTHER): Payer: 59

## 2023-02-27 DIAGNOSIS — N6323 Unspecified lump in the left breast, lower outer quadrant: Secondary | ICD-10-CM

## 2023-03-01 NOTE — Progress Notes (Signed)
Spoke with pt who verbalized understanding.

## 2023-03-06 ENCOUNTER — Other Ambulatory Visit: Payer: Self-pay | Admitting: Cardiovascular Disease

## 2023-03-20 ENCOUNTER — Other Ambulatory Visit: Payer: Self-pay

## 2023-04-01 ENCOUNTER — Ambulatory Visit: Payer: 59

## 2023-04-05 ENCOUNTER — Other Ambulatory Visit: Payer: 59

## 2023-04-05 DIAGNOSIS — E038 Other specified hypothyroidism: Secondary | ICD-10-CM

## 2023-04-05 DIAGNOSIS — E119 Type 2 diabetes mellitus without complications: Secondary | ICD-10-CM

## 2023-04-05 DIAGNOSIS — E782 Mixed hyperlipidemia: Secondary | ICD-10-CM

## 2023-04-05 DIAGNOSIS — I10 Essential (primary) hypertension: Secondary | ICD-10-CM

## 2023-04-06 LAB — LIPID PANEL
Chol/HDL Ratio: 3 {ratio} (ref 0.0–5.0)
Cholesterol, Total: 122 mg/dL (ref 100–199)
HDL: 41 mg/dL (ref >39)
LDL Chol Calc (NIH): 58 mg/dL (ref 0–99)
Triglycerides: 127 mg/dL (ref 0–149)
VLDL Cholesterol Cal: 23 mg/dL (ref 5–40)

## 2023-04-06 LAB — COMPREHENSIVE METABOLIC PANEL
ALT: 14 [IU]/L (ref 0–44)
AST: 14 [IU]/L (ref 0–40)
Albumin: 4 g/dL (ref 3.9–4.9)
Alkaline Phosphatase: 66 [IU]/L (ref 44–121)
BUN/Creatinine Ratio: 17 (ref 10–24)
BUN: 29 mg/dL — ABNORMAL HIGH (ref 8–27)
Bilirubin Total: 0.3 mg/dL (ref 0.0–1.2)
CO2: 22 mmol/L (ref 20–29)
Calcium: 9.4 mg/dL (ref 8.6–10.2)
Chloride: 104 mmol/L (ref 96–106)
Creatinine, Ser: 1.71 mg/dL — ABNORMAL HIGH (ref 0.76–1.27)
Globulin, Total: 2.4 g/dL (ref 1.5–4.5)
Glucose: 66 mg/dL — ABNORMAL LOW (ref 70–99)
Potassium: 4.6 mmol/L (ref 3.5–5.2)
Sodium: 142 mmol/L (ref 134–144)
Total Protein: 6.4 g/dL (ref 6.0–8.5)
eGFR: 43 mL/min/{1.73_m2} — ABNORMAL LOW (ref >59)

## 2023-04-06 LAB — CK: Total CK: 212 U/L (ref 41–331)

## 2023-04-06 LAB — TSH: TSH: 0.779 u[IU]/mL (ref 0.450–4.500)

## 2023-04-06 LAB — HEMOGLOBIN A1C
Est. average glucose Bld gHb Est-mCnc: 169 mg/dL
Hgb A1c MFr Bld: 7.5 % — ABNORMAL HIGH (ref 4.8–5.6)

## 2023-04-10 ENCOUNTER — Ambulatory Visit (INDEPENDENT_AMBULATORY_CARE_PROVIDER_SITE_OTHER): Payer: 59 | Admitting: Internal Medicine

## 2023-04-10 ENCOUNTER — Encounter: Payer: Self-pay | Admitting: Internal Medicine

## 2023-04-10 ENCOUNTER — Other Ambulatory Visit: Payer: Self-pay | Admitting: Cardiovascular Disease

## 2023-04-10 VITALS — BP 108/70 | HR 77 | Ht 67.0 in | Wt 271.0 lb

## 2023-04-10 DIAGNOSIS — E782 Mixed hyperlipidemia: Secondary | ICD-10-CM | POA: Diagnosis not present

## 2023-04-10 DIAGNOSIS — N1832 Chronic kidney disease, stage 3b: Secondary | ICD-10-CM | POA: Diagnosis not present

## 2023-04-10 DIAGNOSIS — Z794 Long term (current) use of insulin: Secondary | ICD-10-CM | POA: Diagnosis not present

## 2023-04-10 DIAGNOSIS — E038 Other specified hypothyroidism: Secondary | ICD-10-CM | POA: Diagnosis not present

## 2023-04-10 DIAGNOSIS — E119 Type 2 diabetes mellitus without complications: Secondary | ICD-10-CM

## 2023-04-10 LAB — POCT CBG (FASTING - GLUCOSE)-MANUAL ENTRY: Glucose Fasting, POC: 106 mg/dL — AB (ref 70–99)

## 2023-04-10 MED ORDER — KERENDIA 10 MG PO TABS
10.0000 mg | ORAL_TABLET | Freq: Every day | ORAL | 0 refills | Status: AC
Start: 2023-04-10 — End: 2023-05-10

## 2023-04-10 NOTE — Progress Notes (Signed)
Established Patient Office Visit  Subjective:  Patient ID: Derrick Barajas, male    DOB: 08/20/1955  Age: 67 y.o. MRN: [id number]  Chief Complaint  Patient presents with   Follow-up    3 month follow up, discuss lab results.    No new complaints, here for lab review and medication refills. Labs reviewed and notable for uncontrolled diabetes, A1c improved but not at target, lipids at target with unremarkable cmp. A few hypoglycemic episodes and home bg readings have been elevated mainly at night. Interval deterioration in renal function. TSH at target with normal LFT'Felesia Stahlecker and CK.      No other concerns at this time.   Past Medical History:  Diagnosis Date   CAD (coronary artery disease)    CHF (congestive heart failure) (HCC)    Diabetes mellitus without complication (HCC)    Gout    Hyperlipidemia    Hypertension    MI (myocardial infarction) (HCC)    RA (rheumatoid arthritis) (HCC)    Sleep apnea    Stroke (HCC)    left facal droop   Thyroid disease     Past Surgical History:  Procedure Laterality Date   CIRCUMCISION     CORONARY ANGIOPLASTY WITH STENT PLACEMENT     CORONARY STENT INTERVENTION N/A 11/17/2018   Procedure: CORONARY STENT INTERVENTION;  Surgeon: Alwyn Pea, MD;  Location: [facility];  Service: Cardiovascular;  Laterality: N/A;   LEFT HEART CATH AND CORONARY ANGIOGRAPHY Left 11/17/2018   Procedure: LEFT HEART CATH AND CORONARY ANGIOGRAPHY;  Surgeon: Laurier Nancy, MD;  Location: [facility];  Service: Cardiovascular;  Laterality: Left;   TONSILLECTOMY      Social History   Socioeconomic History   Marital status: Legally Separated    Spouse name: Not on file   Number of children: Not on file   Years of education: Not on file   Highest education level: Not on file  Occupational History   Not on file  Tobacco Use   Smoking status: Never   Smokeless tobacco: Never  Vaping Use   Vaping status: Never Used  Substance and  Sexual Activity   Alcohol use: Yes    Alcohol/week: 1.0 standard drink of alcohol    Types: 1 Cans of beer per week    Comment: once a week   Drug use: No   Sexual activity: Yes    Partners: Female    Birth control/protection: None  Other Topics Concern   Not on file  Social History Narrative   Not on file   Social Determinants of Health   Financial Resource Strain: Low Risk  (02/06/2022)   Overall Financial Resource Strain (CARDIA)    Difficulty of Paying Living Expenses: Not hard at all  Food Insecurity: No Food Insecurity (03/15/2022)   Hunger Vital Sign    Worried About Running Out of Food in the Last Year: Never true    Ran Out of Food in the Last Year: Never true  Transportation Needs: Unmet Transportation Needs (03/15/2022)   PRAPARE - Administrator, Civil Service (Medical): Yes    Lack of Transportation (Non-Medical): Yes  Physical Activity: Not on file  Stress: Not on file  Social Connections: Not on file  Intimate Partner Violence: Not At Risk (03/07/2022)   Humiliation, Afraid, Rape, and Kick questionnaire    Fear of Current or Ex-Partner: No    Emotionally Abused: No    Physically Abused: No  Sexually Abused: No    Family History  Problem Relation Age of Onset   Diabetes Mother    Diabetes Brother     Allergies  Allergen Reactions   Shellfish-Derived Products Other (See Comments)    Other reaction(Shayonna Ocampo): Nausea/Vomiting/Diarrhea, Shock/Unconsciousness   Sulfa Antibiotics Other (See Comments)    Other reaction(Hunter Bachar): Nausea/Vomiting/Diarrhea, Shock/Unconsciousness   Iodine Nausea And Vomiting   Atorvastatin Other (See Comments)   Iodinated Contrast Media Other (See Comments)   Nsaids Other (See Comments)    D/t cardiac meds   Shrimp [Shellfish Allergy] Nausea And Vomiting    Review of Systems  Constitutional:  Positive for weight loss.  HENT: Negative.    Respiratory: Negative.    Cardiovascular: Negative.   Gastrointestinal: Negative.    Skin: Negative.   Neurological:  Negative for dizziness.  All other systems reviewed and are negative.      Objective:   BP 108/70   Pulse 77   Ht 5\' 7"  (1.702 m)   Wt 271 lb (122.9 kg)   SpO2 96%   BMI 42.44 kg/m   Vitals:   04/10/23 0818  BP: 108/70  Pulse: 77  Height: 5\' 7"  (1.702 m)  Weight: 271 lb (122.9 kg)  SpO2: 96%  BMI (Calculated): 42.43    Physical Exam Vitals reviewed.  Constitutional:      Appearance: Normal appearance. He is obese.  HENT:     Head: Normocephalic.     Left Ear: There is no impacted cerumen.     Nose: Nose normal.     Mouth/Throat:     Mouth: Mucous membranes are moist.     Pharynx: No posterior oropharyngeal erythema.  Eyes:     Extraocular Movements: Extraocular movements intact.     Pupils: Pupils are equal, round, and reactive to light.  Cardiovascular:     Rate and Rhythm: Regular rhythm.     Chest Wall: PMI is not displaced.     Pulses: Normal pulses.     Heart sounds: Normal heart sounds. No murmur heard. Pulmonary:     Effort: Pulmonary effort is normal.     Breath sounds: Normal air entry. No rhonchi or rales.  Abdominal:     General: Abdomen is flat. Bowel sounds are normal. There is no distension.     Palpations: Abdomen is soft. There is no hepatomegaly, splenomegaly or mass.     Tenderness: There is no abdominal tenderness.  Musculoskeletal:        General: Normal range of motion.     Cervical back: Normal range of motion and neck supple.     Right lower leg: No edema.     Left lower leg: No edema.     Comments: Right ankle boot in situ  Skin:    General: Skin is warm and dry.  Neurological:     General: No focal deficit present.     Mental Status: He is alert and oriented to person, place, and time.     Cranial Nerves: No cranial nerve deficit.     Motor: No weakness.  Psychiatric:        Mood and Affect: Mood normal.        Behavior: Behavior normal.      Results for orders placed or performed in  visit on 04/10/23  POCT CBG (Fasting - Glucose)  Result Value Ref Range   Glucose Fasting, POC 106 (A) 70 - 99 mg/dL    Recent Results (from the past 2160 hour(Iriana Artley))  Hemoglobin A1c     Status: Abnormal   Collection Time: 04/05/23  1:52 PM  Result Value Ref Range   Hgb A1c MFr Bld 7.5 (H) 4.8 - 5.6 %    Comment:          Prediabetes: 5.7 - 6.4          Diabetes: >6.4          Glycemic control for adults with diabetes: <7.0    Est. average glucose Bld gHb Est-mCnc 169 mg/dL  Lipid panel     Status: None   Collection Time: 04/05/23  1:52 PM  Result Value Ref Range   Cholesterol, Total 122 100 - 199 mg/dL   Triglycerides 644 0 - 149 mg/dL   HDL 41 >03 mg/dL   VLDL Cholesterol Cal 23 5 - 40 mg/dL   LDL Chol Calc (NIH) 58 0 - 99 mg/dL   Chol/HDL Ratio 3.0 0.0 - 5.0 ratio    Comment:                                   T. Chol/HDL Ratio                                             Men  Women                               1/2 Avg.Risk  3.4    3.3                                   Avg.Risk  5.0    4.4                                2X Avg.Risk  9.6    7.1                                3X Avg.Risk 23.4   11.0   Comprehensive metabolic panel     Status: Abnormal   Collection Time: 04/05/23  1:52 PM  Result Value Ref Range   Glucose 66 (L) 70 - 99 mg/dL   BUN 29 (H) 8 - 27 mg/dL   Creatinine, Ser 4.74 (H) 0.76 - 1.27 mg/dL   eGFR 43 (L) >25 ZD/GLO/7.56   BUN/Creatinine Ratio 17 10 - 24   Sodium 142 134 - 144 mmol/L   Potassium 4.6 3.5 - 5.2 mmol/L   Chloride 104 96 - 106 mmol/L   CO2 22 20 - 29 mmol/L   Calcium 9.4 8.6 - 10.2 mg/dL   Total Protein 6.4 6.0 - 8.5 g/dL   Albumin 4.0 3.9 - 4.9 g/dL   Globulin, Total 2.4 1.5 - 4.5 g/dL   Bilirubin Total 0.3 0.0 - 1.2 mg/dL   Alkaline Phosphatase 66 44 - 121 IU/L   AST 14 0 - 40 IU/L   ALT 14 0 - 44 IU/L  CK     Status: None   Collection Time: 04/05/23  1:52 PM  Result Value Ref Range   Total CK 212 41 -  331 U/L  TSH     Status:  None   Collection Time: 04/05/23  1:52 PM  Result Value Ref Range   TSH 0.779 0.450 - 4.500 uIU/mL  POCT CBG (Fasting - Glucose)     Status: Abnormal   Collection Time: 04/10/23  8:26 AM  Result Value Ref Range   Glucose Fasting, POC 106 (A) 70 - 99 mg/dL      Assessment & Plan:  As per problem list. Defer rx adjustment to Endo tomorrow. Stricter low calorie diet, low cholesterol and low fat diet and exercise as much as possible. Discuss with Cardiology to dc spironolactone before possibly adding Kerendia to preserve renal function.  Problem List Items Addressed This Visit       Endocrine   Diabetes mellitus type 2, uncomplicated (HCC) - Primary   Relevant Orders   POCT CBG (Fasting - Glucose) (Completed)   Other specified hypothyroidism     Genitourinary   CKD stage 3b, GFR 30-44 ml/min (HCC)     Other   Hyperlipidemia, mixed    Return in about 6 weeks (around 05/22/2023) for fu with labs prior.   Total time spent: 20 minutes  Luna Fuse, MD  04/10/2023   This document may have been prepared by [facility] Voice Recognition software and as such may include unintentional dictation errors.

## 2023-05-03 ENCOUNTER — Telehealth: Payer: Self-pay | Admitting: Internal Medicine

## 2023-05-03 NOTE — Telephone Encounter (Signed)
Patient left VM that he got his kidney medication but wants [place] to give him a call back.

## 2023-05-21 ENCOUNTER — Other Ambulatory Visit: Payer: Self-pay | Admitting: Cardiovascular Disease

## 2023-05-24 ENCOUNTER — Ambulatory Visit (INDEPENDENT_AMBULATORY_CARE_PROVIDER_SITE_OTHER): Payer: 59 | Admitting: Internal Medicine

## 2023-05-24 VITALS — BP 139/81 | HR 89 | Ht 67.0 in | Wt 271.4 lb

## 2023-05-24 DIAGNOSIS — Z794 Long term (current) use of insulin: Secondary | ICD-10-CM | POA: Diagnosis not present

## 2023-05-24 DIAGNOSIS — N1832 Chronic kidney disease, stage 3b: Secondary | ICD-10-CM | POA: Diagnosis not present

## 2023-05-24 DIAGNOSIS — E119 Type 2 diabetes mellitus without complications: Secondary | ICD-10-CM

## 2023-05-24 DIAGNOSIS — Z23 Encounter for immunization: Secondary | ICD-10-CM | POA: Diagnosis not present

## 2023-05-24 DIAGNOSIS — Z0130 Encounter for examination of blood pressure without abnormal findings: Secondary | ICD-10-CM

## 2023-05-24 LAB — POCT CBG (FASTING - GLUCOSE)-MANUAL ENTRY: Glucose Fasting, POC: 68 mg/dL — AB (ref 70–99)

## 2023-05-24 NOTE — Progress Notes (Signed)
Established Patient Office Visit  Subjective:  Patient ID: Derrick Barajas, male    DOB: 03/21/1956  Age: 67 y.o. MRN: [id number]  No chief complaint on file.   No new complaints, here for lab review and medication refills. Failed to have previsit labs done but home bg readings have been well controlled on 80% of his readings.     No other concerns at this time.   Past Medical History:  Diagnosis Date   CAD (coronary artery disease)    CHF (congestive heart failure) (HCC)    Diabetes mellitus without complication (HCC)    Gout    Hyperlipidemia    Hypertension    MI (myocardial infarction) (HCC)    RA (rheumatoid arthritis) (HCC)    Sleep apnea    Stroke (HCC)    left facal droop   Thyroid disease     Past Surgical History:  Procedure Laterality Date   CIRCUMCISION     CORONARY ANGIOPLASTY WITH STENT PLACEMENT     CORONARY STENT INTERVENTION N/A 11/17/2018   Procedure: CORONARY STENT INTERVENTION;  Surgeon: Alwyn Pea, MD;  Location: [facility];  Service: Cardiovascular;  Laterality: N/A;   LEFT HEART CATH AND CORONARY ANGIOGRAPHY Left 11/17/2018   Procedure: LEFT HEART CATH AND CORONARY ANGIOGRAPHY;  Surgeon: Laurier Nancy, MD;  Location: [facility];  Service: Cardiovascular;  Laterality: Left;   TONSILLECTOMY      Social History   Socioeconomic History   Marital status: Legally Separated    Spouse name: Not on file   Number of children: Not on file   Years of education: Not on file   Highest education level: Not on file  Occupational History   Not on file  Tobacco Use   Smoking status: Never   Smokeless tobacco: Never  Vaping Use   Vaping status: Never Used  Substance and Sexual Activity   Alcohol use: Yes    Alcohol/week: 1.0 standard drink of alcohol    Types: 1 Cans of beer per week    Comment: once a week   Drug use: No   Sexual activity: Yes    Partners: Female    Birth control/protection: None  Other Topics Concern    Not on file  Social History Narrative   Not on file   Social Determinants of Health   Financial Resource Strain: Low Risk  (02/06/2022)   Overall Financial Resource Strain (CARDIA)    Difficulty of Paying Living Expenses: Not hard at all  Food Insecurity: No Food Insecurity (03/15/2022)   Hunger Vital Sign    Worried About Running Out of Food in the Last Year: Never true    Ran Out of Food in the Last Year: Never true  Transportation Needs: Unmet Transportation Needs (03/15/2022)   PRAPARE - Administrator, Civil Service (Medical): Yes    Lack of Transportation (Non-Medical): Yes  Physical Activity: Not on file  Stress: Not on file  Social Connections: Not on file  Intimate Partner Violence: Not At Risk (03/07/2022)   Humiliation, Afraid, Rape, and Kick questionnaire    Fear of Current or Ex-Partner: No    Emotionally Abused: No    Physically Abused: No    Sexually Abused: No    Family History  Problem Relation Age of Onset   Diabetes Mother    Diabetes Brother     Allergies  Allergen Reactions   Shellfish-Derived Products Other (See Comments)    Other  reaction(Lorel Lembo): Nausea/Vomiting/Diarrhea, Shock/Unconsciousness   Sulfa Antibiotics Other (See Comments)    Other reaction(Lilo Wallington): Nausea/Vomiting/Diarrhea, Shock/Unconsciousness   Iodine Nausea And Vomiting   Atorvastatin Other (See Comments)   Iodinated Contrast Media Other (See Comments)   Nsaids Other (See Comments)    D/t cardiac meds   Shrimp [Shellfish Allergy] Nausea And Vomiting    Outpatient Medications Prior to Visit  Medication Sig   amLODipine (NORVASC) 10 MG tablet TAKE 1 TABLET BY MOUTH DAILY   Colchicine 0.6 MG CAPS Take 1 capsule by mouth daily.   ezetimibe (ZETIA) 10 MG tablet Take 1 tablet by mouth once daily   furosemide (LASIX) 20 MG tablet TAKE 1 TABLET BY MOUTH TWICE  DAILY   isosorbide mononitrate (IMDUR) 60 MG 24 hr tablet Take 1 tablet by mouth once daily   JARDIANCE 25 MG TABS tablet  TAKE 1 TABLET BY MOUTH EVERY  MORNING   KERENDIA 10 MG TABS Take 1 tablet by mouth daily.   levothyroxine (SYNTHROID) 150 MCG tablet TAKE 1 TABLET BY MOUTH DAILY  BEFORE BREAKFAST   lisinopril (ZESTRIL) 40 MG tablet TAKE 1 TABLET BY MOUTH DAILY   metoprolol (TOPROL-XL) 200 MG 24 hr tablet TAKE 1 TABLET BY MOUTH DAILY   MOUNJARO 7.5 MG/0.5ML Pen Inject into the skin.   olopatadine (PATANOL) 0.1 % ophthalmic solution Place 1 drop into both eyes daily.   prasugrel (EFFIENT) 10 MG TABS tablet TAKE 1 TABLET BY MOUTH ONCE  DAILY   ranolazine (RANEXA) 500 MG 12 hr tablet TAKE 1 TABLET BY MOUTH TWICE  DAILY   rosuvastatin (CRESTOR) 20 MG tablet TAKE 1 TABLET BY MOUTH DAILY   TRESIBA FLEXTOUCH 200 UNIT/ML FlexTouch Pen ADMINISTER 130 UNITS  SUBCUTANEOUSLY TWICE DAILY AS  DIRECTED (Patient taking differently: 110 Units.)   ACCU-CHEK GUIDE test strip USE WITH DEVICE TO CHECK SUGARS  3 TIMES DAILY   aspirin EC 81 MG tablet Take 81 mg by mouth daily.   B-D ULTRAFINE III SHORT PEN 31G X 8 MM MISC USE WITH PEN TWICE DAILY AS  DIRECTED   Blood Glucose Monitoring Suppl (ACCU-CHEK GUIDE) w/Device KIT USE WITH SUPPLIES TO CHECK BLOOD SUGAR 3 TIMES DAILY (Patient not taking: Reported on 04/10/2023)   docusate sodium (COLACE) 100 MG capsule Take 100 mg at bedtime by mouth.    fluticasone (FLONASE) 50 MCG/ACT nasal spray Place 1 spray into both nostrils daily. (Patient not taking: Reported on 04/10/2023)   nitroGLYCERIN (NITROSTAT) 0.4 MG SL tablet Place 0.4 mg under the tongue every 5 (five) minutes as needed for chest pain. (Patient not taking: Reported on 04/10/2023)   No facility-administered medications prior to visit.    Review of Systems  Constitutional:  Negative for weight loss.  HENT: Negative.    Respiratory: Negative.    Cardiovascular: Negative.   Gastrointestinal: Negative.   Skin: Negative.   Neurological:  Negative for dizziness.  All other systems reviewed and are negative.      Objective:    BP 139/81   Pulse 89   Ht 5\' 7"  (1.702 m)   Wt 271 lb 6.4 oz (123.1 kg)   SpO2 97%   BMI 42.51 kg/m   Vitals:   05/24/23 1411  BP: 139/81  Pulse: 89  Height: 5\' 7"  (1.702 m)  Weight: 271 lb 6.4 oz (123.1 kg)  SpO2: 97%  BMI (Calculated): 42.5    Physical Exam Vitals reviewed.  Constitutional:      Appearance: Normal appearance. He is obese.  HENT:  Head: Normocephalic.     Left Ear: There is no impacted cerumen.     Nose: Nose normal.     Mouth/Throat:     Mouth: Mucous membranes are moist.     Pharynx: No posterior oropharyngeal erythema.  Eyes:     Extraocular Movements: Extraocular movements intact.     Pupils: Pupils are equal, round, and reactive to light.  Cardiovascular:     Rate and Rhythm: Regular rhythm.     Chest Wall: PMI is not displaced.     Pulses: Normal pulses.     Heart sounds: Normal heart sounds. No murmur heard. Pulmonary:     Effort: Pulmonary effort is normal.     Breath sounds: Normal air entry. No rhonchi or rales.  Abdominal:     General: Abdomen is flat. Bowel sounds are normal. There is no distension.     Palpations: Abdomen is soft. There is no hepatomegaly, splenomegaly or mass.     Tenderness: There is no abdominal tenderness.  Musculoskeletal:        General: Normal range of motion.     Cervical back: Normal range of motion and neck supple.     Right lower leg: No edema.     Left lower leg: No edema.     Comments: Right ankle boot in situ  Skin:    General: Skin is warm and dry.  Neurological:     General: No focal deficit present.     Mental Status: He is alert and oriented to person, place, and time.     Cranial Nerves: No cranial nerve deficit.     Motor: No weakness.  Psychiatric:        Mood and Affect: Mood normal.        Behavior: Behavior normal.      Results for orders placed or performed in visit on 05/24/23  POCT CBG (Fasting - Glucose)  Result Value Ref Range   Glucose Fasting, POC 68 (A) 70 - 99  mg/dL    Recent Results (from the past 2160 hour(Katerine Morua))  Hemoglobin A1c     Status: Abnormal   Collection Time: 04/05/23  1:52 PM  Result Value Ref Range   Hgb A1c MFr Bld 7.5 (H) 4.8 - 5.6 %    Comment:          Prediabetes: 5.7 - 6.4          Diabetes: >6.4          Glycemic control for adults with diabetes: <7.0    Est. average glucose Bld gHb Est-mCnc 169 mg/dL  Lipid panel     Status: None   Collection Time: 04/05/23  1:52 PM  Result Value Ref Range   Cholesterol, Total 122 100 - 199 mg/dL   Triglycerides 295 0 - 149 mg/dL   HDL 41 >62 mg/dL   VLDL Cholesterol Cal 23 5 - 40 mg/dL   LDL Chol Calc (NIH) 58 0 - 99 mg/dL   Chol/HDL Ratio 3.0 0.0 - 5.0 ratio    Comment:                                   T. Chol/HDL Ratio  Men  Women                               1/2 Avg.Risk  3.4    3.3                                   Avg.Risk  5.0    4.4                                2X Avg.Risk  9.6    7.1                                3X Avg.Risk 23.4   11.0   Comprehensive metabolic panel     Status: Abnormal   Collection Time: 04/05/23  1:52 PM  Result Value Ref Range   Glucose 66 (L) 70 - 99 mg/dL   BUN 29 (H) 8 - 27 mg/dL   Creatinine, Ser 8.11 (H) 0.76 - 1.27 mg/dL   eGFR 43 (L) >91 YN/WGN/5.62   BUN/Creatinine Ratio 17 10 - 24   Sodium 142 134 - 144 mmol/L   Potassium 4.6 3.5 - 5.2 mmol/L   Chloride 104 96 - 106 mmol/L   CO2 22 20 - 29 mmol/L   Calcium 9.4 8.6 - 10.2 mg/dL   Total Protein 6.4 6.0 - 8.5 g/dL   Albumin 4.0 3.9 - 4.9 g/dL   Globulin, Total 2.4 1.5 - 4.5 g/dL   Bilirubin Total 0.3 0.0 - 1.2 mg/dL   Alkaline Phosphatase 66 44 - 121 IU/L   AST 14 0 - 40 IU/L   ALT 14 0 - 44 IU/L  CK     Status: None   Collection Time: 04/05/23  1:52 PM  Result Value Ref Range   Total CK 212 41 - 331 U/L  TSH     Status: None   Collection Time: 04/05/23  1:52 PM  Result Value Ref Range   TSH 0.779 0.450 - 4.500 uIU/mL  POCT CBG  (Fasting - Glucose)     Status: Abnormal   Collection Time: 04/10/23  8:26 AM  Result Value Ref Range   Glucose Fasting, POC 106 (A) 70 - 99 mg/dL  POCT CBG (Fasting - Glucose)     Status: Abnormal   Collection Time: 05/24/23  2:20 PM  Result Value Ref Range   Glucose Fasting, POC 68 (A) 70 - 99 mg/dL      Assessment & Plan:  As per problem list  Problem List Items Addressed This Visit       Endocrine   Diabetes mellitus type 2, uncomplicated (HCC) - Primary   Relevant Orders   POCT CBG (Fasting - Glucose) (Completed)   Fructosamine   Comprehensive metabolic panel   Lipid panel   Hemoglobin A1c     Genitourinary   CKD stage 3b, GFR 30-44 ml/min (HCC)   Relevant Orders   BMP8+Anion Gap    Return in about 7 weeks (around 07/12/2023) for fu with labs prior.   Total time spent: 20 minutes  Luna Fuse, MD  05/24/2023   This document may have been prepared by [facility] Voice Recognition software and as such may include unintentional dictation errors.

## 2023-05-25 LAB — BMP8+ANION GAP
Anion Gap: 14 mmol/L (ref 10.0–18.0)
BUN/Creatinine Ratio: 14 (ref 10–24)
BUN: 23 mg/dL (ref 8–27)
CO2: 25 mmol/L (ref 20–29)
Calcium: 9.5 mg/dL (ref 8.6–10.2)
Chloride: 103 mmol/L (ref 96–106)
Creatinine, Ser: 1.64 mg/dL — ABNORMAL HIGH (ref 0.76–1.27)
Glucose: 60 mg/dL — ABNORMAL LOW (ref 70–99)
Potassium: 5.2 mmol/L (ref 3.5–5.2)
Sodium: 142 mmol/L (ref 134–144)
eGFR: 46 mL/min/{1.73_m2} — ABNORMAL LOW (ref >59)

## 2023-05-25 LAB — FRUCTOSAMINE: Fructosamine: 242 umol/L (ref 0–285)

## 2023-05-27 ENCOUNTER — Other Ambulatory Visit: Payer: Self-pay

## 2023-05-27 MED ORDER — KERENDIA 10 MG PO TABS: 1.0000 | ORAL_TABLET | Freq: Every day | ORAL | 0 refills | Status: DC

## 2023-05-27 NOTE — Addendum Note (Signed)
Addended by: Matthew Saras on: 05/27/2023 03:05 PM   Modules accepted: Orders

## 2023-05-29 ENCOUNTER — Telehealth: Payer: Self-pay | Admitting: Internal Medicine

## 2023-05-29 NOTE — Telephone Encounter (Signed)
Patient left VM and called to let us know that his Derrick Barajas did get approved.

## 2023-06-04 ENCOUNTER — Ambulatory Visit (INDEPENDENT_AMBULATORY_CARE_PROVIDER_SITE_OTHER): Payer: 59 | Admitting: Cardiovascular Disease

## 2023-06-04 ENCOUNTER — Encounter: Payer: Self-pay | Admitting: Cardiovascular Disease

## 2023-06-04 VITALS — BP 122/74 | HR 82 | Ht 67.0 in | Wt 273.2 lb

## 2023-06-04 DIAGNOSIS — I10 Essential (primary) hypertension: Secondary | ICD-10-CM

## 2023-06-04 DIAGNOSIS — I509 Heart failure, unspecified: Secondary | ICD-10-CM | POA: Diagnosis not present

## 2023-06-04 DIAGNOSIS — I219 Acute myocardial infarction, unspecified: Secondary | ICD-10-CM

## 2023-06-04 DIAGNOSIS — R0602 Shortness of breath: Secondary | ICD-10-CM

## 2023-06-04 DIAGNOSIS — Z955 Presence of coronary angioplasty implant and graft: Secondary | ICD-10-CM

## 2023-06-04 DIAGNOSIS — I2510 Atherosclerotic heart disease of native coronary artery without angina pectoris: Secondary | ICD-10-CM | POA: Diagnosis not present

## 2023-06-04 DIAGNOSIS — N1832 Chronic kidney disease, stage 3b: Secondary | ICD-10-CM

## 2023-06-04 NOTE — Progress Notes (Signed)
Cardiology Office Note   Date:  06/04/2023   ID:  Derrick Barajas, DOB 03/09/1956, MRN [id number]  PCP:  Sherron Monday, MD  Cardiologist:  Adrian Blackwater, MD      History of Present Illness: Derrick Barajas is a 67 y.o. male who presents for  Chief Complaint  Patient presents with   Follow-up    4 month follow up    Doing fine      Past Medical History:  Diagnosis Date   CAD (coronary artery disease)    CHF (congestive heart failure) (HCC)    Diabetes mellitus without complication (HCC)    Gout    Hyperlipidemia    Hypertension    MI (myocardial infarction) (HCC)    RA (rheumatoid arthritis) (HCC)    Sleep apnea    Stroke (HCC)    left facal droop   Thyroid disease      Past Surgical History:  Procedure Laterality Date   CIRCUMCISION     CORONARY ANGIOPLASTY WITH STENT PLACEMENT     CORONARY STENT INTERVENTION N/A 11/17/2018   Procedure: CORONARY STENT INTERVENTION;  Surgeon: Alwyn Pea, MD;  Location: [facility];  Service: Cardiovascular;  Laterality: N/A;   LEFT HEART CATH AND CORONARY ANGIOGRAPHY Left 11/17/2018   Procedure: LEFT HEART CATH AND CORONARY ANGIOGRAPHY;  Surgeon: Laurier Nancy, MD;  Location: [facility];  Service: Cardiovascular;  Laterality: Left;   TONSILLECTOMY       Current Outpatient Medications  Medication Sig Dispense Refill   ACCU-CHEK GUIDE test strip USE WITH DEVICE TO CHECK SUGARS  3 TIMES DAILY 300 strip 2   amLODipine (NORVASC) 10 MG tablet TAKE 1 TABLET BY MOUTH DAILY 100 tablet 2   aspirin EC 81 MG tablet Take 81 mg by mouth daily.     B-D ULTRAFINE III SHORT PEN 31G X 8 MM MISC USE WITH PEN TWICE DAILY AS  DIRECTED 200 each 3   Colchicine 0.6 MG CAPS Take 1 capsule by mouth daily.     docusate sodium (COLACE) 100 MG capsule Take 100 mg at bedtime by mouth.      ezetimibe (ZETIA) 10 MG tablet Take 1 tablet by mouth once daily 90 tablet 0   furosemide (LASIX) 20 MG tablet TAKE 1 TABLET BY  MOUTH TWICE  DAILY 180 tablet 3   isosorbide mononitrate (IMDUR) 60 MG 24 hr tablet Take 1 tablet by mouth once daily 90 tablet 0   JARDIANCE 25 MG TABS tablet TAKE 1 TABLET BY MOUTH EVERY  MORNING 100 tablet 2   KERENDIA 10 MG TABS Take 1 tablet (10 mg total) by mouth daily. 90 tablet 0   levothyroxine (SYNTHROID) 150 MCG tablet TAKE 1 TABLET BY MOUTH DAILY  BEFORE BREAKFAST 100 tablet 2   lisinopril (ZESTRIL) 40 MG tablet TAKE 1 TABLET BY MOUTH DAILY 100 tablet 2   metoprolol (TOPROL-XL) 200 MG 24 hr tablet TAKE 1 TABLET BY MOUTH DAILY 90 tablet 3   MOUNJARO 7.5 MG/0.5ML Pen Inject into the skin.     olopatadine (PATANOL) 0.1 % ophthalmic solution Place 1 drop into both eyes daily.     prasugrel (EFFIENT) 10 MG TABS tablet TAKE 1 TABLET BY MOUTH ONCE  DAILY 90 tablet 3   ranolazine (RANEXA) 500 MG 12 hr tablet TAKE 1 TABLET BY MOUTH TWICE  DAILY 160 tablet 3   rosuvastatin (CRESTOR) 20 MG tablet TAKE 1 TABLET BY MOUTH DAILY 100 tablet 2  TRESIBA FLEXTOUCH 200 UNIT/ML FlexTouch Pen ADMINISTER 130 UNITS  SUBCUTANEOUSLY TWICE DAILY AS  DIRECTED (Patient taking differently: 110 Units.) 108 mL 3   Blood Glucose Monitoring Suppl (ACCU-CHEK GUIDE) w/Device KIT USE WITH SUPPLIES TO CHECK BLOOD SUGAR 3 TIMES DAILY (Patient not taking: Reported on 04/10/2023) 100 kit 3   fluticasone (FLONASE) 50 MCG/ACT nasal spray Place 1 spray into both nostrils daily. (Patient not taking: Reported on 04/10/2023)  1   nitroGLYCERIN (NITROSTAT) 0.4 MG SL tablet Place 0.4 mg under the tongue every 5 (five) minutes as needed for chest pain. (Patient not taking: Reported on 04/10/2023)     No current facility-administered medications for this visit.    Allergies:   Shellfish-derived products, Sulfa antibiotics, Iodine, Atorvastatin, Iodinated contrast media, Nsaids, and Shrimp [shellfish allergy]    Social History:   reports that he has never smoked. He has never used smokeless tobacco. He reports current alcohol use of  about 1.0 standard drink of alcohol per week. He reports that he does not use drugs.   Family History:  family history includes Diabetes in his brother and mother.    ROS:     Review of Systems  Constitutional: Negative.   HENT: Negative.    Eyes: Negative.   Respiratory: Negative.    Gastrointestinal: Negative.   Genitourinary: Negative.   Musculoskeletal: Negative.   Skin: Negative.   Neurological: Negative.   Endo/Heme/Allergies: Negative.   Psychiatric/Behavioral: Negative.    All other systems reviewed and are negative.     All other systems are reviewed and negative.    PHYSICAL EXAM: VS:  BP 122/74   Pulse 82   Ht 5\' 7"  (1.702 m)   Wt 273 lb 3.2 oz (123.9 kg)   SpO2 95%   BMI 42.79 kg/m  , BMI Body mass index is 42.79 kg/m. Last weight:  Wt Readings from Last 3 Encounters:  06/04/23 273 lb 3.2 oz (123.9 kg)  05/24/23 271 lb 6.4 oz (123.1 kg)  04/10/23 271 lb (122.9 kg)     Physical Exam Vitals reviewed.  Constitutional:      Appearance: Normal appearance. He is normal weight.  HENT:     Head: Normocephalic.     Nose: Nose normal.     Mouth/Throat:     Mouth: Mucous membranes are moist.  Eyes:     Pupils: Pupils are equal, round, and reactive to light.  Cardiovascular:     Rate and Rhythm: Normal rate and regular rhythm.     Pulses: Normal pulses.     Heart sounds: Normal heart sounds.  Pulmonary:     Effort: Pulmonary effort is normal.  Abdominal:     General: Abdomen is flat. Bowel sounds are normal.  Musculoskeletal:        General: Normal range of motion.     Cervical back: Normal range of motion.  Skin:    General: Skin is warm.  Neurological:     General: No focal deficit present.     Mental Status: He is alert.  Psychiatric:        Mood and Affect: Mood normal.       EKG:   Recent Labs: 04/05/2023: ALT 14; TSH 0.779 05/24/2023: BUN 23; Creatinine, Ser 1.64; Potassium 5.2; Sodium 142    Lipid Panel    Component Value  Date/Time   CHOL 122 04/05/2023 1352   CHOL 139 08/29/2011 0351   TRIG 127 04/05/2023 1352   TRIG 185 08/29/2011 0351   HDL  41 04/05/2023 1352   HDL 38 (L) 08/29/2011 0351   CHOLHDL 3.0 04/05/2023 1352   CHOLHDL 2.6 11/18/2018 0352   VLDL 19 11/18/2018 0352   VLDL 37 08/29/2011 0351   LDLCALC 58 04/05/2023 1352   LDLCALC 64 08/29/2011 0351      Other studies Reviewed: Additional studies/ records that were reviewed today include:  Review of the above records demonstrates:       No data to display            ASSESSMENT AND PLAN:    ICD-10-CM   1. Hypertension, unspecified type  I10 PCV ECHOCARDIOGRAM COMPLETE    MYOCARDIAL PERFUSION IMAGING    2. Myocardial infarction, unspecified MI type, unspecified artery (HCC)  I21.9 PCV ECHOCARDIOGRAM COMPLETE    MYOCARDIAL PERFUSION IMAGING    3. Atherosclerosis of native coronary artery of native heart without angina pectoris  I25.10 PCV ECHOCARDIOGRAM COMPLETE    MYOCARDIAL PERFUSION IMAGING    4. Chronic congestive heart failure, unspecified heart failure type (HCC)  I50.9 PCV ECHOCARDIOGRAM COMPLETE    MYOCARDIAL PERFUSION IMAGING    5. CKD stage 3b, GFR 30-44 ml/min (HCC)  N18.32 PCV ECHOCARDIOGRAM COMPLETE    MYOCARDIAL PERFUSION IMAGING    6. S/P drug eluting coronary stent placement  Z95.5 PCV ECHOCARDIOGRAM COMPLETE    MYOCARDIAL PERFUSION IMAGING   Had no testing last few years advise echo, stress test    7. SOB (shortness of breath)  R06.02 PCV ECHOCARDIOGRAM COMPLETE    MYOCARDIAL PERFUSION IMAGING   Will check echo, stress test       Problem List Items Addressed This Visit       Cardiovascular and Mediastinum   Hypertension - Primary   Relevant Orders   PCV ECHOCARDIOGRAM COMPLETE   MYOCARDIAL PERFUSION IMAGING   Myocardial infarction (HCC)   Relevant Orders   PCV ECHOCARDIOGRAM COMPLETE   MYOCARDIAL PERFUSION IMAGING   Coronary atherosclerosis   Relevant Orders   PCV ECHOCARDIOGRAM COMPLETE    MYOCARDIAL PERFUSION IMAGING   Congestive heart failure (HCC)   Relevant Orders   PCV ECHOCARDIOGRAM COMPLETE   MYOCARDIAL PERFUSION IMAGING     Genitourinary   CKD stage 3b, GFR 30-44 ml/min (HCC)   Relevant Orders   PCV ECHOCARDIOGRAM COMPLETE   MYOCARDIAL PERFUSION IMAGING     Other   S/P drug eluting coronary stent placement   Relevant Orders   PCV ECHOCARDIOGRAM COMPLETE   MYOCARDIAL PERFUSION IMAGING   Other Visit Diagnoses     SOB (shortness of breath)       Will check echo, stress test   Relevant Orders   PCV ECHOCARDIOGRAM COMPLETE   MYOCARDIAL PERFUSION IMAGING          Disposition:   Return in about 2 weeks (around 06/18/2023) for echo, stress test and f/u.    Total time spent: 30 minutes  Signed,  Adrian Blackwater, MD  06/04/2023 9:34 AM    [facility]

## 2023-06-07 ENCOUNTER — Ambulatory Visit: Payer: 59 | Admitting: Cardiovascular Disease

## 2023-06-12 ENCOUNTER — Ambulatory Visit (INDEPENDENT_AMBULATORY_CARE_PROVIDER_SITE_OTHER): Payer: 59

## 2023-06-12 DIAGNOSIS — I2510 Atherosclerotic heart disease of native coronary artery without angina pectoris: Secondary | ICD-10-CM | POA: Diagnosis not present

## 2023-06-12 DIAGNOSIS — I509 Heart failure, unspecified: Secondary | ICD-10-CM

## 2023-06-12 DIAGNOSIS — I10 Essential (primary) hypertension: Secondary | ICD-10-CM

## 2023-06-12 DIAGNOSIS — R0602 Shortness of breath: Secondary | ICD-10-CM

## 2023-06-12 DIAGNOSIS — Z955 Presence of coronary angioplasty implant and graft: Secondary | ICD-10-CM

## 2023-06-12 DIAGNOSIS — N1832 Chronic kidney disease, stage 3b: Secondary | ICD-10-CM

## 2023-06-12 DIAGNOSIS — I219 Acute myocardial infarction, unspecified: Secondary | ICD-10-CM

## 2023-06-12 MED ORDER — TECHNETIUM TC 99M SESTAMIBI GENERIC - CARDIOLITE
34.5000 | Freq: Once | INTRAVENOUS | Status: AC | PRN
  Administered 2023-06-12: 34.5 via INTRAVENOUS

## 2023-06-12 MED ORDER — TECHNETIUM TC 99M SESTAMIBI GENERIC - CARDIOLITE
10.1000 | Freq: Once | INTRAVENOUS | Status: AC | PRN
  Administered 2023-06-12: 10.1 via INTRAVENOUS

## 2023-06-13 ENCOUNTER — Ambulatory Visit: Payer: 59

## 2023-06-13 DIAGNOSIS — I10 Essential (primary) hypertension: Secondary | ICD-10-CM

## 2023-06-13 DIAGNOSIS — Z955 Presence of coronary angioplasty implant and graft: Secondary | ICD-10-CM

## 2023-06-13 DIAGNOSIS — R0602 Shortness of breath: Secondary | ICD-10-CM

## 2023-06-13 DIAGNOSIS — I2510 Atherosclerotic heart disease of native coronary artery without angina pectoris: Secondary | ICD-10-CM

## 2023-06-13 DIAGNOSIS — I351 Nonrheumatic aortic (valve) insufficiency: Secondary | ICD-10-CM | POA: Diagnosis not present

## 2023-06-13 DIAGNOSIS — N1832 Chronic kidney disease, stage 3b: Secondary | ICD-10-CM

## 2023-06-13 DIAGNOSIS — I219 Acute myocardial infarction, unspecified: Secondary | ICD-10-CM

## 2023-06-13 DIAGNOSIS — I509 Heart failure, unspecified: Secondary | ICD-10-CM

## 2023-06-18 ENCOUNTER — Encounter: Payer: Self-pay | Admitting: Cardiovascular Disease

## 2023-06-18 ENCOUNTER — Ambulatory Visit (INDEPENDENT_AMBULATORY_CARE_PROVIDER_SITE_OTHER): Payer: 59 | Admitting: Cardiovascular Disease

## 2023-06-18 VITALS — BP 112/78 | HR 66 | Ht 67.0 in | Wt 274.0 lb

## 2023-06-18 DIAGNOSIS — I509 Heart failure, unspecified: Secondary | ICD-10-CM | POA: Diagnosis not present

## 2023-06-18 DIAGNOSIS — Z955 Presence of coronary angioplasty implant and graft: Secondary | ICD-10-CM

## 2023-06-18 DIAGNOSIS — E782 Mixed hyperlipidemia: Secondary | ICD-10-CM

## 2023-06-18 DIAGNOSIS — I2510 Atherosclerotic heart disease of native coronary artery without angina pectoris: Secondary | ICD-10-CM | POA: Diagnosis not present

## 2023-06-18 DIAGNOSIS — I10 Essential (primary) hypertension: Secondary | ICD-10-CM | POA: Diagnosis not present

## 2023-06-18 DIAGNOSIS — R1030 Lower abdominal pain, unspecified: Secondary | ICD-10-CM

## 2023-06-18 NOTE — Progress Notes (Addendum)
Cardiology Office Note   Date:  06/18/2023   ID:  DYER HUIZAR, DOB 1955-07-25, MRN [id number]  PCP:  Sherron Monday, MD  Cardiologist:  Adrian Blackwater, MD      History of Present Illness: Derrick Barajas is a 67 y.o. male who presents for No chief complaint on file.   Abdominal Pain This is a new problem. The current episode started in the past 7 days. The problem occurs 2 to 4 times per day. The problem has been rapidly worsening. The pain is located in the RLQ and LUQ. The pain is at a severity of 5/10. The quality of the pain is dull.      Past Medical History:  Diagnosis Date   CAD (coronary artery disease)    CHF (congestive heart failure) (HCC)    Diabetes mellitus without complication (HCC)    Gout    Hyperlipidemia    Hypertension    MI (myocardial infarction) (HCC)    RA (rheumatoid arthritis) (HCC)    Sleep apnea    Stroke (HCC)    left facal droop   Thyroid disease      Past Surgical History:  Procedure Laterality Date   CIRCUMCISION     CORONARY ANGIOPLASTY WITH STENT PLACEMENT     CORONARY STENT INTERVENTION N/A 11/17/2018   Procedure: CORONARY STENT INTERVENTION;  Surgeon: Alwyn Pea, MD;  Location: [facility];  Service: Cardiovascular;  Laterality: N/A;   LEFT HEART CATH AND CORONARY ANGIOGRAPHY Left 11/17/2018   Procedure: LEFT HEART CATH AND CORONARY ANGIOGRAPHY;  Surgeon: Laurier Nancy, MD;  Location: [facility];  Service: Cardiovascular;  Laterality: Left;   TONSILLECTOMY       Current Outpatient Medications  Medication Sig Dispense Refill   ACCU-CHEK GUIDE test strip USE WITH DEVICE TO CHECK SUGARS  3 TIMES DAILY 300 strip 2   amLODipine (NORVASC) 10 MG tablet TAKE 1 TABLET BY MOUTH DAILY 100 tablet 2   aspirin EC 81 MG tablet Take 81 mg by mouth daily.     B-D ULTRAFINE III SHORT PEN 31G X 8 MM MISC USE WITH PEN TWICE DAILY AS  DIRECTED 200 each 3   Blood Glucose Monitoring Suppl (ACCU-CHEK GUIDE) w/Device  KIT USE WITH SUPPLIES TO CHECK BLOOD SUGAR 3 TIMES DAILY (Patient not taking: Reported on 04/10/2023) 100 kit 3   Colchicine 0.6 MG CAPS Take 1 capsule by mouth daily.     docusate sodium (COLACE) 100 MG capsule Take 100 mg at bedtime by mouth.      ezetimibe (ZETIA) 10 MG tablet Take 1 tablet by mouth once daily 90 tablet 0   fluticasone (FLONASE) 50 MCG/ACT nasal spray Place 1 spray into both nostrils daily. (Patient not taking: Reported on 04/10/2023)  1   furosemide (LASIX) 20 MG tablet TAKE 1 TABLET BY MOUTH TWICE  DAILY 180 tablet 3   isosorbide mononitrate (IMDUR) 60 MG 24 hr tablet Take 1 tablet by mouth once daily 90 tablet 0   JARDIANCE 25 MG TABS tablet TAKE 1 TABLET BY MOUTH EVERY  MORNING 100 tablet 2   KERENDIA 10 MG TABS Take 1 tablet (10 mg total) by mouth daily. 90 tablet 0   levothyroxine (SYNTHROID) 150 MCG tablet TAKE 1 TABLET BY MOUTH DAILY  BEFORE BREAKFAST 100 tablet 2   lisinopril (ZESTRIL) 40 MG tablet TAKE 1 TABLET BY MOUTH DAILY 100 tablet 2   metoprolol (TOPROL-XL) 200 MG 24 hr tablet TAKE  1 TABLET BY MOUTH DAILY 90 tablet 3   MOUNJARO 7.5 MG/0.5ML Pen Inject into the skin.     nitroGLYCERIN (NITROSTAT) 0.4 MG SL tablet Place 0.4 mg under the tongue every 5 (five) minutes as needed for chest pain. (Patient not taking: Reported on 04/10/2023)     olopatadine (PATANOL) 0.1 % ophthalmic solution Place 1 drop into both eyes daily.     prasugrel (EFFIENT) 10 MG TABS tablet TAKE 1 TABLET BY MOUTH ONCE  DAILY 90 tablet 3   ranolazine (RANEXA) 500 MG 12 hr tablet TAKE 1 TABLET BY MOUTH TWICE  DAILY 160 tablet 3   rosuvastatin (CRESTOR) 20 MG tablet TAKE 1 TABLET BY MOUTH DAILY 100 tablet 2   TRESIBA FLEXTOUCH 200 UNIT/ML FlexTouch Pen ADMINISTER 130 UNITS  SUBCUTANEOUSLY TWICE DAILY AS  DIRECTED (Patient taking differently: 110 Units.) 108 mL 3   No current facility-administered medications for this visit.    Allergies:   Shellfish-derived products, Sulfa antibiotics, Iodine,  Atorvastatin, Iodinated contrast media, Nsaids, and Shrimp [shellfish allergy]    Social History:   reports that he has never smoked. He has never used smokeless tobacco. He reports current alcohol use of about 1.0 standard drink of alcohol per week. He reports that he does not use drugs.   Family History:  family history includes Diabetes in his brother and mother.    ROS:     Review of Systems  Constitutional: Negative.   HENT: Negative.    Eyes: Negative.   Respiratory: Negative.    Gastrointestinal:  Positive for abdominal pain.  Genitourinary: Negative.   Musculoskeletal: Negative.   Skin: Negative.   Neurological: Negative.   Endo/Heme/Allergies: Negative.   Psychiatric/Behavioral: Negative.    All other systems reviewed and are negative.     All other systems are reviewed and negative.    PHYSICAL EXAM: VS:  BP 112/78   Pulse 66   Ht 5\' 7"  (1.702 m)   Wt 274 lb (124.3 kg)   SpO2 98%   BMI 42.91 kg/m  , BMI Body mass index is 42.91 kg/m. Last weight:  Wt Readings from Last 3 Encounters:  06/18/23 274 lb (124.3 kg)  06/04/23 273 lb 3.2 oz (123.9 kg)  05/24/23 271 lb 6.4 oz (123.1 kg)     Physical Exam Vitals reviewed.  Constitutional:      Appearance: Normal appearance. He is normal weight.  HENT:     Head: Normocephalic.     Nose: Nose normal.     Mouth/Throat:     Mouth: Mucous membranes are moist.  Eyes:     Pupils: Pupils are equal, round, and reactive to light.  Cardiovascular:     Rate and Rhythm: Normal rate and regular rhythm.     Pulses: Normal pulses.     Heart sounds: Normal heart sounds.  Pulmonary:     Effort: Pulmonary effort is normal.  Abdominal:     General: Abdomen is flat. Bowel sounds are normal.  Musculoskeletal:        General: Normal range of motion.     Cervical back: Normal range of motion.  Skin:    General: Skin is warm.  Neurological:     General: No focal deficit present.     Mental Status: He is alert.   Psychiatric:        Mood and Affect: Mood normal.       EKG:   Recent Labs: 04/05/2023: ALT 14; TSH 0.779 05/24/2023: BUN 23; Creatinine, Ser  1.64; Potassium 5.2; Sodium 142    Lipid Panel    Component Value Date/Time   CHOL 122 04/05/2023 1352   CHOL 139 08/29/2011 0351   TRIG 127 04/05/2023 1352   TRIG 185 08/29/2011 0351   HDL 41 04/05/2023 1352   HDL 38 (L) 08/29/2011 0351   CHOLHDL 3.0 04/05/2023 1352   CHOLHDL 2.6 11/18/2018 0352   VLDL 19 11/18/2018 0352   VLDL 37 08/29/2011 0351   LDLCALC 58 04/05/2023 1352   LDLCALC 64 08/29/2011 0351      Other studies Reviewed: Additional studies/ records that were reviewed today include:  Review of the above records demonstrates:       No data to display            ASSESSMENT AND PLAN:    ICD-10-CM   1. Hypertension, unspecified type  I10 CT ABDOMEN PELVIS WO CONTRAST    2. Atherosclerosis of native coronary artery of native heart without angina pectoris  I25.10 CT ABDOMEN PELVIS WO CONTRAST    3. Chronic congestive heart failure, unspecified heart failure type (HCC)  I50.9 CT ABDOMEN PELVIS WO CONTRAST    4. Hyperlipidemia, mixed  E78.2 CT ABDOMEN PELVIS WO CONTRAST    5. S/P drug eluting coronary stent placement  Z95.5 CT ABDOMEN PELVIS WO CONTRAST   Has ischaemia in LCX territory, advise medical treatme t as no further chest pain. Crar 1.64, if chest pain do cath    6. Lower abdominal pain  R10.30 CT ABDOMEN PELVIS WO CONTRAST       Problem List Items Addressed This Visit       Cardiovascular and Mediastinum   Hypertension - Primary   Relevant Orders   CT ABDOMEN PELVIS WO CONTRAST   Coronary atherosclerosis   Relevant Orders   CT ABDOMEN PELVIS WO CONTRAST   Congestive heart failure (HCC)   Relevant Orders   CT ABDOMEN PELVIS WO CONTRAST     Other   S/P drug eluting coronary stent placement   Relevant Orders   CT ABDOMEN PELVIS WO CONTRAST   Hyperlipidemia, mixed   Relevant Orders   CT  ABDOMEN PELVIS WO CONTRAST   Other Visit Diagnoses     Lower abdominal pain       Relevant Orders   CT ABDOMEN PELVIS WO CONTRAST          Disposition:   Return in about 4 weeks (around 07/16/2023) for ct abdomen and f/u.    Total time spent: 30 minutes  Signed,  Adrian Blackwater, MD  06/18/2023 9:43 AM    [facility]

## 2023-06-21 ENCOUNTER — Other Ambulatory Visit: Payer: Self-pay | Admitting: Cardiovascular Disease

## 2023-06-25 ENCOUNTER — Other Ambulatory Visit: Payer: Self-pay | Admitting: Internal Medicine

## 2023-06-25 ENCOUNTER — Ambulatory Visit (INDEPENDENT_AMBULATORY_CARE_PROVIDER_SITE_OTHER): Payer: 59

## 2023-06-25 DIAGNOSIS — R1030 Lower abdominal pain, unspecified: Secondary | ICD-10-CM | POA: Diagnosis not present

## 2023-06-25 DIAGNOSIS — I2510 Atherosclerotic heart disease of native coronary artery without angina pectoris: Secondary | ICD-10-CM

## 2023-06-25 DIAGNOSIS — Z955 Presence of coronary angioplasty implant and graft: Secondary | ICD-10-CM

## 2023-06-25 DIAGNOSIS — E782 Mixed hyperlipidemia: Secondary | ICD-10-CM

## 2023-06-25 DIAGNOSIS — I10 Essential (primary) hypertension: Secondary | ICD-10-CM

## 2023-06-25 DIAGNOSIS — I509 Heart failure, unspecified: Secondary | ICD-10-CM

## 2023-06-26 ENCOUNTER — Other Ambulatory Visit: Payer: Self-pay | Admitting: Internal Medicine

## 2023-06-28 ENCOUNTER — Other Ambulatory Visit: Payer: Self-pay | Admitting: Cardiovascular Disease

## 2023-06-28 ENCOUNTER — Other Ambulatory Visit: Payer: Self-pay | Admitting: Internal Medicine

## 2023-07-12 ENCOUNTER — Other Ambulatory Visit: Payer: Self-pay | Admitting: Internal Medicine

## 2023-07-16 ENCOUNTER — Encounter: Payer: Self-pay | Admitting: Cardiovascular Disease

## 2023-07-16 ENCOUNTER — Ambulatory Visit: Payer: 59 | Admitting: Cardiovascular Disease

## 2023-07-16 VITALS — BP 127/83 | HR 70 | Ht 67.0 in | Wt 275.6 lb

## 2023-07-16 DIAGNOSIS — I509 Heart failure, unspecified: Secondary | ICD-10-CM

## 2023-07-16 DIAGNOSIS — I10 Essential (primary) hypertension: Secondary | ICD-10-CM | POA: Diagnosis not present

## 2023-07-16 DIAGNOSIS — Z955 Presence of coronary angioplasty implant and graft: Secondary | ICD-10-CM | POA: Diagnosis not present

## 2023-07-16 DIAGNOSIS — I2510 Atherosclerotic heart disease of native coronary artery without angina pectoris: Secondary | ICD-10-CM

## 2023-07-16 NOTE — Progress Notes (Addendum)
 Cardiology Office Note   Date:  07/16/2023   ID:  Derrick Barajas, DOB 1955-11-21, MRN [id number]  PCP:  Albina GORMAN Dine, MD  Cardiologist:  Denyse Bathe, MD      History of Present Illness: Derrick Barajas is a 68 y.o. male who presents for  Chief Complaint  Patient presents with   Follow-up    4 week follow up    No complaints      Past Medical History:  Diagnosis Date   CAD (coronary artery disease)    CHF (congestive heart failure) (HCC)    Diabetes mellitus without complication (HCC)    Gout    Hyperlipidemia    Hypertension    MI (myocardial infarction) (HCC)    RA (rheumatoid arthritis) (HCC)    Sleep apnea    Stroke (HCC)    left facal droop   Thyroid  disease      Past Surgical History:  Procedure Laterality Date   CIRCUMCISION     CORONARY ANGIOPLASTY WITH STENT PLACEMENT     CORONARY STENT INTERVENTION N/A 11/17/2018   Procedure: CORONARY STENT INTERVENTION;  Surgeon: Florencio Cara BIRCH, MD;  Location: [facility];  Service: Cardiovascular;  Laterality: N/A;   LEFT HEART CATH AND CORONARY ANGIOGRAPHY Left 11/17/2018   Procedure: LEFT HEART CATH AND CORONARY ANGIOGRAPHY;  Surgeon: Bathe Denyse LABOR, MD;  Location: [facility];  Service: Cardiovascular;  Laterality: Left;   TONSILLECTOMY       Current Outpatient Medications  Medication Sig Dispense Refill   ACCU-CHEK GUIDE test strip USE WITH DEVICE TO CHECK SUGARS  3 TIMES DAILY 300 strip 2   amLODipine  (NORVASC ) 10 MG tablet TAKE 1 TABLET BY MOUTH DAILY 100 tablet 2   aspirin  EC 81 MG tablet Take 81 mg by mouth daily.     B-D ULTRAFINE III SHORT PEN 31G X 8 MM MISC USE WITH PEN TWICE DAILY AS  DIRECTED 200 each 3   Blood Glucose Monitoring Suppl (ACCU-CHEK GUIDE) w/Device KIT USE WITH SUPPLIES TO CHECK BLOOD SUGAR 3 TIMES DAILY (Patient not taking: Reported on 04/10/2023) 100 kit 3   Colchicine  0.6 MG CAPS Take 1 capsule by mouth daily.     docusate sodium  (COLACE) 100 MG capsule  Take 100 mg at bedtime by mouth.      ezetimibe  (ZETIA ) 10 MG tablet Take 1 tablet by mouth once daily 90 tablet 0   fluticasone  (FLONASE ) 50 MCG/ACT nasal spray Place 1 spray into both nostrils daily. (Patient not taking: Reported on 04/10/2023)  1   furosemide  (LASIX ) 20 MG tablet TAKE 1 TABLET BY MOUTH TWICE  DAILY 180 tablet 3   isosorbide  mononitrate (IMDUR ) 60 MG 24 hr tablet Take 1 tablet by mouth once daily 90 tablet 0   JARDIANCE  25 MG TABS tablet TAKE 1 TABLET BY MOUTH EVERY  MORNING 100 tablet 2   KERENDIA  10 MG TABS Take 1 tablet by mouth once daily 30 tablet 0   levothyroxine  (SYNTHROID ) 150 MCG tablet TAKE 1 TABLET BY MOUTH DAILY  BEFORE BREAKFAST 100 tablet 2   lisinopril  (ZESTRIL ) 40 MG tablet TAKE 1 TABLET BY MOUTH DAILY 100 tablet 2   metoprolol  (TOPROL -XL) 200 MG 24 hr tablet TAKE 1 TABLET BY MOUTH DAILY 90 tablet 3   MOUNJARO 7.5 MG/0.5ML Pen Inject into the skin.     nitroGLYCERIN  (NITROSTAT ) 0.4 MG SL tablet Place 0.4 mg under the tongue every 5 (five) minutes as needed for chest  pain. (Patient not taking: Reported on 04/10/2023)     olopatadine  (PATANOL) 0.1 % ophthalmic solution Place 1 drop into both eyes daily.     prasugrel  (EFFIENT ) 10 MG TABS tablet TAKE 1 TABLET BY MOUTH ONCE  DAILY 90 tablet 3   ranolazine  (RANEXA ) 500 MG 12 hr tablet TAKE 1 TABLET BY MOUTH TWICE  DAILY 160 tablet 3   rosuvastatin  (CRESTOR ) 20 MG tablet TAKE 1 TABLET BY MOUTH DAILY 100 tablet 2   TRESIBA  FLEXTOUCH 200 UNIT/ML FlexTouch Pen ADMINISTER 130 UNITS  SUBCUTANEOUSLY TWICE DAILY AS  DIRECTED (Patient taking differently: 110 Units.) 108 mL 3   No current facility-administered medications for this visit.    Allergies:   Shellfish-derived products, Sulfa antibiotics, Iodine, Atorvastatin, Iodinated contrast media, Nsaids, and Shrimp [shellfish allergy]    Social History:   reports that he has never smoked. He has never used smokeless tobacco. He reports current alcohol use of about 1.0  standard drink of alcohol per week. He reports that he does not use drugs.   Family History:  family history includes Diabetes in his brother and mother.    ROS:     Review of Systems  Constitutional: Negative.   HENT: Negative.    Eyes: Negative.   Respiratory: Negative.    Gastrointestinal: Negative.   Genitourinary: Negative.   Musculoskeletal: Negative.   Skin: Negative.   Neurological: Negative.   Endo/Heme/Allergies: Negative.   Psychiatric/Behavioral: Negative.    All other systems reviewed and are negative.     All other systems are reviewed and negative.    PHYSICAL EXAM: VS:  BP 127/83   Pulse 70   Ht 5' 7 (1.702 m)   Wt 275 lb 9.6 oz (125 kg)   SpO2 97%   BMI 43.17 kg/m  , BMI Body mass index is 43.17 kg/m. Last weight:  Wt Readings from Last 3 Encounters:  07/16/23 275 lb 9.6 oz (125 kg)  06/18/23 274 lb (124.3 kg)  06/04/23 273 lb 3.2 oz (123.9 kg)     Physical Exam Vitals reviewed.  Constitutional:      Appearance: Normal appearance. He is normal weight.  HENT:     Head: Normocephalic.     Nose: Nose normal.     Mouth/Throat:     Mouth: Mucous membranes are moist.  Eyes:     Pupils: Pupils are equal, round, and reactive to light.  Cardiovascular:     Rate and Rhythm: Normal rate and regular rhythm.     Pulses: Normal pulses.     Heart sounds: Normal heart sounds.  Pulmonary:     Effort: Pulmonary effort is normal.  Abdominal:     General: Abdomen is flat. Bowel sounds are normal.  Musculoskeletal:        General: Normal range of motion.     Cervical back: Normal range of motion.  Skin:    General: Skin is warm.  Neurological:     General: No focal deficit present.     Mental Status: He is alert.  Psychiatric:        Mood and Affect: Mood normal.       EKG:   Recent Labs: 04/05/2023: ALT 14; TSH 0.779 05/24/2023: BUN 23; Creatinine, Ser 1.64; Potassium 5.2; Sodium 142    Lipid Panel    Component Value Date/Time   CHOL  122 04/05/2023 1352   CHOL 139 08/29/2011 0351   TRIG 127 04/05/2023 1352   TRIG 185 08/29/2011 0351   HDL  41 04/05/2023 1352   HDL 38 (L) 08/29/2011 0351   CHOLHDL 3.0 04/05/2023 1352   CHOLHDL 2.6 11/18/2018 0352   VLDL 19 11/18/2018 0352   VLDL 37 08/29/2011 0351   LDLCALC 58 04/05/2023 1352   LDLCALC 64 08/29/2011 0351      Other studies Reviewed: Additional studies/ records that were reviewed today include:  Review of the above records demonstrates:       No data to display            ASSESSMENT AND PLAN:    ICD-10-CM   1. Hypertension, unspecified type  I10    LVEF 54%. CT abdomen fine    2. Atherosclerosis of native coronary artery of native heart without angina pectoris  I25.10     3. Chronic congestive heart failure, unspecified heart failure type (HCC)  I50.9     4. S/P drug eluting coronary stent placement  Z95.5    had ischaemia on stress test but had creat 1.64 thus cannot do CCTa. Will do cath if chest pain       Problem List Items Addressed This Visit       Cardiovascular and Mediastinum   Hypertension - Primary   Coronary atherosclerosis   Congestive heart failure (HCC)     Other   S/P drug eluting coronary stent placement       Disposition:   Return in about 3 months (around 10/14/2023).    Total time spent: 30 minutes  Signed,  Denyse Bathe, MD  07/16/2023 9:35 AM    [facility]

## 2023-07-18 DIAGNOSIS — E1169 Type 2 diabetes mellitus with other specified complication: Secondary | ICD-10-CM | POA: Diagnosis not present

## 2023-07-18 DIAGNOSIS — E1142 Type 2 diabetes mellitus with diabetic polyneuropathy: Secondary | ICD-10-CM | POA: Diagnosis not present

## 2023-07-18 DIAGNOSIS — E1159 Type 2 diabetes mellitus with other circulatory complications: Secondary | ICD-10-CM | POA: Diagnosis not present

## 2023-07-18 DIAGNOSIS — N1832 Chronic kidney disease, stage 3b: Secondary | ICD-10-CM | POA: Diagnosis not present

## 2023-07-18 DIAGNOSIS — E1122 Type 2 diabetes mellitus with diabetic chronic kidney disease: Secondary | ICD-10-CM | POA: Diagnosis not present

## 2023-07-18 DIAGNOSIS — Z794 Long term (current) use of insulin: Secondary | ICD-10-CM | POA: Diagnosis not present

## 2023-07-26 ENCOUNTER — Encounter: Payer: Self-pay | Admitting: Internal Medicine

## 2023-07-26 ENCOUNTER — Ambulatory Visit (INDEPENDENT_AMBULATORY_CARE_PROVIDER_SITE_OTHER): Payer: 59 | Admitting: Internal Medicine

## 2023-07-26 VITALS — BP 120/80 | HR 85 | Temp 97.3°F | Ht 67.0 in | Wt 271.6 lb

## 2023-07-26 DIAGNOSIS — Z794 Long term (current) use of insulin: Secondary | ICD-10-CM

## 2023-07-26 DIAGNOSIS — N1832 Chronic kidney disease, stage 3b: Secondary | ICD-10-CM | POA: Diagnosis not present

## 2023-07-26 DIAGNOSIS — N401 Enlarged prostate with lower urinary tract symptoms: Secondary | ICD-10-CM

## 2023-07-26 DIAGNOSIS — R351 Nocturia: Secondary | ICD-10-CM

## 2023-07-26 DIAGNOSIS — E119 Type 2 diabetes mellitus without complications: Secondary | ICD-10-CM | POA: Diagnosis not present

## 2023-07-26 DIAGNOSIS — Z0130 Encounter for examination of blood pressure without abnormal findings: Secondary | ICD-10-CM

## 2023-07-26 LAB — GLUCOSE, POCT (MANUAL RESULT ENTRY): POC Glucose: 111 mg/dL — AB (ref 70–99)

## 2023-07-26 NOTE — Progress Notes (Signed)
Established Patient Office Visit  Subjective:  Patient ID: Derrick Barajas, male    DOB: 11/22/1955  Age: 68 y.o. MRN: [id number]  Chief Complaint  Patient presents with   Follow-up    2 month    No new complaints, here for lab review and medication refills. Labs unavailable for review. Denies any hypoglycemic episodes and home bg readings have been at target.       No other concerns at this time.   Past Medical History:  Diagnosis Date   CAD (coronary artery disease)    CHF (congestive heart failure) (HCC)    Diabetes mellitus without complication (HCC)    Gout    Hyperlipidemia    Hypertension    MI (myocardial infarction) (HCC)    RA (rheumatoid arthritis) (HCC)    Sleep apnea    Stroke (HCC)    left facal droop   Thyroid disease     Past Surgical History:  Procedure Laterality Date   CIRCUMCISION     CORONARY ANGIOPLASTY WITH STENT PLACEMENT     CORONARY STENT INTERVENTION N/A 11/17/2018   Procedure: CORONARY STENT INTERVENTION;  Surgeon: Alwyn Pea, MD;  Location: [facility] INV[facility]IVE CV [facility];  Service: Cardiovascular;  Laterality: N/A;   LEFT HEART CATH AND CORONARY ANGIOGRAPHY Left 11/17/2018   Procedure: LEFT HEART CATH AND CORONARY ANGIOGRAPHY;  Surgeon: Laurier Nancy, MD;  Location: [facility] INV[facility]IVE CV [facility];  Service: Cardiovascular;  Laterality: Left;   TONSILLECTOMY      Social History   Socioeconomic History   Marital status: Legally Separated    Spouse name: Not on file   Number of children: Not on file   Years of education: Not on file   Highest education level: Not on file  Occupational History   Not on file  Tobacco Use   Smoking status: Never   Smokeless tobacco: Never  Vaping Use   Vaping status: Never Used  Substance and Sexual Activity   Alcohol use: Yes    Alcohol/week: 1.0 standard drink of alcohol    Types: 1 Cans of beer per week    Comment: once a week   Drug use: No   Sexual activity: Yes    Partners: Female    Birth  control/protection: None  Other Topics Concern   Not on file  Social History Narrative   Not on file   Social Drivers of Health   Financial Resource Strain: Low Risk  (02/06/2022)   Overall Financial Resource Strain (CARDIA)    Difficulty of Paying Living Expenses: Not hard at all  Food Insecurity: No Food Insecurity (03/15/2022)   Hunger Vital Sign    Worried About Running Out of Food in the Last Year: Never true    Ran Out of Food in the Last Year: Never true  Transportation Needs: Unmet Transportation Needs (03/15/2022)   PRAPARE - Administrator, Civil Service (Medical): Yes    Lack of Transportation (Non-Medical): Yes  Physical Activity: Not on file  Stress: Not on file  Social Connections: Not on file  Intimate Partner Violence: Not At Risk (03/07/2022)   Humiliation, Afraid, Rape, and Kick questionnaire    Fear of Current or Ex-Partner: No    Emotionally Abused: No    Physically Abused: No    Sexually Abused: No    Family History  Problem Relation Age of Onset   Diabetes Mother    Diabetes Brother     Allergies  Allergen Reactions  Shellfish-Derived Products Other (See Comments)    Other reaction(Audrinna Sherman): Nausea/Vomiting/Diarrhea, Shock/Unconsciousness   Sulfa Antibiotics Other (See Comments)    Other reaction(Kariyah Baugh): Nausea/Vomiting/Diarrhea, Shock/Unconsciousness   Iodine Nausea And Vomiting   Atorvastatin Other (See Comments)   Iodinated Contrast Media Other (See Comments)   Nsaids Other (See Comments)    D/t cardiac meds   Shrimp [Shellfish Allergy] Nausea And Vomiting    Outpatient Medications Prior to Visit  Medication Sig   ACCU-CHEK GUIDE test strip USE WITH DEVICE TO CHECK SUGARS  3 TIMES DAILY   amLODipine (NORVASC) 10 MG tablet TAKE 1 TABLET BY MOUTH DAILY   aspirin EC 81 MG tablet Take 81 mg by mouth daily.   B-D ULTRAFINE III SHORT PEN 31G X 8 MM MISC USE WITH PEN TWICE DAILY AS  DIRECTED   Colchicine 0.6 MG CAPS Take 1 capsule by mouth daily.    docusate sodium (COLACE) 100 MG capsule Take 100 mg at bedtime by mouth.    ezetimibe (ZETIA) 10 MG tablet Take 1 tablet by mouth once daily   furosemide (LASIX) 20 MG tablet TAKE 1 TABLET BY MOUTH TWICE  DAILY   isosorbide mononitrate (IMDUR) 60 MG 24 hr tablet Take 1 tablet by mouth once daily   JARDIANCE 25 MG TABS tablet TAKE 1 TABLET BY MOUTH EVERY  MORNING   KERENDIA 10 MG TABS Take 1 tablet by mouth once daily   levothyroxine (SYNTHROID) 150 MCG tablet TAKE 1 TABLET BY MOUTH DAILY  BEFORE BREAKFAST   lisinopril (ZESTRIL) 40 MG tablet TAKE 1 TABLET BY MOUTH DAILY   metoprolol (TOPROL-XL) 200 MG 24 hr tablet TAKE 1 TABLET BY MOUTH DAILY   olopatadine (PATANOL) 0.1 % ophthalmic solution Place 1 drop into both eyes daily.   prasugrel (EFFIENT) 10 MG TABS tablet TAKE 1 TABLET BY MOUTH ONCE  DAILY   ranolazine (RANEXA) 500 MG 12 hr tablet TAKE 1 TABLET BY MOUTH TWICE  DAILY   rosuvastatin (CRESTOR) 20 MG tablet TAKE 1 TABLET BY MOUTH DAILY   TRESIBA FLEXTOUCH 200 UNIT/ML FlexTouch Pen ADMINISTER 130 UNITS  SUBCUTANEOUSLY TWICE DAILY AS  DIRECTED (Patient taking differently: 110 Units.)   [DISCONTINUED] MOUNJARO 7.5 MG/0.5ML Pen Inject into the skin.   Blood Glucose Monitoring Suppl (ACCU-CHEK GUIDE) w/Device KIT USE WITH SUPPLIES TO CHECK BLOOD SUGAR 3 TIMES DAILY (Patient not taking: Reported on 07/26/2023)   fluticasone (FLONASE) 50 MCG/ACT nasal spray Place 1 spray into both nostrils daily. (Patient not taking: Reported on 07/26/2023)   nitroGLYCERIN (NITROSTAT) 0.4 MG SL tablet Place 0.4 mg under the tongue every 5 (five) minutes as needed for chest pain. (Patient not taking: Reported on 07/26/2023)   No facility-administered medications prior to visit.    Review of Systems  Constitutional:  Negative for weight loss.  HENT: Negative.    Respiratory: Negative.    Cardiovascular: Negative.   Gastrointestinal: Negative.   Skin: Negative.   Neurological:  Negative for dizziness.  All  other systems reviewed and are negative.      Objective:   BP 120/80   Pulse 85   Temp (!) 97.3 F (36.3 C)   Ht 5\' 7"  (1.702 m)   Wt 271 lb 9.6 oz (123.2 kg)   SpO2 93%   BMI 42.54 kg/m   Vitals:   07/26/23 1401  BP: 120/80  Pulse: 85  Temp: (!) 97.3 F (36.3 C)  Height: 5\' 7"  (1.702 m)  Weight: 271 lb 9.6 oz (123.2 kg)  SpO2: 93%  BMI (Calculated): 42.53    Physical Exam Vitals reviewed.  Constitutional:      Appearance: Normal appearance. He is obese.  HENT:     Head: Normocephalic.     Left Ear: There is no impacted cerumen.     Nose: Nose normal.     Mouth/Throat:     Mouth: Mucous membranes are moist.     Pharynx: No posterior oropharyngeal erythema.  Eyes:     Extraocular Movements: Extraocular movements intact.     Pupils: Pupils are equal, round, and reactive to light.  Cardiovascular:     Rate and Rhythm: Regular rhythm.     Chest Wall: PMI is not displaced.     Pulses: Normal pulses.     Heart sounds: Normal heart sounds. No murmur heard. Pulmonary:     Effort: Pulmonary effort is normal.     Breath sounds: Normal air entry. No rhonchi or rales.  Abdominal:     General: Abdomen is flat. Bowel sounds are normal. There is no distension.     Palpations: Abdomen is soft. There is no hepatomegaly, splenomegaly or mass.     Tenderness: There is no abdominal tenderness.  Musculoskeletal:        General: Normal range of motion.     Cervical back: Normal range of motion and neck supple.     Right lower leg: No edema.     Left lower leg: No edema.     Comments: Right ankle boot in situ  Skin:    General: Skin is warm and dry.  Neurological:     General: No focal deficit present.     Mental Status: He is alert and oriented to person, place, and time.     Cranial Nerves: No cranial nerve deficit.     Motor: No weakness.  Psychiatric:        Mood and Affect: Mood normal.        Behavior: Behavior normal.      Results for orders placed or  performed in visit on 07/26/23  POCT Glucose (CBG)  Result Value Ref Range   POC Glucose 111 (A) 70 - 99 mg/dl    Recent Results (from the past 2160 hours)  POCT CBG (Fasting - Glucose)     Status: Abnormal   Collection Time: 05/24/23  2:20 PM  Result Value Ref Range   Glucose Fasting, POC 68 (A) 70 - 99 mg/dL  [id number]+[id number] Gap     Status: Abnormal   Collection Time: 05/24/23  2:52 PM  Result Value Ref Range   Glucose 60 (L) 70 - 99 mg/dL   BUN 23 8 - 27 mg/dL   Creatinine, Ser 1.91 (H) 0.76 - 1.27 mg/dL   eGFR 46 (L) >47 WG/NFA/2.13   BUN/Creatinine Ratio 14 10 - 24   Sodium 142 134 - 144 mmol/L   Potassium 5.2 3.5 - 5.2 mmol/L   Chloride 103 96 - 106 mmol/L   CO2 25 20 - 29 mmol/L   Anion Gap 14.0 10.0 - 18.0 mmol/L   Calcium 9.5 8.6 - 10.2 mg/dL  Fructosamine     Status: None   Collection Time: 05/24/23  2:54 PM  Result Value Ref Range   Fructosamine 242 0 - 285 umol/L    Comment: Published reference interval for apparently healthy subjects between age 8 and 64 is 43 - 285 umol/L and in a poorly controlled diabetic population is 228 - 563 umol/L with a mean of 396 umol/L.  POCT Glucose (CBG)     Status: Abnormal   Collection Time: 07/26/23  2:07 PM  Result Value Ref Range   POC Glucose 111 (A) 70 - 99 mg/dl      Assessment & Plan:  As per problem list. Increase Kerendia if potassium remains normal. Problem List Items Addressed This Visit       Endocrine   Diabetes mellitus type 2, uncomplicated (HCC) - Primary   Relevant Orders   POCT Glucose (CBG) (Completed)   CBC With Diff/Platelet     Genitourinary   CKD stage 3b, GFR 30-44 ml/min (HCC)   Other Visit Diagnoses       Benign prostatic hyperplasia with nocturia       Relevant Orders   PSA       Return in about 3 months (around 10/24/2023) for awv with labs prior.   Total time spent: 20 minutes  Luna Fuse, MD  07/26/2023   This document may have been prepared by [facility] Voice  Recognition software and as such may include unintentional dictation errors.

## 2023-07-27 LAB — BMP8+ANION GAP
Anion Gap: 15 mmol/L (ref 10.0–18.0)
BUN/Creatinine Ratio: 12 (ref 10–24)
BUN: 18 mg/dL (ref 8–27)
CO2: 24 mmol/L (ref 20–29)
Calcium: 9 mg/dL (ref 8.6–10.2)
Chloride: 104 mmol/L (ref 96–106)
Creatinine, Ser: 1.46 mg/dL — ABNORMAL HIGH (ref 0.76–1.27)
Glucose: 91 mg/dL (ref 70–99)
Potassium: 4.6 mmol/L (ref 3.5–5.2)
Sodium: 143 mmol/L (ref 134–144)
eGFR: 52 mL/min/{1.73_m2} — ABNORMAL LOW (ref >59)

## 2023-07-28 ENCOUNTER — Other Ambulatory Visit: Payer: Self-pay | Admitting: Internal Medicine

## 2023-07-30 ENCOUNTER — Other Ambulatory Visit: Payer: Self-pay | Admitting: Internal Medicine

## 2023-07-30 DIAGNOSIS — N1832 Chronic kidney disease, stage 3b: Secondary | ICD-10-CM

## 2023-07-30 MED ORDER — KERENDIA 20 MG PO TABS: 1.0000 | ORAL_TABLET | Freq: Every day | ORAL | 0 refills | Status: DC

## 2023-08-01 ENCOUNTER — Other Ambulatory Visit: Payer: Self-pay

## 2023-08-01 NOTE — Progress Notes (Signed)
Patient informed. 

## 2023-08-04 ENCOUNTER — Other Ambulatory Visit: Payer: Self-pay | Admitting: Internal Medicine

## 2023-08-05 ENCOUNTER — Telehealth: Payer: Self-pay

## 2023-08-05 ENCOUNTER — Other Ambulatory Visit: Payer: Self-pay

## 2023-08-05 NOTE — Telephone Encounter (Signed)
Patient called stating very hard to understand patient has no voice hardly, he's feverish, runny nose, congestion and body aches, do you want him to make an appt or just call him in something, he's got [facility] Medicare so we are unable to have him come in for a COVID test.

## 2023-08-06 ENCOUNTER — Ambulatory Visit (INDEPENDENT_AMBULATORY_CARE_PROVIDER_SITE_OTHER): Payer: 59 | Admitting: Internal Medicine

## 2023-08-06 VITALS — BP 118/72 | HR 65 | Temp 96.7°F | Ht 67.0 in | Wt 270.0 lb

## 2023-08-06 DIAGNOSIS — J040 Acute laryngitis: Secondary | ICD-10-CM

## 2023-08-06 DIAGNOSIS — E119 Type 2 diabetes mellitus without complications: Secondary | ICD-10-CM

## 2023-08-06 DIAGNOSIS — Z0130 Encounter for examination of blood pressure without abnormal findings: Secondary | ICD-10-CM

## 2023-08-06 LAB — POCT CBG (FASTING - GLUCOSE)-MANUAL ENTRY: Glucose Fasting, POC: 122 mg/dL — AB (ref 70–99)

## 2023-08-06 LAB — POCT RAPID INFLUENZA A&B
FLU A: NEGATIVE
FLU B: NEGATIVE

## 2023-08-06 LAB — POCT RAPID STREP A (OFFICE): Rapid Strep A Screen: NEGATIVE

## 2023-08-06 NOTE — Progress Notes (Unsigned)
Established Patient Office Visit  Subjective:  Patient ID: Derrick Barajas, male    DOB: 1956/03/30  Age: 68 y.o. MRN: [id number]  No chief complaint on file.   SUBJECTIVE:  Derrick Barajas is a 68 y.o. male who complains of congestion, sore throat, nasal blockage, productive cough, and cough described as productive of green sputum for 3 days. He denies a history of chest pain, shortness of breath, and wheezing and has a history of asthma. Patient denies smoking cigarettes.    ASSESSMENT:  viral upper respiratory illness  PLAN: Symptomatic therapy suggested: push fluids, rest, and return office visit prn if symptoms persist or worsen. Lack of antibiotic effectiveness discussed with him. Call or return to clinic prn if these symptoms worsen or fail to improve as anticipated.    No other concerns at this time.   Past Medical History:  Diagnosis Date  . CAD (coronary artery disease)   . CHF (congestive heart failure) (HCC)   . Diabetes mellitus without complication (HCC)   . Gout   . Hyperlipidemia   . Hypertension   . MI (myocardial infarction) (HCC)   . RA (rheumatoid arthritis) (HCC)   . Sleep apnea   . Stroke [facility])    left facal droop  . Thyroid disease     Past Surgical History:  Procedure Laterality Date  . CIRCUMCISION    . CORONARY ANGIOPLASTY WITH STENT PLACEMENT    . CORONARY STENT INTERVENTION N/A 11/17/2018   Procedure: CORONARY STENT INTERVENTION;  Surgeon: Alwyn Pea, MD;  Location: [facility] INVASIVE CV LAB;  Service: Cardiovascular;  Laterality: N/A;  . LEFT HEART CATH AND CORONARY ANGIOGRAPHY Left 11/17/2018   Procedure: LEFT HEART CATH AND CORONARY ANGIOGRAPHY;  Surgeon: Laurier Nancy, MD;  Location: [facility] INVASIVE CV LAB;  Service: Cardiovascular;  Laterality: Left;  . TONSILLECTOMY      Social History   Socioeconomic History  . Marital status: Legally Separated    Spouse name: Not on file  . Number of children: Not on file  . Years of education:  Not on file  . Highest education level: Not on file  Occupational History  . Not on file  Tobacco Use  . Smoking status: Never  . Smokeless tobacco: Never  Vaping Use  . Vaping status: Never Used  Substance and Sexual Activity  . Alcohol use: Yes    Alcohol/week: 1.0 standard drink of alcohol    Types: 1 Cans of beer per week    Comment: once a week  . Drug use: No  . Sexual activity: Yes    Partners: Female    Birth control/protection: None  Other Topics Concern  . Not on file  Social History Narrative  . Not on file   Social Drivers of Health   Financial Resource Strain: Low Risk  (02/06/2022)   Overall Financial Resource Strain (CARDIA)   . Difficulty of Paying Living Expenses: Not hard at all  Food Insecurity: No Food Insecurity (03/15/2022)   Hunger Vital Sign   . Worried About Programme researcher, broadcasting/film/video in the Last Year: Never true   . Ran Out of Food in the Last Year: Never true  Transportation Needs: Unmet Transportation Needs (03/15/2022)   PRAPARE - Transportation   . Lack of Transportation (Medical): Yes   . Lack of Transportation (Non-Medical): Yes  Physical Activity: Not on file  Stress: Not on file  Social Connections: Not on file  Intimate Partner Violence: Not At Risk (03/07/2022)  Humiliation, Afraid, Rape, and Kick questionnaire   . Fear of Current or Ex-Partner: No   . Emotionally Abused: No   . Physically Abused: No   . Sexually Abused: No    Family History  Problem Relation Age of Onset  . Diabetes Mother   . Diabetes Brother     Allergies  Allergen Reactions  . Shellfish-Derived Products Other (See Comments)    Other reaction(Ciro Tashiro): Nausea/Vomiting/Diarrhea, Shock/Unconsciousness  . Sulfa Antibiotics Other (See Comments)    Other reaction(Damyra Luscher): Nausea/Vomiting/Diarrhea, Shock/Unconsciousness  . Iodine Nausea And Vomiting  . Atorvastatin Other (See Comments)  . Iodinated Contrast Media Other (See Comments)  . Nsaids Other (See Comments)    D/t cardiac  meds  . Shrimp [Shellfish Allergy] Nausea And Vomiting    Outpatient Medications Prior to Visit  Medication Sig  . ACCU-CHEK GUIDE test strip USE WITH DEVICE TO CHECK SUGARS  3 TIMES DAILY  . amLODipine (NORVASC) 10 MG tablet TAKE 1 TABLET BY MOUTH DAILY  . aspirin EC 81 MG tablet Take 81 mg by mouth daily.  . B-D ULTRAFINE III SHORT PEN 31G X 8 MM MISC USE WITH PEN TWICE DAILY AS  DIRECTED  . Colchicine 0.6 MG CAPS Take 1 capsule by mouth daily.  Marland Kitchen docusate sodium (COLACE) 100 MG capsule Take 100 mg at bedtime by mouth.   . Finerenone (KERENDIA) 20 MG TABS Take 1 tablet (20 mg total) by mouth daily in the afternoon.  . furosemide (LASIX) 20 MG tablet TAKE 1 TABLET BY MOUTH TWICE  DAILY  . insulin degludec (TRESIBA FLEXTOUCH) 200 UNIT/ML FlexTouch Pen Inject 130 Units into the skin 2 (two) times daily.  . isosorbide mononitrate (IMDUR) 60 MG 24 hr tablet Take 1 tablet by mouth once daily  . JARDIANCE 25 MG TABS tablet TAKE 1 TABLET BY MOUTH EVERY  MORNING  . levothyroxine (SYNTHROID) 150 MCG tablet TAKE 1 TABLET BY MOUTH DAILY  BEFORE BREAKFAST  . lisinopril (ZESTRIL) 40 MG tablet TAKE 1 TABLET BY MOUTH DAILY  . metoprolol (TOPROL-XL) 200 MG 24 hr tablet TAKE 1 TABLET BY MOUTH DAILY  . nitroGLYCERIN (NITROSTAT) 0.4 MG SL tablet Place 0.4 mg under the tongue every 5 (five) minutes as needed for chest pain.  Marland Kitchen olopatadine (PATANOL) 0.1 % ophthalmic solution Place 1 drop into both eyes daily.  . prasugrel (EFFIENT) 10 MG TABS tablet TAKE 1 TABLET BY MOUTH ONCE  DAILY  . ranolazine (RANEXA) 500 MG 12 hr tablet TAKE 1 TABLET BY MOUTH TWICE  DAILY  . rosuvastatin (CRESTOR) 20 MG tablet TAKE 1 TABLET BY MOUTH DAILY  . Blood Glucose Monitoring Suppl (ACCU-CHEK GUIDE) w/Device KIT USE WITH SUPPLIES TO CHECK BLOOD SUGAR 3 TIMES DAILY (Patient not taking: Reported on 08/06/2023)  . ezetimibe (ZETIA) 10 MG tablet Take 1 tablet by mouth once daily  . fluticasone (FLONASE) 50 MCG/ACT nasal spray Place 1  spray into both nostrils daily. (Patient not taking: Reported on 08/06/2023)   No facility-administered medications prior to visit.    Review of Systems  All other systems reviewed and are negative.      Objective:   BP 118/72   Pulse 65   Temp (!) 96.7 F (35.9 C)   Ht 5\' 7"  (1.702 m)   Wt 270 lb (122.5 kg)   SpO2 97%   BMI 42.29 kg/m   Vitals:   08/06/23 0950  BP: 118/72  Pulse: 65  Temp: (!) 96.7 F (35.9 C)  Height: 5\' 7"  (1.702 m)  Weight: 270 lb (122.5 kg)  SpO2: 97%  BMI (Calculated): 42.28    Physical Exam Vitals reviewed.  Constitutional:      Appearance: Normal appearance. He is obese.  HENT:     Head: Normocephalic.     Left Ear: There is no impacted cerumen.     Nose: Nose normal.     Mouth/Throat:     Mouth: Mucous membranes are moist.     Pharynx: No posterior oropharyngeal erythema.  Eyes:     Extraocular Movements: Extraocular movements intact.     Pupils: Pupils are equal, round, and reactive to light.  Cardiovascular:     Rate and Rhythm: Regular rhythm.     Chest Wall: PMI is not displaced.     Pulses: Normal pulses.     Heart sounds: Normal heart sounds. No murmur heard. Pulmonary:     Effort: Pulmonary effort is normal.     Breath sounds: Normal air entry. No rhonchi or rales.  Abdominal:     General: Abdomen is flat. Bowel sounds are normal. There is no distension.     Palpations: Abdomen is soft. There is no hepatomegaly, splenomegaly or mass.     Tenderness: There is no abdominal tenderness.  Musculoskeletal:        General: Normal range of motion.     Cervical back: Normal range of motion and neck supple.     Right lower leg: No edema.     Left lower leg: No edema.  Skin:    General: Skin is warm and dry.  Neurological:     General: No focal deficit present.     Mental Status: He is alert and oriented to person, place, and time.     Cranial Nerves: No cranial nerve deficit.     Motor: No weakness.  Psychiatric:         Mood and Affect: Mood normal.        Behavior: Behavior normal.     Results for orders placed or performed in visit on 08/06/23  POCT CBG (Fasting - Glucose)  Result Value Ref Range   Glucose Fasting, POC 122 (A) 70 - 99 mg/dL        Assessment & Plan:  As per problem list  Problem List Items Addressed This Visit       Endocrine   Diabetes mellitus type 2, uncomplicated (HCC) - Primary   Relevant Orders   POCT CBG (Fasting - Glucose) (Completed)   Other Visit Diagnoses       Laryngitis           No follow-ups on file.   Total time spent: 20 minutes  Luna Fuse, MD  08/06/2023   This document may have been prepared by [facility] Voice Recognition software and as such may include unintentional dictation errors.

## 2023-08-06 NOTE — Progress Notes (Signed)
Patient informed before he left for the day

## 2023-08-13 DIAGNOSIS — M0580 Other rheumatoid arthritis with rheumatoid factor of unspecified site: Secondary | ICD-10-CM | POA: Diagnosis not present

## 2023-08-13 DIAGNOSIS — I10 Essential (primary) hypertension: Secondary | ICD-10-CM | POA: Diagnosis not present

## 2023-08-13 DIAGNOSIS — G4733 Obstructive sleep apnea (adult) (pediatric): Secondary | ICD-10-CM | POA: Diagnosis not present

## 2023-08-13 DIAGNOSIS — E038 Other specified hypothyroidism: Secondary | ICD-10-CM | POA: Diagnosis not present

## 2023-08-13 DIAGNOSIS — N1831 Chronic kidney disease, stage 3a: Secondary | ICD-10-CM | POA: Diagnosis not present

## 2023-08-13 DIAGNOSIS — E785 Hyperlipidemia, unspecified: Secondary | ICD-10-CM | POA: Diagnosis not present

## 2023-08-13 DIAGNOSIS — I2510 Atherosclerotic heart disease of native coronary artery without angina pectoris: Secondary | ICD-10-CM | POA: Diagnosis not present

## 2023-08-13 DIAGNOSIS — I509 Heart failure, unspecified: Secondary | ICD-10-CM | POA: Diagnosis not present

## 2023-08-13 DIAGNOSIS — I639 Cerebral infarction, unspecified: Secondary | ICD-10-CM | POA: Diagnosis not present

## 2023-08-13 DIAGNOSIS — E1122 Type 2 diabetes mellitus with diabetic chronic kidney disease: Secondary | ICD-10-CM | POA: Diagnosis not present

## 2023-08-15 DIAGNOSIS — I872 Venous insufficiency (chronic) (peripheral): Secondary | ICD-10-CM | POA: Diagnosis not present

## 2023-08-15 DIAGNOSIS — I2510 Atherosclerotic heart disease of native coronary artery without angina pectoris: Secondary | ICD-10-CM | POA: Diagnosis not present

## 2023-08-15 DIAGNOSIS — Z91041 Radiographic dye allergy status: Secondary | ICD-10-CM | POA: Diagnosis not present

## 2023-08-15 DIAGNOSIS — S069XAA Unspecified intracranial injury with loss of consciousness status unknown, initial encounter: Secondary | ICD-10-CM | POA: Diagnosis not present

## 2023-08-15 DIAGNOSIS — R6889 Other general symptoms and signs: Secondary | ICD-10-CM | POA: Diagnosis not present

## 2023-08-15 DIAGNOSIS — I499 Cardiac arrhythmia, unspecified: Secondary | ICD-10-CM | POA: Diagnosis not present

## 2023-08-15 DIAGNOSIS — Z7982 Long term (current) use of aspirin: Secondary | ICD-10-CM | POA: Diagnosis not present

## 2023-08-15 DIAGNOSIS — I110 Hypertensive heart disease with heart failure: Secondary | ICD-10-CM | POA: Diagnosis not present

## 2023-08-15 DIAGNOSIS — R069 Unspecified abnormalities of breathing: Secondary | ICD-10-CM | POA: Diagnosis not present

## 2023-08-15 DIAGNOSIS — R58 Hemorrhage, not elsewhere classified: Secondary | ICD-10-CM | POA: Diagnosis not present

## 2023-08-15 DIAGNOSIS — S0990XA Unspecified injury of head, initial encounter: Secondary | ICD-10-CM | POA: Diagnosis not present

## 2023-08-15 DIAGNOSIS — J449 Chronic obstructive pulmonary disease, unspecified: Secondary | ICD-10-CM | POA: Diagnosis not present

## 2023-08-15 DIAGNOSIS — I469 Cardiac arrest, cause unspecified: Secondary | ICD-10-CM | POA: Diagnosis not present

## 2023-08-15 DIAGNOSIS — I252 Old myocardial infarction: Secondary | ICD-10-CM | POA: Diagnosis not present

## 2023-08-15 DIAGNOSIS — Z79899 Other long term (current) drug therapy: Secondary | ICD-10-CM | POA: Diagnosis not present

## 2023-08-15 DIAGNOSIS — I509 Heart failure, unspecified: Secondary | ICD-10-CM | POA: Diagnosis not present

## 2023-08-15 DIAGNOSIS — S299XXA Unspecified injury of thorax, initial encounter: Secondary | ICD-10-CM | POA: Diagnosis not present

## 2023-08-19 ENCOUNTER — Other Ambulatory Visit: Payer: Self-pay

## 2023-08-20 ENCOUNTER — Other Ambulatory Visit: Payer: Self-pay

## 2023-08-21 ENCOUNTER — Other Ambulatory Visit: Payer: Self-pay

## 2023-08-26 ENCOUNTER — Other Ambulatory Visit: Payer: Self-pay | Admitting: Internal Medicine

## 2023-09-07 DEATH — deceased

## 2023-10-24 ENCOUNTER — Ambulatory Visit: Payer: 59 | Admitting: Cardiovascular Disease

## 2023-10-30 ENCOUNTER — Ambulatory Visit: Payer: 59 | Admitting: Internal Medicine
